# Patient Record
Sex: Female | Born: 1980 | Race: Black or African American | Hispanic: No | Marital: Married | Smoking: Current every day smoker
Health system: Southern US, Community
[De-identification: ages and names within clinical notes are randomized; demographics above are authoritative.]

## PROBLEM LIST (undated history)

## (undated) ENCOUNTER — Ambulatory Visit: Payer: Self-pay | Source: Home / Self Care

## (undated) DIAGNOSIS — B20 Human immunodeficiency virus [HIV] disease: Secondary | ICD-10-CM

## (undated) DIAGNOSIS — R87619 Unspecified abnormal cytological findings in specimens from cervix uteri: Secondary | ICD-10-CM

## (undated) DIAGNOSIS — F32A Depression, unspecified: Secondary | ICD-10-CM

## (undated) DIAGNOSIS — G629 Polyneuropathy, unspecified: Secondary | ICD-10-CM

## (undated) DIAGNOSIS — G43909 Migraine, unspecified, not intractable, without status migrainosus: Secondary | ICD-10-CM

## (undated) DIAGNOSIS — D219 Benign neoplasm of connective and other soft tissue, unspecified: Secondary | ICD-10-CM

## (undated) DIAGNOSIS — B1910 Unspecified viral hepatitis B without hepatic coma: Secondary | ICD-10-CM

## (undated) DIAGNOSIS — J45909 Unspecified asthma, uncomplicated: Secondary | ICD-10-CM

## (undated) DIAGNOSIS — F410 Panic disorder [episodic paroxysmal anxiety] without agoraphobia: Secondary | ICD-10-CM

## (undated) DIAGNOSIS — E559 Vitamin D deficiency, unspecified: Secondary | ICD-10-CM

## (undated) DIAGNOSIS — Z973 Presence of spectacles and contact lenses: Secondary | ICD-10-CM

## (undated) DIAGNOSIS — F329 Major depressive disorder, single episode, unspecified: Secondary | ICD-10-CM

## (undated) HISTORY — DX: Vitamin D deficiency, unspecified: E55.9

## (undated) HISTORY — DX: Unspecified viral hepatitis B without hepatic coma: B19.10

## (undated) HISTORY — DX: Migraine, unspecified, not intractable, without status migrainosus: G43.909

## (undated) HISTORY — PX: BREAST EXCISIONAL BIOPSY: SUR124

## (undated) HISTORY — DX: Major depressive disorder, single episode, unspecified: F32.9

## (undated) HISTORY — DX: Unspecified abnormal cytological findings in specimens from cervix uteri: R87.619

## (undated) HISTORY — DX: Panic disorder (episodic paroxysmal anxiety): F41.0

## (undated) HISTORY — DX: Human immunodeficiency virus (HIV) disease: B20

## (undated) HISTORY — PX: APPENDECTOMY: SHX54

## (undated) HISTORY — DX: Depression, unspecified: F32.A

## (undated) HISTORY — DX: Polyneuropathy, unspecified: G62.9

---

## 2003-07-30 DIAGNOSIS — B20 Human immunodeficiency virus [HIV] disease: Secondary | ICD-10-CM

## 2003-07-30 DIAGNOSIS — Z21 Asymptomatic human immunodeficiency virus [HIV] infection status: Secondary | ICD-10-CM

## 2003-07-30 HISTORY — DX: Asymptomatic human immunodeficiency virus (hiv) infection status: Z21

## 2003-07-30 HISTORY — DX: Human immunodeficiency virus (HIV) disease: B20

## 2008-11-11 DIAGNOSIS — B20 Human immunodeficiency virus [HIV] disease: Secondary | ICD-10-CM | POA: Insufficient documentation

## 2009-10-27 DIAGNOSIS — N920 Excessive and frequent menstruation with regular cycle: Secondary | ICD-10-CM | POA: Insufficient documentation

## 2010-01-25 DIAGNOSIS — F419 Anxiety disorder, unspecified: Secondary | ICD-10-CM | POA: Insufficient documentation

## 2010-01-25 DIAGNOSIS — F32A Depression, unspecified: Secondary | ICD-10-CM | POA: Insufficient documentation

## 2010-01-25 DIAGNOSIS — F329 Major depressive disorder, single episode, unspecified: Secondary | ICD-10-CM | POA: Insufficient documentation

## 2010-06-15 DIAGNOSIS — E559 Vitamin D deficiency, unspecified: Secondary | ICD-10-CM | POA: Insufficient documentation

## 2010-07-29 DIAGNOSIS — G629 Polyneuropathy, unspecified: Secondary | ICD-10-CM

## 2010-07-29 HISTORY — DX: Polyneuropathy, unspecified: G62.9

## 2010-08-15 DIAGNOSIS — R102 Pelvic and perineal pain unspecified side: Secondary | ICD-10-CM | POA: Insufficient documentation

## 2011-05-28 DIAGNOSIS — G629 Polyneuropathy, unspecified: Secondary | ICD-10-CM | POA: Insufficient documentation

## 2014-09-14 DIAGNOSIS — Z113 Encounter for screening for infections with a predominantly sexual mode of transmission: Secondary | ICD-10-CM | POA: Insufficient documentation

## 2014-12-14 DIAGNOSIS — F418 Other specified anxiety disorders: Secondary | ICD-10-CM | POA: Insufficient documentation

## 2015-01-11 DIAGNOSIS — F1210 Cannabis abuse, uncomplicated: Secondary | ICD-10-CM | POA: Insufficient documentation

## 2015-11-15 DIAGNOSIS — B1910 Unspecified viral hepatitis B without hepatic coma: Secondary | ICD-10-CM | POA: Insufficient documentation

## 2017-05-12 ENCOUNTER — Ambulatory Visit (INDEPENDENT_AMBULATORY_CARE_PROVIDER_SITE_OTHER): Payer: Self-pay | Admitting: Infectious Diseases

## 2017-05-12 ENCOUNTER — Encounter: Payer: Self-pay | Admitting: Infectious Diseases

## 2017-05-12 VITALS — BP 119/80 | HR 80 | Temp 97.7°F | Wt 129.0 lb

## 2017-05-12 DIAGNOSIS — E559 Vitamin D deficiency, unspecified: Secondary | ICD-10-CM

## 2017-05-12 DIAGNOSIS — F331 Major depressive disorder, recurrent, moderate: Secondary | ICD-10-CM

## 2017-05-12 DIAGNOSIS — B20 Human immunodeficiency virus [HIV] disease: Secondary | ICD-10-CM

## 2017-05-12 DIAGNOSIS — R768 Other specified abnormal immunological findings in serum: Secondary | ICD-10-CM

## 2017-05-12 DIAGNOSIS — Z113 Encounter for screening for infections with a predominantly sexual mode of transmission: Secondary | ICD-10-CM

## 2017-05-12 MED ORDER — AMITRIPTYLINE HCL 25 MG PO TABS: 25.0000 mg | ORAL_TABLET | Freq: Every day | ORAL | 5 refills | Status: DC

## 2017-05-12 MED ORDER — ELVITEG-COBIC-EMTRICIT-TENOFAF 150-150-200-10 MG PO TABS: 1.0000 | ORAL_TABLET | Freq: Every day | ORAL | 5 refills | Status: DC

## 2017-05-12 NOTE — Assessment & Plan Note (Addendum)
Will check resistance today including integrase as she has had 2 periods of time off medications. Will restart her Genvoya for now as she liked this medication regimen and seemed to be adherent when she was taking it.

## 2017-05-12 NOTE — Progress Notes (Signed)
Brief Narrative: Angelica Hopkins is a HIV + female that is here to transfer care for her infection. Previously she was treated at [facility] under the care of Angelica Hopkins, Angelica Hopkins. Originally diagnosed with HIV in 2005. Deferred treatment with ART x 5 years (denial). Re-engaged in care 09/2008; History of OIs: none. She is a 2nd grade teacher currently. Separated from her husband.   PCP - Patient, No Pcp Per    Patient Active Problem List   Diagnosis Date Noted  . Hepatitis B core antibody positive 11/15/2015  . Major depressive disorder, recurrent, moderate ([place]) 12/14/2014  . Neuropathy 05/28/2011  . Vitamin D deficiency 06/15/2010  . Excessive and frequent menstruation 10/27/2009  . HIV (human immunodeficiency virus infection) ([place]) 11/11/2008    Patient's Medications  New Prescriptions   ELVITEGRAVIR-COBICISTAT-EMTRICITABINE-TENOFOVIR (GENVOYA) 150-150-200-10 MG TABS TABLET    Take 1 tablet by mouth daily with breakfast. Try to take at the same time each day with small amount of food  Previous Medications   No medications on file  Modified Medications   Modified Medication Previous Medication   AMITRIPTYLINE (ELAVIL) 25 MG TABLET amitriptyline (ELAVIL) 25 MG tablet      Take 1 tablet (25 mg total) by mouth at bedtime.    Take 25 mg by mouth at bedtime.  Discontinued Medications   No medications on file    Subjective: Angelica Hopkins presents to clinic today to transfer care for her HIV infection.  HPI:  HIV =  Stopped taking Genvoya about June 2018 when she switched her job and insurance ran out. Previous regimen included Complera; also reports being on 3 pill regimen at some point. With original diagnosis she admits she was in a denial and waited until 2010 to get back on medications. History of female partners only, no IVDU. Was previously undetectable with labs from 05/2016 and 10/2016.   Chronic Hepatitis B Infection = + surface Ag and Core Ab 2009. Hep B DNA  undetectable in 2018. Denies fevers, abdominal pain, bloating, changes to skin/eyes, urine or stool. No tattoos. Reports her brother may have Hep B but does not think her mother has it.   Migraines = takes amitriptyline every 2-3 days as she is concerned she will lose insurance and have to stop taking this all together, which has happened in the past and sent her into a bad depression. Goody's BC powder are the only OTC aid that helps to abort episodes. Frequency is about 1x/week.   Depression/Anxiety = reports that this is relatively uncontrolled and could benefit from some 'talk therapy.' Previous provider prescribed her Buspar to which she did not want to take with amitriptyline for fear of side effects and never picked up. Smokes marijuana daily to deal with her stress.   Health Maintenance = She smokes cigarettes daily (1/3 ppd), smokes marijuana daily, does not drink excessive alcohol. No formal exercise. History of LSIL s/p colposcopy 07/2010 - negative paps since. In need of pap smear now.   Review of Systems: Review of Systems  Constitutional: Negative for chills, fever, malaise/fatigue and weight loss.  HENT: Negative for sore throat.        No dental problems  Respiratory: Negative for cough and sputum production.   Cardiovascular: Negative for chest pain and leg swelling.  Gastrointestinal: Negative for abdominal pain, diarrhea and vomiting.  Genitourinary: Negative for dysuria and flank pain.  Musculoskeletal: Negative for joint pain, myalgias and neck pain.  Skin: Negative for rash.  Neurological: Positive for headaches. Negative for dizziness and tingling.  Psychiatric/Behavioral: Positive for depression and substance abuse. The patient has insomnia. The patient is not nervous/anxious.     Past Medical History:  Diagnosis Date  . Abnormal Pap smear of cervix    LSISL, colposcopy 2012  . Depression   . Hepatitis B infection   . HIV (human immunodeficiency virus infection)  ([place])   . 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 Panic disorder   . Vitamin D deficiency     Social History  Substance Use Topics  . Smoking status: Current Every Day Smoker    Packs/day: 0.50  . Smokeless tobacco: Never Used  . Alcohol use No    Family History  Problem Relation Age of Onset  . Uterine cancer Maternal Aunt   . Lung cancer Maternal Grandmother     No Known Allergies  Objective:  Vitals:   05/12/17 1414  BP: 119/80  Pulse: 80  Temp: 97.7 F (36.5 C)  TempSrc: Oral  Weight: 129 lb (58.5 kg)   There is no height or weight on file to calculate BMI.  Physical Exam  Constitutional: She is oriented to person, place, and time and well-developed, well-nourished, and in no distress.  HENT:  Mouth/Throat: No oral lesions. No dental abscesses.  Cardiovascular: Normal rate, regular rhythm and normal heart sounds.   Pulmonary/Chest: Effort normal and breath sounds normal.  Abdominal: Soft. She exhibits no distension. There is no tenderness.  Musculoskeletal: Normal range of motion. She exhibits no tenderness.  Lymphadenopathy:    She has no cervical adenopathy.  Neurological: She is alert and oriented to person, place, and time.  Skin: Skin is warm and dry. No rash noted.  Psychiatric: Mood, affect and judgment normal.      Problem List Items Addressed This Visit      Other   Hepatitis B core antibody positive    No signs of active infection or flare since withdrawing HIV medications a few months ago. Check labs today including HepB eAg/Ab, LFTs and DNA viral load. Precautions given to prevent transmission if active infection.       HIV (human immunodeficiency virus infection) ([place]) - Primary    Will check resistance today including integrase as she has had 2 periods of time off medications. Will restart her Genvoya for now as she liked this medication regimen and seemed to be adherent when she was taking it.       Relevant Medications    elvitegravir-cobicistat-emtricitabine-tenofovir (GENVOYA) 150-150-200-10 MG TABS tablet   Other Relevant Orders   HIV RNA, RTPCR W/R GT (RTI, PI,INT)   T-helper cell (CD4)- ([facility] clinic only) (Completed)   RPR (Completed)   COMPLETE METABOLIC PANEL WITH GFR (Completed)   CBC with Differential/Platelet (Completed)   Lipid panel (Completed)   HLA B*5701   Quantiferon tb gold assay (blood)   hCG, quantitative, pregnancy (Completed)   Hepatitis B surface antigen (Completed)   Hepatitis B surface antibody (Completed)   Hepatitis A antibody, total (Completed)   Hepatitis C antibody (Completed)   Hepatitis B e antigen   Hepatitis B DNA, Ultraquantitative, PCR   Hepatitis B e antigen   Urinalysis, Routine w reflex microscopic (Completed)   Major depressive disorder, recurrent, moderate (HCC)    She seems to have had a significant treatment history and is self medicating significantly with many marijuana daily. Will have her meet with Judeen Hammans for talk therapy and recommendations. I feel she would be best  served at a psychiatrist after looking through her records. She is not interested in stopping or decreasing her marijuana use .      Relevant Medications   amitriptyline (ELAVIL) 25 MG tablet   Vitamin D deficiency    Check vitamin d level today. Not currently on therapy.       Relevant Orders   Vitamin D 1,25 dihydroxy    Other Visit Diagnoses    Screen for STD (sexually transmitted disease)       Relevant Orders   RPR (Completed)   Urine cytology ancillary only      Janene Madeira, MSN, NP-C [facility] for Infectious [place] Pager: [phone]  05/13/17 5:19 PM

## 2017-05-12 NOTE — Assessment & Plan Note (Signed)
Check vitamin d level today. Not currently on therapy.

## 2017-05-12 NOTE — Patient Instructions (Addendum)
Can take allegra, claritin or zyrtec for allergies. Use the "D" version if you are having congestion.   Getting you started back on Genvoya today - take this every day with full meal.   Labs today.   Please sign up with MyChart to access your labs and set up email communication with our clinic for non-urgent medical concerns.   Please set up appointment for pap smear - you can wait until your next appointment if you like.

## 2017-05-13 ENCOUNTER — Encounter: Payer: Self-pay | Admitting: Infectious Diseases

## 2017-05-13 LAB — URINALYSIS, ROUTINE W REFLEX MICROSCOPIC
Bilirubin Urine: NEGATIVE
Glucose, UA: NEGATIVE
Hgb urine dipstick: NEGATIVE
Ketones, ur: NEGATIVE
Leukocytes, UA: NEGATIVE
Nitrite: NEGATIVE
Protein, ur: NEGATIVE
Specific Gravity, Urine: 1.016 (ref 1.001–1.03)
pH: 6.5 (ref 5.0–8.0)

## 2017-05-13 LAB — T-HELPER CELL (CD4) - (RCID CLINIC ONLY)
CD4 % Helper T Cell: 26 % — ABNORMAL LOW (ref 33–55)
CD4 T Cell Abs: 520 /uL (ref 400–2700)

## 2017-05-13 NOTE — Assessment & Plan Note (Addendum)
She seems to have had a significant treatment history and is self medicating significantly with many marijuana daily. Will have her meet with Judeen Hammans for talk therapy and recommendations. I feel she would be best served at a psychiatrist after looking through her records. She is not interested in stopping or decreasing her marijuana use .

## 2017-05-13 NOTE — Assessment & Plan Note (Addendum)
No signs of active infection or flare since withdrawing HIV medications a few months ago. Check labs today including HepB eAg/Ab, LFTs and DNA viral load. Precautions given to prevent transmission if active infection.

## 2017-05-14 LAB — URINE CYTOLOGY ANCILLARY ONLY
Chlamydia: NEGATIVE
Neisseria Gonorrhea: NEGATIVE

## 2017-05-15 ENCOUNTER — Telehealth: Payer: Self-pay | Admitting: *Deleted

## 2017-05-15 NOTE — Telephone Encounter (Signed)
Per Colletta Maryland called the patient and scheduled her for Monday 05/19/17 at 4 pm. She was a little concerned but I assured her Monday was fine and Colletta Maryland just wants to make sure all her answers are answered and to give her the time she needs instead of over the phone interactions.

## 2017-05-15 NOTE — Telephone Encounter (Signed)
-----   Message from Garland Callas, NP sent at 05/15/2017  8:22 AM EDT ----- Can you please call her to schedule another appointment next week with me? Nothing urgent but it is too much to go over on the phone -  need to change her HIV medication, get further labs and discuss her hepatitis B infection as it has flared up. If you think she needs a call from me I will be happy to give her a call tomorrow afternoon. Thank you.

## 2017-05-15 NOTE — Telephone Encounter (Signed)
Excellent thank you for your kind and reassuring explanation.

## 2017-05-17 LAB — CBC WITH DIFFERENTIAL/PLATELET
Basophils Absolute: 9 cells/uL (ref 0–200)
Basophils Relative: 0.3 %
Eosinophils Absolute: 19 cells/uL (ref 15–500)
Eosinophils Relative: 0.6 %
HCT: 36 % (ref 35.0–45.0)
Hemoglobin: 11.8 g/dL (ref 11.7–15.5)
Lymphs Abs: 1832 cells/uL (ref 850–3900)
MCH: 27.3 pg (ref 27.0–33.0)
MCHC: 32.8 g/dL (ref 32.0–36.0)
MCV: 83.1 fL (ref 80.0–100.0)
MPV: 10.7 fL (ref 7.5–12.5)
Monocytes Relative: 7 %
Neutro Abs: 1023 cells/uL — ABNORMAL LOW (ref 1500–7800)
Neutrophils Relative %: 33 %
Platelets: 177 10*3/uL (ref 140–400)
RBC: 4.33 10*6/uL (ref 3.80–5.10)
RDW: 12.8 % (ref 11.0–15.0)
Total Lymphocyte: 59.1 %
WBC mixed population: 217 cells/uL (ref 200–950)
WBC: 3.1 10*3/uL — ABNORMAL LOW (ref 3.8–10.8)

## 2017-05-17 LAB — COMPLETE METABOLIC PANEL WITH GFR
AG Ratio: 1 (calc) (ref 1.0–2.5)
ALT: 52 U/L — ABNORMAL HIGH (ref 6–29)
AST: 70 U/L — ABNORMAL HIGH (ref 10–30)
Albumin: 4 g/dL (ref 3.6–5.1)
Alkaline phosphatase (APISO): 29 U/L — ABNORMAL LOW (ref 33–115)
BUN: 8 mg/dL (ref 7–25)
CO2: 28 mmol/L (ref 20–32)
Calcium: 9.1 mg/dL (ref 8.6–10.2)
Chloride: 103 mmol/L (ref 98–110)
Creat: 0.83 mg/dL (ref 0.50–1.10)
GFR, Est African American: 107 mL/min/{1.73_m2} (ref >=60)
GFR, Est Non African American: 93 mL/min/{1.73_m2} (ref >=60)
Globulin: 4.2 g/dL (calc) — ABNORMAL HIGH (ref 1.9–3.7)
Glucose, Bld: 81 mg/dL (ref 65–99)
Potassium: 4.1 mmol/L (ref 3.5–5.3)
Sodium: 137 mmol/L (ref 135–146)
Total Bilirubin: 0.4 mg/dL (ref 0.2–1.2)
Total Protein: 8.2 g/dL — ABNORMAL HIGH (ref 6.1–8.1)

## 2017-05-17 LAB — HEPATITIS B SURFACE ANTIBODY,QUALITATIVE: Hep B S Ab: NONREACTIVE

## 2017-05-17 LAB — LIPID PANEL
Cholesterol: 119 mg/dL (ref <200)
HDL: 45 mg/dL — ABNORMAL LOW (ref >50)
LDL Cholesterol (Calc): 58 mg/dL (calc)
Non-HDL Cholesterol (Calc): 74 mg/dL (calc) (ref <130)
Total CHOL/HDL Ratio: 2.6 (calc) (ref <5.0)
Triglycerides: 82 mg/dL (ref <150)

## 2017-05-17 LAB — HCG, QUANTITATIVE, PREGNANCY: HCG, Total, QN: <2 m[IU]/mL

## 2017-05-17 LAB — QUANTIFERON TB GOLD ASSAY (BLOOD)
Mitogen-Nil: >10 IU/mL
QUANTIFERON(R)-TB GOLD: NEGATIVE
Quantiferon Nil Value: 0.04 IU/mL
Quantiferon Tb Ag Minus Nil Value: 0 IU/mL

## 2017-05-17 LAB — HEPATITIS B SURFACE ANTIGEN: Hepatitis B Surface Ag: REACTIVE — AB

## 2017-05-17 LAB — VITAMIN D 1,25 DIHYDROXY
Vitamin D 1, 25 (OH)2 Total: 50 pg/mL (ref 18–72)
Vitamin D2 1, 25 (OH)2: <8 pg/mL
Vitamin D3 1, 25 (OH)2: 50 pg/mL

## 2017-05-17 LAB — HEPATITIS C ANTIBODY
Hepatitis C Ab: NONREACTIVE
SIGNAL TO CUT-OFF: 0.26 (ref <1.00)

## 2017-05-17 LAB — HEPATITIS B E ANTIGEN: Hep B E Ag: REACTIVE — AB

## 2017-05-17 LAB — HEPATITIS A ANTIBODY, TOTAL: Hepatitis A AB,Total: NONREACTIVE

## 2017-05-17 LAB — HLA B*5701: HLA-B*5701 w/rflx HLA-B High: NEGATIVE

## 2017-05-17 LAB — RPR: RPR Ser Ql: NONREACTIVE

## 2017-05-19 ENCOUNTER — Ambulatory Visit (INDEPENDENT_AMBULATORY_CARE_PROVIDER_SITE_OTHER): Payer: Self-pay | Admitting: Licensed Clinical Social Worker

## 2017-05-19 ENCOUNTER — Ambulatory Visit (INDEPENDENT_AMBULATORY_CARE_PROVIDER_SITE_OTHER): Payer: Self-pay | Admitting: Infectious Diseases

## 2017-05-19 ENCOUNTER — Encounter: Payer: Self-pay | Admitting: Infectious Diseases

## 2017-05-19 VITALS — BP 116/79 | HR 83 | Temp 97.6°F | Wt 128.0 lb

## 2017-05-19 DIAGNOSIS — B169 Acute hepatitis B without delta-agent and without hepatic coma: Secondary | ICD-10-CM

## 2017-05-19 DIAGNOSIS — F4321 Adjustment disorder with depressed mood: Secondary | ICD-10-CM

## 2017-05-19 DIAGNOSIS — IMO0002 Reserved for concepts with insufficient information to code with codable children: Secondary | ICD-10-CM

## 2017-05-19 DIAGNOSIS — B20 Human immunodeficiency virus [HIV] disease: Secondary | ICD-10-CM

## 2017-05-19 DIAGNOSIS — B180 Chronic viral hepatitis B with delta-agent: Secondary | ICD-10-CM

## 2017-05-19 DIAGNOSIS — F331 Major depressive disorder, recurrent, moderate: Secondary | ICD-10-CM

## 2017-05-19 DIAGNOSIS — Z915 Personal history of self-harm: Secondary | ICD-10-CM

## 2017-05-19 MED ORDER — BICTEGRAVIR-EMTRICITAB-TENOFOV 50-200-25 MG PO TABS: 1.0000 | ORAL_TABLET | Freq: Every day | ORAL | 5 refills | Status: DC

## 2017-05-19 NOTE — Assessment & Plan Note (Signed)
Counseled on lab results. VL >90k and CD4 520. No OI proph needed. Educated on interpretation of her lab results. With her side effects to Genvoya I will change her to [facility] and request Advancing Access to supply her with 19month of medications until ADAP approved. Tenofovir component will cover HBV co-infection. She has been counseled on this medication including s/e and daily use/adherence. She will come back in 6 weeks for blood work to assess VL/CD4.
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
## 2017-05-19 NOTE — Assessment & Plan Note (Signed)
Introduced her to [place] today. She will likely need alternative anti-anxiety medication as I have asked her to decrease marijuana use for her overall liver health. She is very concerned about this. Will continue to work with her and help with treatment. Advised PCP/MH provider for assistance - Given information re: [place] today as she does not have coverage to seek care elsewhere.

## 2017-05-19 NOTE — Assessment & Plan Note (Signed)
HepB DNA 188 million copies. LFT's elevated which has not been the case for her in the past. Likely she has had a flare in setting of medication lapse. TAF component in [place] will cover hep b. Counseled today in clinic and answered all questions regarding follow up plan.   With new elevated LFTs and knowledge her mother suffered from possible liver issues leading to her death will obtain complete abdominal US with elastography. Have asked her to be careful with blood (ie: menstrual pads/tampons), not share razors/toothbrushes, and condoms with sexual encounters as she is infectious currently. I have asked her to decrease marijuana use to help with liver health.

## 2017-05-19 NOTE — Patient Instructions (Signed)
Do not take the Genvoya. Will start you on Biktarvy once a day instead.   Common side effects include headaches and diarrhea - OK to take ibuprofen for headaches sparingly and imodium for diarrhea. Call/send me a MyChart message if this does not go away after a few weeks or it is too much to tolerate at school.   Will have you back as originally scheduled with labs 2 weeks before.   Will arrange for an ultrasound of your liver - we will call you with this information.

## 2017-05-21 ENCOUNTER — Encounter: Payer: Self-pay | Admitting: Infectious Diseases

## 2017-05-22 ENCOUNTER — Encounter: Payer: Self-pay | Admitting: Infectious Diseases

## 2017-05-22 ENCOUNTER — Other Ambulatory Visit: Payer: Self-pay | Admitting: Infectious Diseases

## 2017-05-22 DIAGNOSIS — G43819 Other migraine, intractable, without status migrainosus: Secondary | ICD-10-CM

## 2017-05-22 LAB — HIV-1 RNA ULTRAQUANT REFLEX TO GENTYP+
HIV 1 RNA Quant: 90800 Copies/mL — ABNORMAL HIGH
HIV-1 RNA Quant, Log: 4.96 Log cps/mL — ABNORMAL HIGH

## 2017-05-22 LAB — HEPATITIS B DNA, ULTRAQUANTITATIVE, PCR
Hepatitis B DNA (Calc): 8.27 Log IU/mL — ABNORMAL HIGH
Hepatitis B DNA: 188000000 IU/mL — ABNORMAL HIGH

## 2017-05-22 LAB — RFLX HIV-1 INTEGRASE GENOTYPE: HIV-1 Genotype: DETECTED — AB

## 2017-05-22 MED ORDER — METOCLOPRAMIDE HCL 10 MG PO TABS: 10.0000 mg | ORAL_TABLET | Freq: Three times a day (TID) | ORAL | 0 refills | Status: DC | PRN

## 2017-05-23 NOTE — BH Specialist Note (Signed)
Integrated Behavioral Health Initial Visit  MRN: [id number] Name: Angelica Hopkins  Number of [place] Clinician visits:: 1/6 Session Start time: 4:25 pm  Session End time: 4:53 pm Total time: 28 mins  Type of Service: [place] Interpretor:No. Interpretor Name and Language: N/A   Warm Hand Off Completed.       SUBJECTIVE: Angelica Hopkins is a 36 y.o. female accompanied by self Patient was referred by Angelica Hopkins for being a new referral.  Patient reports the following symptoms/concerns: Patient reported that she was diagnosed with HIV in 2005 and has been compliant with medications until her insurance stopped when she became unemployed.  Patient reported that she is depressed about dating and sharing her status.  Patient has history of self-harm and past suicide attempt.  Patient reported currently having urges to cut but uses her brother to "talk it out".  Additionally patient is using coping strategies such as squeezing her hand together to manage the intensity of the urges to cut.  Reported last cutting on arms three years ago.  Patient attempted suicide by overdose after she was diagnosed in 2005.  She drove herself to the ED, was admitted and released after three days.  Patient denied that there has been other suicide attempts since then.  Patient denied current specific suicidal ideations, plan or intent but reported general thoughts of death.  Patient has history of mental health treatment three years ago in [place] but stopped attending due to transportation issues.  Duration of problem: past year; Severity of problem: mild  OBJECTIVE: Mood: Depressed and Affect: Appropriate Risk of harm to self or others: No plan to harm self or others  LIFE CONTEXT: Family and Social: Patient is from [place], [place] and has traveled around.  Patient has family in [place] and has friends and co-workers here.  Patient was married for  10 years and has been separated for a year and a half.  Patient is working on becoming divorced.  Patient has a 33 year old daughter. School/Work: Patient is working Biochemist, clinical. Self-Care: Patient is able to tend to her ADL's. Life Changes: Patient moved to [facility] in August 2018  ASSESSMENT: Patient is currently experiencing difficulty adjusting to dating while separated and has thoughts of self-harm and may benefit from behavioral health services, medication management, and a support group.  GOALS ADDRESSED: Patient will: 1. Reduce symptoms of: depression and thoughts of self-mutilation 2. Increase knowledge and/or ability of: coping skills, healthy habits and self-management skills  3. Demonstrate ability to: Increase healthy adjustment to current life circumstances, Increase adequate support systems for patient/family and Manage painful emotions  INTERVENTIONS: Interventions utilized: Supportive Counseling   PLAN: 1. Follow up with behavioral health clinician on : Monday Oct 29th at 3:30 pm  Sande Rives, [facility]

## 2017-05-26 ENCOUNTER — Encounter: Payer: Self-pay | Admitting: Infectious Diseases

## 2017-05-26 ENCOUNTER — Emergency Department (HOSPITAL_COMMUNITY)
Admission: EM | Admit: 2017-05-26 | Discharge: 2017-05-26 | Disposition: A | Discharge status: Home / Self Care | Payer: Self-pay | Attending: Emergency Medicine | Admitting: Emergency Medicine

## 2017-05-26 ENCOUNTER — Encounter (HOSPITAL_COMMUNITY): Payer: Self-pay | Admitting: Emergency Medicine

## 2017-05-26 ENCOUNTER — Ambulatory Visit: Payer: Self-pay | Admitting: Licensed Clinical Social Worker

## 2017-05-26 DIAGNOSIS — Z79899 Other long term (current) drug therapy: Secondary | ICD-10-CM | POA: Insufficient documentation

## 2017-05-26 DIAGNOSIS — B20 Human immunodeficiency virus [HIV] disease: Secondary | ICD-10-CM | POA: Insufficient documentation

## 2017-05-26 DIAGNOSIS — M791 Myalgia, unspecified site: Secondary | ICD-10-CM | POA: Insufficient documentation

## 2017-05-26 DIAGNOSIS — R69 Illness, unspecified: Secondary | ICD-10-CM

## 2017-05-26 DIAGNOSIS — R110 Nausea: Secondary | ICD-10-CM | POA: Insufficient documentation

## 2017-05-26 DIAGNOSIS — R070 Pain in throat: Secondary | ICD-10-CM | POA: Insufficient documentation

## 2017-05-26 DIAGNOSIS — J111 Influenza due to unidentified influenza virus with other respiratory manifestations: Secondary | ICD-10-CM

## 2017-05-26 DIAGNOSIS — F17210 Nicotine dependence, cigarettes, uncomplicated: Secondary | ICD-10-CM | POA: Insufficient documentation

## 2017-05-26 DIAGNOSIS — R509 Fever, unspecified: Secondary | ICD-10-CM | POA: Insufficient documentation

## 2017-05-26 LAB — RAPID STREP SCREEN (MED CTR MEBANE ONLY): Streptococcus, Group A Screen (Direct): NEGATIVE

## 2017-05-26 LAB — INFLUENZA PANEL BY PCR (TYPE A & B)
Influenza A By PCR: NEGATIVE
Influenza B By PCR: NEGATIVE

## 2017-05-26 MED ORDER — ACETAMINOPHEN 325 MG PO TABS
650.0000 mg | ORAL_TABLET | Freq: Once | ORAL | Status: AC | PRN
  Administered 2017-05-26: 650 mg via ORAL

## 2017-05-26 MED ORDER — ACETAMINOPHEN 325 MG PO TABS
ORAL_TABLET | ORAL | Status: AC
  Filled 2017-05-26: qty 2

## 2017-05-26 MED ORDER — ONDANSETRON 4 MG PO TBDP: 4.0000 mg | ORAL_TABLET | Freq: Three times a day (TID) | ORAL | 0 refills | Status: DC | PRN

## 2017-05-26 MED ORDER — OSELTAMIVIR PHOSPHATE 75 MG PO CAPS: 75.0000 mg | ORAL_CAPSULE | Freq: Two times a day (BID) | ORAL | 0 refills | Status: DC

## 2017-05-26 NOTE — ED Notes (Signed)
Patient states that pain in her throat has not changed, but overall pain has decreased since arrival.  Says throat is still sore.

## 2017-05-26 NOTE — Discharge Instructions (Signed)
The rapid strep test was negative.  The flu test was negative, but there is a level of known error with this test.  Your symptoms are consistent with a viral illness. Viruses do not require antibiotics. Treatment is symptomatic care and it is important to note that these symptoms may last for 7-14 days.   Hand washing: Wash your hands throughout the day, but especially before and after touching the face, using the restroom, sneezing, coughing, or touching surfaces that have been coughed or sneezed upon. Hydration: Symptoms will be intensified and complicated by dehydration. Dehydration can also extend the duration of symptoms. Drink plenty of fluids and get plenty of rest. You should be drinking at least half a liter of water an hour to stay hydrated. Electrolyte drinks are also encouraged. You should be drinking enough fluids to make your urine light yellow, almost clear. If this is not the case, you are not drinking enough water. Please note that some of the treatments indicated below will not be effective if you are not adequately hydrated. Pain or fever: Ibuprofen, Naproxen, or Tylenol for pain or fever.  Nausea/vomiting: Use the Zofran for nausea or vomiting.  Congestion: Plain Mucinex may help relieve congestion. Saline sinus rinses and saline nasal sprays may also help relieve congestion. If you do not have heart problems or an allergy to such medications, you may also try phenylephrine or Sudafed. Sore throat: Warm liquids or Chloraseptic spray may help soothe a sore throat. Gargle twice a day with a salt water solution made from a half teaspoon of salt in a cup of warm water.  Tamiflu: Tamiflu is being prescribed as a precaution against influenza.  Take this medication until finished. Follow up: Follow up with a primary care provider, as needed, for any future management of this issue. Return: Return to the ED for worsening symptoms.

## 2017-05-26 NOTE — ED Provider Notes (Signed)
[facility] EMERGENCY DEPARTMENT Provider Note   CSN: [id number] Arrival date & time: 05/26/17  1047     History   Chief Complaint Chief Complaint  Patient presents with  . Generalized Body Aches  . Sore Throat    HPI Angelica Hopkins is a 36 y.o. female.  HPI   Angelica Hopkins is a 36 y.o. female, with a history of HIV, hepatitis B, and depression, presenting to the ED with sore throat, body aches, fever, chills, and nausea beginning yesterday.  Sore throat is sharp, bilateral, 7/10, nonradiating.  Took 1 dose of over-the-counter "cold medication" without complete relief.  Denies cough, shortness of breath, chest pain, difficulty swallowing, rash, neck pain or stiffness, vomiting, diarrhea, abdominal pain, or any other complaints.  States she has been compliant with her antiretrovirals.  Last CD4 count was 520 and HIV quant was 90,800, both on May 12, 2017. Under care of Janene Madeira, NP with last visit 10/15 with follow up on lab results 10/22.    Past Medical History:  Diagnosis Date  . Abnormal Pap smear of cervix    LSISL, colposcopy 2012  . Depression   . Hepatitis B infection   . HIV (human immunodeficiency virus infection) ([place])   . 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 Panic disorder   . Vitamin D deficiency     Patient Active Problem List   Diagnosis Date Noted  . Hepatitis B infection 11/15/2015  . Major depressive disorder, recurrent, moderate ([place]) 12/14/2014  . Neuropathy 05/28/2011  . Vitamin D deficiency 06/15/2010  . Excessive and frequent menstruation 10/27/2009  . HIV (human immunodeficiency virus infection) ([place]) 11/11/2008    Past Surgical History:  Procedure Laterality Date  . APPENDECTOMY      OB History    No data available       Home Medications    Prior to Admission medications   Medication Sig Start Date End Date Taking? Authorizing Provider  albuterol (PROVENTIL) (5 MG/ML) 0.5% nebulizer solution Inhale  2.5 mg into the lungs every 6 (six) hours as needed for shortness of breath or wheezing.   Yes [provider]  amitriptyline (ELAVIL) 25 MG tablet Take 1 tablet (25 mg total) by mouth at bedtime. 05/12/17  Yes [place] Callas, NP  metoCLOPramide (REGLAN) 10 MG tablet Take 1 tablet (10 mg total) by mouth every 8 (eight) hours as needed for nausea. 05/22/17  Yes [place] Callas, NP  Nutritional Supplements (COLD AND FLU) THERAPY PACK MISC Take 1 each by mouth every 6 (six) hours as needed (cold symptoms).   Yes [provider]  bictegravir-emtricitabine-tenofovir AF (BIKTARVY) 50-200-25 MG TABS tablet Take 1 tablet by mouth daily. Try to take at the same time each day with or without food. 05/19/17    Callas, NP  ondansetron (ZOFRAN ODT) 4 MG disintegrating tablet Take 1 tablet (4 mg total) by mouth every 8 (eight) hours as needed for nausea or vomiting. 05/26/17   Taitum Menton C, PA-C  oseltamivir (TAMIFLU) 75 MG capsule Take 1 capsule (75 mg total) by mouth every 12 (twelve) hours. 05/26/17   Lorayne Bender, PA-C    Family History Family History  Problem Relation Age of Onset  . Uterine cancer Maternal Aunt   . Lung cancer Maternal Grandmother   . Liver disease Mother   . HIV Mother   . Drug abuse Mother   . HIV Father   . Diabetes Father   .  Drug abuse Father   . Hepatitis B Brother     Social History Social History  Substance Use Topics  . Smoking status: Current Every Day Smoker    Packs/day: 0.30    Types: Cigarettes  . Smokeless tobacco: Never Used  . Alcohol use No     Allergies   Hydrocodone-acetaminophen   Review of Systems Review of Systems  Constitutional: Positive for chills and fever.  HENT: Positive for congestion and sore throat. Negative for trouble swallowing and voice change.   Respiratory: Negative for cough and shortness of breath.   Cardiovascular: Negative for chest pain.  Gastrointestinal: Positive for nausea. Negative  for abdominal pain, diarrhea and vomiting.  Musculoskeletal: Negative for neck pain and neck stiffness.  Skin: Negative for rash.  Neurological: Negative for dizziness, weakness and numbness.  All other systems reviewed and are negative.    Physical Exam Updated Vital Signs BP 113/89 (BP Location: Right Arm)   Pulse 98   Temp (!) 100.5 F (38.1 C) (Oral)   Resp 18   LMP 05/01/2017 (Approximate)   SpO2 100%   Physical Exam  Constitutional: She appears well-developed and well-nourished. No distress.  HENT:  Head: Normocephalic and atraumatic.  Mouth/Throat: Uvula is midline. Posterior oropharyngeal erythema present.  Eyes: Conjunctivae are normal.  Neck: Normal range of motion. Neck supple. No Brudzinski's sign and no Kernig's sign noted.  Cardiovascular: Normal rate, regular rhythm, normal heart sounds and intact distal pulses.   Pulmonary/Chest: Effort normal and breath sounds normal. No respiratory distress.  Abdominal: Soft. There is no tenderness. There is no guarding.  Musculoskeletal: She exhibits no edema.  Lymphadenopathy:    She has no cervical adenopathy.  Neurological: She is alert.  Skin: Skin is warm and dry. Capillary refill takes less than 2 seconds. She is not diaphoretic.  Psychiatric: She has a normal mood and affect. Her behavior is normal.  Nursing note and vitals reviewed.    ED Treatments / Results  Labs (all labs ordered are listed, but only abnormal results are displayed) Labs Reviewed  RAPID STREP SCREEN (NOT AT [facility])  CULTURE, GROUP A STREP [facility])  INFLUENZA PANEL BY PCR (TYPE A & B)    EKG  EKG Interpretation None       Radiology No results found.  Procedures Procedures (including critical care time)  Medications Ordered in ED Medications  acetaminophen (TYLENOL) tablet 650 mg (650 mg Oral Given 05/26/17 1134)     Initial Impression / Assessment and Plan / ED Course  I have reviewed the triage vital signs and the nursing  notes.  Pertinent labs & imaging results that were available during my care of the patient were reviewed by me and considered in my medical decision making (see chart for details).      Patient presents with sore throat, body aches, and fever.  Rapid strep negative.  Suspicion for possible influenza, despite negative influenza testing.  No meningismus.  Doubt sepsis.  Patient is nontoxic appearing, not tachycardic, not tachypneic, not hypotensive, maintains SPO2 of 100% on room air, and is in no apparent distress. The patient was given instructions for home care as well as return precautions. Patient voices understanding of these instructions, accepts the plan, and is comfortable with discharge.   Findings and plan of care discussed with Sherwood Gambler, MD.   Vitals:   05/26/17 1132 05/26/17 1534  BP: 113/89 (!) 105/58  Pulse: 98 88  Resp: 18 19  Temp: (!) 100.5 F (38.1 C) (  S) 98.6 F (37 C)  TempSrc: Oral Oral  SpO2: 100% 100%     Final Clinical Impressions(s) / ED Diagnoses   Final diagnoses:  Influenza-like illness    New Prescriptions New Prescriptions   ONDANSETRON (ZOFRAN ODT) 4 MG DISINTEGRATING TABLET    Take 1 tablet (4 mg total) by mouth every 8 (eight) hours as needed for nausea or vomiting.   OSELTAMIVIR (TAMIFLU) 75 MG CAPSULE    Take 1 capsule (75 mg total) by mouth every 12 (twelve) hours.     Lorayne Bender, PA-C 05/26/17 1538    Sherwood Gambler, MD 05/26/17 [phone]

## 2017-05-26 NOTE — ED Notes (Signed)
Patient Alert and oriented X4. Stable and ambulatory. Patient verbalized understanding of the discharge instructions.  Patient belongings were taken by the patient.  

## 2017-05-26 NOTE — ED Triage Notes (Signed)
Pt to ER for evaluation of generalized body aches, chills, and sore throat onset yesterday. Temp 100.5 at triage, denies taking any medication today for fever, states pain with swallowing, tonsils swollen and red.

## 2017-05-27 ENCOUNTER — Ambulatory Visit (HOSPITAL_COMMUNITY)
Admission: RE | Admit: 2017-05-27 | Discharge: 2017-05-27 | Disposition: A | Discharge status: Home / Self Care | Payer: Self-pay | Source: Ambulatory Visit | Attending: Infectious Diseases | Admitting: Infectious Diseases

## 2017-05-27 ENCOUNTER — Telehealth: Payer: Self-pay | Admitting: Licensed Clinical Social Worker

## 2017-05-27 DIAGNOSIS — B180 Chronic viral hepatitis B with delta-agent: Secondary | ICD-10-CM | POA: Insufficient documentation

## 2017-05-27 NOTE — Telephone Encounter (Signed)
[facility] called patient about missed appointment yesterday and was informed that she was in the hospital and sick with the flu.  Patient was barely able to talk and stated that she wanted to call back to schedule an appointment.  Sande Rives, [facility]

## 2017-05-28 LAB — CULTURE, GROUP A STREP (THRC)

## 2017-05-30 ENCOUNTER — Encounter: Payer: Self-pay | Admitting: Licensed Clinical Social Worker

## 2017-06-05 ENCOUNTER — Encounter: Payer: Self-pay | Admitting: Infectious Diseases

## 2017-06-05 ENCOUNTER — Other Ambulatory Visit: Payer: Self-pay | Admitting: *Deleted

## 2017-06-05 DIAGNOSIS — B20 Human immunodeficiency virus [HIV] disease: Secondary | ICD-10-CM

## 2017-06-05 DIAGNOSIS — B180 Chronic viral hepatitis B with delta-agent: Secondary | ICD-10-CM

## 2017-06-05 MED ORDER — BICTEGRAVIR-EMTRICITAB-TENOFOV 50-200-25 MG PO TABS: 1.0000 | ORAL_TABLET | Freq: Every day | ORAL | 5 refills | Status: DC

## 2017-06-23 ENCOUNTER — Other Ambulatory Visit: Payer: Self-pay

## 2017-06-23 DIAGNOSIS — B20 Human immunodeficiency virus [HIV] disease: Secondary | ICD-10-CM

## 2017-06-23 DIAGNOSIS — B169 Acute hepatitis B without delta-agent and without hepatic coma: Secondary | ICD-10-CM

## 2017-06-23 DIAGNOSIS — B180 Chronic viral hepatitis B with delta-agent: Secondary | ICD-10-CM

## 2017-06-24 ENCOUNTER — Telehealth: Payer: Self-pay | Admitting: *Deleted

## 2017-06-24 ENCOUNTER — Other Ambulatory Visit: Payer: Self-pay

## 2017-06-24 LAB — COMPREHENSIVE METABOLIC PANEL
AG Ratio: 0.8 (calc) — ABNORMAL LOW (ref 1.0–2.5)
ALT: 26 U/L (ref 6–29)
AST: 34 U/L — ABNORMAL HIGH (ref 10–30)
Albumin: 3.8 g/dL (ref 3.6–5.1)
Alkaline phosphatase (APISO): 33 U/L (ref 33–115)
BUN: 14 mg/dL (ref 7–25)
CO2: 28 mmol/L (ref 20–32)
Calcium: 9.4 mg/dL (ref 8.6–10.2)
Chloride: 104 mmol/L (ref 98–110)
Creat: 0.96 mg/dL (ref 0.50–1.10)
Globulin: 4.9 g/dL (calc) — ABNORMAL HIGH (ref 1.9–3.7)
Glucose, Bld: 93 mg/dL (ref 65–99)
Potassium: 4.6 mmol/L (ref 3.5–5.3)
Sodium: 137 mmol/L (ref 135–146)
Total Bilirubin: 0.3 mg/dL (ref 0.2–1.2)
Total Protein: 8.7 g/dL — ABNORMAL HIGH (ref 6.1–8.1)

## 2017-06-24 LAB — HEPATITIS B E ANTIGEN: Hep B E Ag: REACTIVE — AB

## 2017-06-24 LAB — EXTRA LAV TOP TUBE

## 2017-06-24 NOTE — Telephone Encounter (Signed)
I checked a serum pregnancy test when I first met her in clinic which was negative 1 month ago. I have not heard otherwise from the patient but will follow up with her when I see her back.   She does have a history of Hep B and it flared recently after being off ART for a year - I have her on treatment for both currently which should be in my clinic notes/lab result notes if they need documentation.   Thank you Kennyth Lose

## 2017-06-24 NOTE — Telephone Encounter (Signed)
Call from [facility] asking if this office had any notes pertaining to active Hep B or pregnancy on patient. Advised that patient is a new transfer and she just had labs yesterday and they have not resulted. Nothing in provider notes mentioning pregnancy. Gave the date of her next appt which is 07/11/17 Myrtis Hopping

## 2017-06-25 ENCOUNTER — Telehealth: Payer: Self-pay | Admitting: Licensed Clinical Social Worker

## 2017-06-25 LAB — HIV-1 RNA QUANT-NO REFLEX-BLD
HIV 1 RNA Quant: 51 copies/mL — ABNORMAL HIGH
HIV-1 RNA Quant, Log: 1.71 Log copies/mL — ABNORMAL HIGH

## 2017-06-25 LAB — HEPATITIS B DNA, ULTRAQUANTITATIVE, PCR
Hepatitis B DNA (Calc): 6.96 Log IU/mL — ABNORMAL HIGH
Hepatitis B DNA: 9060000 IU/mL — ABNORMAL HIGH

## 2017-06-25 LAB — T-HELPER CELL (CD4) - (RCID CLINIC ONLY)
CD4 % Helper T Cell: 27 % — ABNORMAL LOW (ref 33–55)
CD4 T Cell Abs: 700 /uL (ref 400–2700)

## 2017-06-25 NOTE — Telephone Encounter (Signed)
[facility] called patient and left message regarding scheduling an appointment.  Sande Rives, [facility]

## 2017-06-26 LAB — LIVER FIBROSIS, FIBROTEST-ACTITEST
ALT: 28 U/L (ref 6–29)
Alpha-2-Macroglobulin: 257 mg/dL (ref 106–279)
Apolipoprotein A1: 140 mg/dL (ref 101–198)
Bilirubin: 0.2 mg/dL (ref 0.2–1.2)
Fibrosis Score: 0.08
GGT: 16 U/L (ref 3–50)
Haptoglobin: 133 mg/dL (ref 43–212)
Necroinflammat ACT Score: 0.1
Reference ID: 2224567

## 2017-07-07 ENCOUNTER — Ambulatory Visit: Payer: Self-pay | Admitting: Infectious Diseases

## 2017-07-10 ENCOUNTER — Ambulatory Visit: Payer: Self-pay | Admitting: Infectious Diseases

## 2017-07-14 ENCOUNTER — Encounter: Payer: Self-pay | Admitting: Infectious Diseases

## 2017-07-15 ENCOUNTER — Encounter: Payer: Self-pay | Admitting: Infectious Diseases

## 2017-07-15 ENCOUNTER — Ambulatory Visit (INDEPENDENT_AMBULATORY_CARE_PROVIDER_SITE_OTHER): Payer: Self-pay | Admitting: Infectious Diseases

## 2017-07-15 VITALS — Temp 98.3°F | Ht 63.0 in | Wt 135.5 lb

## 2017-07-15 DIAGNOSIS — B181 Chronic viral hepatitis B without delta-agent: Secondary | ICD-10-CM

## 2017-07-15 DIAGNOSIS — Z8669 Personal history of other diseases of the nervous system and sense organs: Secondary | ICD-10-CM

## 2017-07-15 DIAGNOSIS — R42 Dizziness and giddiness: Secondary | ICD-10-CM

## 2017-07-15 DIAGNOSIS — B20 Human immunodeficiency virus [HIV] disease: Secondary | ICD-10-CM

## 2017-07-15 DIAGNOSIS — F331 Major depressive disorder, recurrent, moderate: Secondary | ICD-10-CM

## 2017-07-15 MED ORDER — MECLIZINE HCL 32 MG PO TABS: 32.0000 mg | ORAL_TABLET | Freq: Two times a day (BID) | ORAL | 1 refills | Status: DC
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
## 2017-07-15 NOTE — Patient Instructions (Signed)
Work on Academic librarian intake first to help with the dizziness. This will help give you adequate blood volume to hopefully improve your symptoms.   I do worry this is partially due to vertigo - will give you prescription for meclizine to see if this taken 1-2 times a day will help.   Continue your Biktarvy.   Will have you back in 3 months with labs 2 weeks before.   Have a good holiday

## 2017-07-18 ENCOUNTER — Encounter: Payer: Self-pay | Admitting: Infectious Diseases

## 2017-07-18 DIAGNOSIS — R42 Dizziness and giddiness: Secondary | ICD-10-CM | POA: Insufficient documentation

## 2017-07-18 DIAGNOSIS — G43909 Migraine, unspecified, not intractable, without status migrainosus: Secondary | ICD-10-CM | POA: Insufficient documentation

## 2017-07-18 NOTE — Assessment & Plan Note (Signed)
?  Vertigo component vs under-hydrating. Slight nystagmus noted during side to side movement during [facility] maneuvers however not reproducible with quick lowering of head.   Will give a trial BID meclizine to see if this helps but first would like her to increase water intake as well until urine is pale yellow to clear to ensure adequately hydrated.

## 2017-07-18 NOTE — Assessment & Plan Note (Addendum)
F2 some F3 on elastography. HBeAg (+).   HBV DNA and ALT improved since resuming Biktarvy. Will continue to monitor q3-57m. Counseled on risk for reactivation of HBV if she stops her HIV medications again.   Will need annual [place] screening with likely perinatal infection and potential for liver cancer/cirrhosis that contributed to her mother's death.

## 2017-07-18 NOTE — Assessment & Plan Note (Signed)
Continue daily amitriptyline.

## 2017-07-18 NOTE — Assessment & Plan Note (Signed)
Excellent suppression of VL with Biktarvy. Counseled regarding medication adherence today. Safe sex counseling provided. Condoms offered/declined. Will have her return in 3 months to reassess HIV VL and CMET.

## 2017-07-18 NOTE — Assessment & Plan Note (Signed)
Stable currently. Still with excessive marijuana use. Will continue to discuss readiness to quit and need for SA counseling.

## 2017-08-06 ENCOUNTER — Telehealth: Payer: Self-pay | Admitting: Licensed Clinical Social Worker

## 2017-08-06 NOTE — Telephone Encounter (Signed)
[facility] received phone call from patient asking to schedule an appointment to manage anxiety.  Patient explained that she works until 3:30 pm but assured the [facility] that she would be present for the appointment before 3:45 pm.  [facility] scheduled the appointment with the agreement from the patient that she will be present before 3:45 pm.  Sande Rives, [facility]

## 2017-08-07 ENCOUNTER — Ambulatory Visit (INDEPENDENT_AMBULATORY_CARE_PROVIDER_SITE_OTHER): Payer: Self-pay | Admitting: Licensed Clinical Social Worker

## 2017-08-07 DIAGNOSIS — F411 Generalized anxiety disorder: Secondary | ICD-10-CM

## 2017-08-07 DIAGNOSIS — IMO0002 Reserved for concepts with insufficient information to code with codable children: Secondary | ICD-10-CM

## 2017-08-07 DIAGNOSIS — Z915 Personal history of self-harm: Secondary | ICD-10-CM

## 2017-08-07 DIAGNOSIS — F4321 Adjustment disorder with depressed mood: Secondary | ICD-10-CM

## 2017-08-12 NOTE — BH Specialist Note (Signed)
Integrated Behavioral Health Follow Up Visit  MRN: [id number] Name: Angelica Hopkins  Number of [place] Clinician visits: 2/6 Session Start time: 3:38 pm  Session End time: 4:38 pm Total time: 1 hour  Type of Service: [place] Interpretor:No. Interpretor Name and Language: N/A  SUBJECTIVE: Angelica Hopkins is a 37 y.o. female accompanied by self Patient was referred by Angelica Hopkins for being a new referral.  Patient completed the PHQ-9 assessment and scored a 7 (mild symptoms) and reported last experiencing issues with depression in 2016.  Patient also completed the GAD-7 assessment and scored a 16, evidencing severe symptoms that are "very difficult to manage".  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   Patient reported having low frustration tolerance but is not asking for additional help when needed.  Patient reported that she may have unmanaged ADHD symptoms with racing thoughts and smokes Marijuana to manage.  Patient reported that she smokes Marijuana daily to manage and has a history of smoking up to 4 blunts a day while in school.  Patient reported that she took the following medications in 2016, Prozac, Zoloft, Adderall, and Cymbalta.  Patient reported that the antidepressants made her feel that she is "under water" and she discontinued.  Patient is currently taking Amitriptyline for pain.  Patient has history of attending behavioral health services and was receiving counseling on campus of school from 2015-summer 2016.  She also reported that she attended [facility] counseling but did not like receiving services there.  In reviewing her symptoms, patient reported having a family history of mental illness and stated that she has two sisters with Bipolar D/O.  Patient stated that she did not think that she met criteria, however was  willing to review criteria.  With the exception of racing thoughts, patient was able to deny the experience of Bipolar criteria.  However she reported extensive worry and met all 6 criteria for GAD, which were experienced since the summer of 2017.  Patient reported that she had to step down from a work position due to anxiety.  Additionally patient reported that she smokes Marijuana 2-3 times a day, started smoking at age 87, with no current desire to stop.  Patient denied being high the next day; denied lack of fulfilling obligations, giving up activities, or having recurrent problems; denied driving under the influence; and denied continued use despite having a problem.  Patient is unwilling to enter drug treatment at this time.  [facility] and patient discussed her current coping strategies and the role of untreated mental illness on functioning.  [facility] educated patient about the role of journaling in managing excessive worry and distressing emotions, and educated on some helpful ways to journal.  Patient was receptive to the use of journaling and reported that she is going to begin.  Duration of problem: past year; Severity of problem: moderate  OBJECTIVE: Mood: elevated and Affect: Appropriate Risk of harm to self or others: No plan to harm self or others  Thought Process: Tangential Thought Content: Logical  ASSESSMENT: Patient is currently experiencing episodes of frustration, with a low frustration tolerance, and excessive worry and anxiety.  Patient is also using Marijuana as a coping mechanism to manage untreated anxiety.  Patient may benefit from behavioral health services, medication management, and a support group.   GOALS ADDRESSED: Patient will: 1.  Reduce symptoms of: anxiety, depression and Marijuana use  2.  Increase knowledge and/or ability of: coping skills, healthy habits and self-management skills  3.  Demonstrate ability to: Increase healthy adjustment to current life  circumstances, Increase adequate support systems for patient/family and Manage painful and distressing emotions  INTERVENTIONS: Interventions utilized:  Supportive Counseling and Medication Monitoring Standardized Assessments completed: GAD-7 and PHQ 9   GAD 7 : Generalized Anxiety Score 08/07/2017  Nervous, Anxious, on Edge 3  Control/stop worrying 2  Worry too much - different things 2  Trouble relaxing 3  Restless 3  Easily annoyed or irritable 3  Afraid - awful might happen 0  Total GAD 7 Score 16  Anxiety Difficulty Very difficult   PLAN: 1. Follow up with behavioral health clinician on : Thurs Jan 17th at 3:30 pm 2. Behavioral recommendations: Begin journaling throughout the day, but at very least at the end of the day before sleep. 3. During next session will educate on additional anxiety coping strategies for irritability and low frustration tolerance.   Sande Rives, [facility]

## 2017-08-14 ENCOUNTER — Ambulatory Visit (INDEPENDENT_AMBULATORY_CARE_PROVIDER_SITE_OTHER): Payer: Self-pay | Admitting: Licensed Clinical Social Worker

## 2017-08-14 DIAGNOSIS — F411 Generalized anxiety disorder: Secondary | ICD-10-CM

## 2017-08-14 DIAGNOSIS — IMO0002 Reserved for concepts with insufficient information to code with codable children: Secondary | ICD-10-CM

## 2017-08-14 DIAGNOSIS — Z915 Personal history of self-harm: Secondary | ICD-10-CM

## 2017-08-14 DIAGNOSIS — F4321 Adjustment disorder with depressed mood: Secondary | ICD-10-CM

## 2017-08-18 NOTE — BH Specialist Note (Signed)
Integrated Behavioral Health Follow Up Visit  MRN: [id number] Name: Angelica Hopkins  Number of [place] Clinician visits: 3/6 Session Start time: 3:49 pm  Session End time: 4:37 pm Total time: 48 mins  Type of Service: [place] Interpretor:No. Interpretor Name and Language: N/A  SUBJECTIVE: Angelica Hopkins is a 37 y.o. female accompanied by self Patient was referred by Janene Madeira for being a new referral.  Patient provided an update on her progress with journaling and reported that she is doing when she can remember to perform, and it helps her feel better.  [facility] provided patient with an Anxiety worksheet and discussed things that trigger anxiety and differentiated from feelings of frustration or anger.  With assistance from the [facility], patient made a list of the physical symptoms experienced when she is anxious and of possible self-soothing actions.  Patient also discussed the thoughts experienced when anxious (all destructive) and again differentiated from frustration.  [facility] helped patient develop a list of three things to cope with anxiety while working.  Duration of problem: past year; Severity of problem: moderate  OBJECTIVE: Mood: Anxious and Affect: Appropriate Risk of harm to self or others: No plan to harm self or others  Thought Process: Circumstantial Thought Content: Logical  ASSESSMENT: Patient is currently experiencing episodes of frustration, with a low frustration tolerance, and excessive worry and anxiety.  Patient is experiencing some symptom relief and reduction in anxiety due to using learned coping strategies.  Patient has also been receptive to learning and trying new behaviors and has been receptive to change.  Patient may benefit from continued behavioral health services, medication management, drug treatment, and a support group.  GOALS ADDRESSED: Patient will: 1.  Reduce symptoms of: anxiety and  Marijuana use  2.  Increase knowledge and/or ability of: coping skills, healthy habits and self-management skills  3.  Demonstrate ability to: Increase healthy adjustment to current life circumstances, Increase adequate support systems for patient/family and Manage painful and distressing emotions  INTERVENTIONS: Interventions utilized:  Brief CBT  PLAN: 1. Follow up with behavioral health clinician on : Thurs Jan 31 at 3:30 pm in two weeks 2. Behavioral recommendations: Patient will begin to use learned coping strategies of ripping paper to release/transfer strong emotion and use visualization of a favorite concert to create happy emotions.  Patient will continue and increase use of journaling throughout the day and at the end of the day. 3. At next session will review Challenging Anxious Thoughts and Countering Anxiety worksheets.   Sande Rives, [facility]

## 2017-08-28 ENCOUNTER — Ambulatory Visit: Payer: Self-pay | Admitting: Licensed Clinical Social Worker

## 2017-09-16 ENCOUNTER — Other Ambulatory Visit: Payer: Self-pay

## 2017-09-16 DIAGNOSIS — B181 Chronic viral hepatitis B without delta-agent: Secondary | ICD-10-CM

## 2017-09-16 DIAGNOSIS — B20 Human immunodeficiency virus [HIV] disease: Secondary | ICD-10-CM

## 2017-09-16 LAB — COMPREHENSIVE METABOLIC PANEL
AG Ratio: 1.2 (calc) (ref 1.0–2.5)
ALT: 16 U/L (ref 6–29)
AST: 23 U/L (ref 10–30)
Albumin: 4.2 g/dL (ref 3.6–5.1)
Alkaline phosphatase (APISO): 30 U/L — ABNORMAL LOW (ref 33–115)
BUN: 14 mg/dL (ref 7–25)
CO2: 28 mmol/L (ref 20–32)
Calcium: 9.6 mg/dL (ref 8.6–10.2)
Chloride: 105 mmol/L (ref 98–110)
Creat: 1.06 mg/dL (ref 0.50–1.10)
Globulin: 3.6 g/dL (calc) (ref 1.9–3.7)
Glucose, Bld: 88 mg/dL (ref 65–99)
Potassium: 4.6 mmol/L (ref 3.5–5.3)
Sodium: 138 mmol/L (ref 135–146)
Total Bilirubin: 0.2 mg/dL (ref 0.2–1.2)
Total Protein: 7.8 g/dL (ref 6.1–8.1)

## 2017-09-18 LAB — HIV-1 RNA QUANT-NO REFLEX-BLD
HIV 1 RNA Quant: <20 copies/mL
HIV-1 RNA Quant, Log: <1.3 Log copies/mL

## 2017-09-19 LAB — HEPATITIS B DNA, ULTRAQUANTITATIVE, PCR
Hepatitis B DNA (Calc): 4.8 Log IU/mL — ABNORMAL HIGH
Hepatitis B DNA: 62500 IU/mL — ABNORMAL HIGH

## 2017-09-30 ENCOUNTER — Ambulatory Visit (INDEPENDENT_AMBULATORY_CARE_PROVIDER_SITE_OTHER): Payer: Self-pay | Admitting: Infectious Diseases

## 2017-09-30 ENCOUNTER — Encounter: Payer: Self-pay | Admitting: Infectious Diseases

## 2017-09-30 VITALS — BP 132/80 | HR 91 | Temp 98.1°F | Wt 134.0 lb

## 2017-09-30 DIAGNOSIS — B20 Human immunodeficiency virus [HIV] disease: Secondary | ICD-10-CM

## 2017-09-30 DIAGNOSIS — F3342 Major depressive disorder, recurrent, in full remission: Secondary | ICD-10-CM

## 2017-09-30 DIAGNOSIS — E559 Vitamin D deficiency, unspecified: Secondary | ICD-10-CM

## 2017-09-30 DIAGNOSIS — B169 Acute hepatitis B without delta-agent and without hepatic coma: Secondary | ICD-10-CM

## 2017-09-30 DIAGNOSIS — R42 Dizziness and giddiness: Secondary | ICD-10-CM

## 2017-09-30 DIAGNOSIS — Z8669 Personal history of other diseases of the nervous system and sense organs: Secondary | ICD-10-CM

## 2017-09-30 NOTE — Assessment & Plan Note (Signed)
Under good control. Continue amitriptyline daily.

## 2017-09-30 NOTE — Assessment & Plan Note (Signed)
Excellent control on Biktarvy. Will have her return in 6 months with labs prior to visit. Will have her set up with a pap visit with me at her convenience to screen for cervical cancer.

## 2017-09-30 NOTE — Assessment & Plan Note (Signed)
LFTs normalized. HB DNA reduced with resuming tenofovir AF
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
## 2017-09-30 NOTE — Patient Instructions (Addendum)
Great job on you medications   Please make an appointment for your pap smear with me at your convenience.   Please reschedule an appointment with me in 6 months with labs 2 weeks before.   You look great today!   We have a new counselor for you if you need to re-establish for some talk sessions.

## 2017-09-30 NOTE — Assessment & Plan Note (Signed)
Will assess level at next lab draw
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
## 2017-09-30 NOTE — Assessment & Plan Note (Signed)
Will set her up with our new counselor should she reach out. She is very good about acknowledging her mental health needs and will not hesitate to reach out.

## 2017-10-03 ENCOUNTER — Ambulatory Visit (INDEPENDENT_AMBULATORY_CARE_PROVIDER_SITE_OTHER): Payer: Self-pay | Admitting: Infectious Diseases

## 2017-10-03 DIAGNOSIS — Z124 Encounter for screening for malignant neoplasm of cervix: Secondary | ICD-10-CM

## 2017-10-03 NOTE — Progress Notes (Signed)
     Subjective:    Angelica Hopkins is a 37 y.o. female here for an annual pelvic exam and pap smear.   Review of Systems: Current GYN complaints or concerns: none.  Patient denies any abdominal/pelvic pain, problems with bowel movements, urination, vaginal discharge or intercourse.   Past Medical History:  Diagnosis Date  . Abnormal Pap smear of cervix    LSISL, colposcopy 2012  . Depression   . Hepatitis B infection   . HIV (human immunodeficiency virus infection) ([place])   . 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 Panic disorder   . Vitamin D deficiency     Gynecologic History: No obstetric history on file.  Patient's last menstrual period was 09/13/2017 (approximate). Contraception: abstinence Last Pap: uncertain. Results were: history of colposcopy with negative biopsy Anal Intercourse: no Last Mammogram: not applicable. Negative family history   Objective:  Physical Exam  Constitutional: Well developed, well nourished, no acute distress. She is alert and oriented x3.  Pelvic: External genitalia is normal in appearance. The vagina is normal in appearance. The cervix is bulbous and easily visualized. No CMT, normal expected cervical mucus present. Deferred bimanual exam per patient request.   Psych: She has a normal mood and affect.    Assessment:  Normal pelvic and bimanual exam. Thin prep pap was obtained and sent for cytology with reflex HPV and GC/C today. Normal clinical breast exam.   Plan:  Health Maintenance =   Return in 1 year for annual pap screening unless indicated sooner - will send MyChart with results.   She has been counseled and instructed how to perform monthly self breast exams.  Discussed timing of screening mammograms.   Contraception / Family Planning =   abstinence   If she desires pregnancy recommended to notify me to switch ART during 1st trimester and begin prenatal vitamin with 836 mcg folic acid - she does not want any further children.     HIV =   She will continue her Biktarvy and F/U as scheduled with me for ongoing HIV care.   Janene Madeira, MSN, NP-C [place] for Infectious [place] [facility] 10/03/2017 9:15 AM

## 2017-10-06 ENCOUNTER — Encounter: Payer: Self-pay | Admitting: Infectious Diseases

## 2017-10-06 ENCOUNTER — Other Ambulatory Visit: Payer: Self-pay | Admitting: Infectious Diseases

## 2017-10-06 DIAGNOSIS — A5901 Trichomonal vulvovaginitis: Secondary | ICD-10-CM

## 2017-10-06 LAB — CYTOLOGY - PAP
Adequacy: ABSENT
Chlamydia: NEGATIVE
Diagnosis: NEGATIVE
Neisseria Gonorrhea: NEGATIVE

## 2017-10-06 MED ORDER — METRONIDAZOLE 500 MG PO TABS: 500.0000 mg | ORAL_TABLET | Freq: Two times a day (BID) | ORAL | 0 refills | Status: AC

## 2017-10-06 NOTE — Progress Notes (Signed)
me

## 2017-10-15 ENCOUNTER — Encounter: Payer: Self-pay | Admitting: Infectious Diseases

## 2017-10-16 ENCOUNTER — Encounter: Payer: Self-pay | Admitting: Infectious Diseases

## 2017-10-16 ENCOUNTER — Other Ambulatory Visit: Payer: Self-pay | Admitting: Infectious Diseases

## 2017-10-16 DIAGNOSIS — H9319 Tinnitus, unspecified ear: Secondary | ICD-10-CM

## 2017-10-16 DIAGNOSIS — R42 Dizziness and giddiness: Secondary | ICD-10-CM

## 2017-10-22 ENCOUNTER — Encounter: Payer: Self-pay | Admitting: Infectious Diseases

## 2017-11-04 ENCOUNTER — Encounter: Payer: Self-pay | Admitting: Infectious Diseases

## 2017-11-21 ENCOUNTER — Other Ambulatory Visit: Payer: Self-pay | Admitting: Internal Medicine

## 2017-11-21 DIAGNOSIS — B180 Chronic viral hepatitis B with delta-agent: Secondary | ICD-10-CM

## 2017-11-21 DIAGNOSIS — B20 Human immunodeficiency virus [HIV] disease: Secondary | ICD-10-CM

## 2018-04-02 ENCOUNTER — Other Ambulatory Visit: Payer: BLUE CROSS/BLUE SHIELD

## 2018-04-02 DIAGNOSIS — B169 Acute hepatitis B without delta-agent and without hepatic coma: Secondary | ICD-10-CM

## 2018-04-02 DIAGNOSIS — Z21 Asymptomatic human immunodeficiency virus [HIV] infection status: Secondary | ICD-10-CM

## 2018-04-02 DIAGNOSIS — E559 Vitamin D deficiency, unspecified: Secondary | ICD-10-CM

## 2018-04-02 DIAGNOSIS — B20 Human immunodeficiency virus [HIV] disease: Secondary | ICD-10-CM

## 2018-04-03 LAB — T-HELPER CELL (CD4) - (RCID CLINIC ONLY)
CD4 % Helper T Cell: 37 % (ref 33–55)
CD4 T Cell Abs: 730 /uL (ref 400–2700)

## 2018-04-06 LAB — COMPREHENSIVE METABOLIC PANEL
AG Ratio: 1.2 (calc) (ref 1.0–2.5)
ALT: 69 U/L — ABNORMAL HIGH (ref 6–29)
AST: 74 U/L — ABNORMAL HIGH (ref 10–30)
Albumin: 4.4 g/dL (ref 3.6–5.1)
Alkaline phosphatase (APISO): 28 U/L — ABNORMAL LOW (ref 33–115)
BUN: 14 mg/dL (ref 7–25)
CO2: 25 mmol/L (ref 20–32)
Calcium: 9.4 mg/dL (ref 8.6–10.2)
Chloride: 101 mmol/L (ref 98–110)
Creat: 1.07 mg/dL (ref 0.50–1.10)
Globulin: 3.8 g/dL (calc) — ABNORMAL HIGH (ref 1.9–3.7)
Glucose, Bld: 96 mg/dL (ref 65–99)
Potassium: 4.1 mmol/L (ref 3.5–5.3)
Sodium: 134 mmol/L — ABNORMAL LOW (ref 135–146)
Total Bilirubin: 0.4 mg/dL (ref 0.2–1.2)
Total Protein: 8.2 g/dL — ABNORMAL HIGH (ref 6.1–8.1)

## 2018-04-06 LAB — CBC
HCT: 35.1 % (ref 35.0–45.0)
Hemoglobin: 11.7 g/dL (ref 11.7–15.5)
MCH: 28.2 pg (ref 27.0–33.0)
MCHC: 33.3 g/dL (ref 32.0–36.0)
MCV: 84.6 fL (ref 80.0–100.0)
MPV: 11.1 fL (ref 7.5–12.5)
Platelets: 205 10*3/uL (ref 140–400)
RBC: 4.15 10*6/uL (ref 3.80–5.10)
RDW: 12.6 % (ref 11.0–15.0)
WBC: 4.1 10*3/uL (ref 3.8–10.8)

## 2018-04-06 LAB — LIPID PANEL
Cholesterol: 123 mg/dL (ref <200)
HDL: 49 mg/dL — ABNORMAL LOW (ref >50)
LDL Cholesterol (Calc): 61 mg/dL (calc)
Non-HDL Cholesterol (Calc): 74 mg/dL (calc) (ref <130)
Total CHOL/HDL Ratio: 2.5 (calc) (ref <5.0)
Triglycerides: 53 mg/dL (ref <150)

## 2018-04-06 LAB — HEPATITIS B DNA, ULTRAQUANTITATIVE, PCR
Hepatitis B DNA (Calc): 3.31 Log IU/mL — ABNORMAL HIGH
Hepatitis B DNA: 2020 IU/mL — ABNORMAL HIGH

## 2018-04-06 LAB — VITAMIN D 1,25 DIHYDROXY
Vitamin D 1, 25 (OH)2 Total: 72 pg/mL (ref 18–72)
Vitamin D2 1, 25 (OH)2: <8 pg/mL
Vitamin D3 1, 25 (OH)2: 72 pg/mL

## 2018-04-06 LAB — HIV-1 RNA QUANT-NO REFLEX-BLD
HIV 1 RNA Quant: <20 copies/mL — AB
HIV-1 RNA Quant, Log: <1.3 Log copies/mL — AB

## 2018-04-06 LAB — RPR: RPR Ser Ql: NONREACTIVE

## 2018-04-13 ENCOUNTER — Telehealth: Payer: Self-pay | Admitting: Behavioral Health

## 2018-04-14 NOTE — Telephone Encounter (Signed)
She must have went to an urgent care outside the system. I am presuming she had a strep throat swab come back positive; if she is only on day 2 I would think she will have a noticeable improvement by tomorrow. As long as she is not having fevers or bad abdominal pain or very watery diarrhea I am OK with her taking Imodium over the counter per box directions to see if this improves for her.   If she continues with this I can switch her antibiotic to something more tolerable so she can work.

## 2018-04-14 NOTE — Telephone Encounter (Signed)
Called patient to get an update.  She states she has had 3 watery stools today and she is not able to tolerate solids.  She denies fever and abdominal pain.  Patient states she does not feel good.  She did not but the Imodium.  She states she does not have enough money to go back to Urgent care but I advised her to call the Urgent care to see if they can see her since this is an issue that started after an medication they prescribed.  [name] states she would call back to let us know the outcome.  She has an upcoming appointment with [facility] Thursday  04/16/2018. Pricilla Riffle RN

## 2018-04-14 NOTE — Telephone Encounter (Signed)
Angelica Hopkins called in stating she was prescribed Augmentin by an Urgent care for Tonsillitis.  She states Monday morning she developed diarrhea and had a total of 8 loose stools Monday and so far today Tuesday 2 loose stools.  Patient states she called the urgent care but they did would not change the medication.  Advised patient she could try otc imodium and she states she would try to buy some when she got a break today at work.  She states her throat remains sore but wanted to inform Angelica Hopkins to see if there is another alternative. Pricilla Riffle RN

## 2018-04-16 ENCOUNTER — Ambulatory Visit (INDEPENDENT_AMBULATORY_CARE_PROVIDER_SITE_OTHER): Payer: BLUE CROSS/BLUE SHIELD | Admitting: Infectious Diseases

## 2018-04-16 ENCOUNTER — Encounter: Payer: Self-pay | Admitting: Infectious Diseases

## 2018-04-16 VITALS — BP 109/74 | HR 80 | Temp 99.0°F | Wt 129.0 lb

## 2018-04-16 DIAGNOSIS — Z8669 Personal history of other diseases of the nervous system and sense organs: Secondary | ICD-10-CM

## 2018-04-16 DIAGNOSIS — J029 Acute pharyngitis, unspecified: Secondary | ICD-10-CM

## 2018-04-16 DIAGNOSIS — B181 Chronic viral hepatitis B without delta-agent: Secondary | ICD-10-CM | POA: Diagnosis not present

## 2018-04-16 DIAGNOSIS — Z21 Asymptomatic human immunodeficiency virus [HIV] infection status: Secondary | ICD-10-CM

## 2018-04-16 MED ORDER — AMITRIPTYLINE HCL 25 MG PO TABS
25.0000 mg | ORAL_TABLET | Freq: Every day | ORAL | 5 refills | Status: DC
Start: 2018-04-16 — End: 2018-08-13

## 2018-04-16 NOTE — Patient Instructions (Addendum)
Continue your Biktarvy once a day - your labs look great.   Will see you back in 6 months with labs prior to. We can do your pap smear at this visit too since it is due (30 min appointment).

## 2018-04-16 NOTE — Assessment & Plan Note (Signed)
Viral load undetectable and CD4 healthy. She is doing very well on therapy. Discussed U=U concept in addition to safe sex counseling and prevention of other STIs. Condoms offered/declined. Will continue Biktarvy for her and she can return in 6 months with labs prior to visit.
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
## 2018-04-16 NOTE — Assessment & Plan Note (Signed)
LFTs slightly elevated but DNA down to 2k copies form 188 million. Reinforced importance to never miss her Biktarvy as it is also treating her Hep B. Will continue to monitor this for her with repeat DNA, LFTs and eAg status.

## 2018-04-17 ENCOUNTER — Encounter: Payer: Self-pay | Admitting: Infectious Diseases

## 2018-04-17 DIAGNOSIS — J029 Acute pharyngitis, unspecified: Secondary | ICD-10-CM | POA: Insufficient documentation

## 2018-04-17 NOTE — Assessment & Plan Note (Signed)
She tells me she was negative with rapid strep and strep culture at [facility] visit (out of network). I asked her to please stop her antibiotic as she is taking it incorrectly, terrible associated diarrhea and likely viral etiology.

## 2018-04-17 NOTE — Assessment & Plan Note (Signed)
Very stable on current regimen. Refill her amitriptyline.

## 2018-08-04 ENCOUNTER — Emergency Department (HOSPITAL_COMMUNITY): Payer: BLUE CROSS/BLUE SHIELD

## 2018-08-04 ENCOUNTER — Emergency Department (HOSPITAL_COMMUNITY)
Admission: EM | Admit: 2018-08-04 | Discharge: 2018-08-04 | Disposition: A | Discharge status: Home / Self Care | Payer: BLUE CROSS/BLUE SHIELD | Attending: Emergency Medicine | Admitting: Emergency Medicine

## 2018-08-04 ENCOUNTER — Encounter (HOSPITAL_COMMUNITY): Payer: Self-pay | Admitting: *Deleted

## 2018-08-04 DIAGNOSIS — Z79899 Other long term (current) drug therapy: Secondary | ICD-10-CM | POA: Diagnosis not present

## 2018-08-04 DIAGNOSIS — J40 Bronchitis, not specified as acute or chronic: Secondary | ICD-10-CM

## 2018-08-04 DIAGNOSIS — Z21 Asymptomatic human immunodeficiency virus [HIV] infection status: Secondary | ICD-10-CM | POA: Diagnosis not present

## 2018-08-04 DIAGNOSIS — Z87891 Personal history of nicotine dependence: Secondary | ICD-10-CM | POA: Insufficient documentation

## 2018-08-04 DIAGNOSIS — R079 Chest pain, unspecified: Secondary | ICD-10-CM | POA: Diagnosis present

## 2018-08-04 LAB — POCT I-STAT TROPONIN I
Troponin i, poc: 0 ng/mL (ref 0.00–0.08)
Troponin i, poc: 0.01 ng/mL (ref 0.00–0.08)

## 2018-08-04 LAB — BASIC METABOLIC PANEL
Anion gap: 10 (ref 5–15)
BUN: 11 mg/dL (ref 6–20)
CO2: 22 mmol/L (ref 22–32)
Calcium: 9.5 mg/dL (ref 8.9–10.3)
Chloride: 103 mmol/L (ref 98–111)
Creatinine, Ser: 1.06 mg/dL — ABNORMAL HIGH (ref 0.44–1.00)
GFR calc Af Amer: >60 mL/min (ref >60)
GFR calc non Af Amer: >60 mL/min (ref >60)
Glucose, Bld: 95 mg/dL (ref 70–99)
Potassium: 3.8 mmol/L (ref 3.5–5.1)
Sodium: 135 mmol/L (ref 135–145)

## 2018-08-04 LAB — I-STAT BETA HCG BLOOD, ED (NOT ORDERABLE): I-stat hCG, quantitative: <5 m[IU]/mL (ref <5)

## 2018-08-04 LAB — CBC
HCT: 40.4 % (ref 36.0–46.0)
Hemoglobin: 13.1 g/dL (ref 12.0–15.0)
MCH: 28.7 pg (ref 26.0–34.0)
MCHC: 32.4 g/dL (ref 30.0–36.0)
MCV: 88.4 fL (ref 80.0–100.0)
Platelets: 234 10*3/uL (ref 150–400)
RBC: 4.57 MIL/uL (ref 3.87–5.11)
RDW: 13 % (ref 11.5–15.5)
WBC: 4.6 10*3/uL (ref 4.0–10.5)
nRBC: 0 % (ref 0.0–0.2)

## 2018-08-04 LAB — D-DIMER, QUANTITATIVE: D-Dimer, Quant: 0.39 ug/mL-FEU (ref 0.00–0.50)

## 2018-08-04 MED ORDER — SODIUM CHLORIDE 0.9 % IV BOLUS
1000.0000 mL | Freq: Once | INTRAVENOUS | Status: AC
  Administered 2018-08-04: 1000 mL via INTRAVENOUS

## 2018-08-04 MED ORDER — ALBUTEROL SULFATE HFA 108 (90 BASE) MCG/ACT IN AERS
1.0000 | INHALATION_SPRAY | Freq: Once | RESPIRATORY_TRACT | Status: AC
  Administered 2018-08-04: 1 via RESPIRATORY_TRACT
  Filled 2018-08-04: qty 6.7

## 2018-08-04 MED ORDER — AEROCHAMBER PLUS FLO-VU MEDIUM MISC
1.0000 | Freq: Once | Status: AC
  Administered 2018-08-04: 1
  Filled 2018-08-04: qty 1

## 2018-08-04 NOTE — Discharge Instructions (Addendum)
Return to ED for worsening symptoms, increased chest pain, shortness of breath, leg swelling, vomiting or coughing up blood.

## 2018-08-04 NOTE — ED Triage Notes (Signed)
Pt complains of chest pain and shortness of breath. Pt was diagnosed with hypokalemia 2 weeks ago and has taken potassium every day. Pain started at work, is worse when taking a deep breath.

## 2018-08-04 NOTE — ED Provider Notes (Signed)
[facility] DEPT Provider Note   CSN: [id number] Arrival date & time: 08/04/18  1310     History   Chief Complaint Chief Complaint  Patient presents with  . Chest Pain  . Shortness of Breath    HPI Angelica Hopkins is a 38 y.o. female with a past medical history of HIV, who presents to ED for evaluation of chest pain for the past 6 hours that began when she was sitting at work.  She is noted some increased shortness of breath, dyspnea on exertion since 07/28/2018.  She states that the chest pain was constant, pleuritic in nature.  It has gradually improved to a 5/10 since beginning at 7/10.  She was hospitalized in [place] 2 weeks ago for what appears to be a psych hold. States that "I was trippin because I didn't take my amitriptyline."  She was found to be hypokalemic, given IV potassium but not given any p.o.  She did take over-the-counter potassium pills instead.  She denies history of similar chest pain in the past.  States that she will be short of breath even with walking short distances such as to her car from the grocery store.  She denies any leg swelling, hemoptysis, fever, abdominal pain, vomiting, history of DVT, PE, MI.  She reports daily tobacco use but denies any alcohol or other drug use.  HPI  Past Medical History:  Diagnosis Date  . Abnormal Pap smear of cervix    LSISL, colposcopy 2012  . Depression   . Hepatitis B infection   . HIV (human immunodeficiency virus infection) ([place])   . 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 Panic disorder   . Vitamin D deficiency     Patient Active Problem List   Diagnosis Date Noted  . Pharyngitis 04/17/2018  . Postural dizziness 07/18/2017  . History of migraine headaches 07/18/2017  . Hepatitis B infection 11/15/2015  . Major depressive disorder, recurrent, in remission ([place]) 12/14/2014  . Neuropathy 05/28/2011  . Excessive and frequent menstruation 10/27/2009  . HIV (human immunodeficiency  virus infection) ([place]) 11/11/2008    Past Surgical History:  Procedure Laterality Date  . APPENDECTOMY       OB History   No obstetric history on file.      Home Medications    Prior to Admission medications   Medication Sig Start Date End Date Taking? Authorizing Provider  amitriptyline (ELAVIL) 25 MG tablet Take 1 tablet (25 mg total) by mouth at bedtime. 04/16/18  Yes Dixon, Melton Krebs, NP  BIKTARVY 50-200-25 MG TABS tablet TAKE 1 TABLET BY MOUTH DAILY WITH OR WITHOUT FOOD. TRY TO TAKE AT THE SAME TIME [facility] DAY Patient taking differently: Take 1 tablet by mouth daily.  11/21/17  Yes Carlyle Basques, MD  Multiple Vitamins-Minerals (MULTIVITAMIN ADULT PO) Take 1 tablet by mouth daily.   Yes [provider]  POTASSIUM PO Take 1 tablet by mouth daily.   Yes [provider]    Family History Family History  Problem Relation Age of Onset  . Uterine cancer Maternal Aunt   . Lung cancer Maternal Grandmother   . Liver disease Mother   . HIV Mother   . Drug abuse Mother   . HIV Father   . Diabetes Father   . Drug abuse Father   . Hepatitis B Brother     Social History Social History   Tobacco Use  . Smoking status: Former Smoker    Packs/day: 0.30  Types: Cigarettes    Last attempt to quit: 10/14/2017    Years since quitting: 0.8  . Smokeless tobacco: Never Used  . Tobacco comment: CONGRATULATED!!!  Substance Use Topics  . Alcohol use: No  . Drug use: Yes    Types: Marijuana     Allergies   Hydrocodone-acetaminophen   Review of Systems Review of Systems  Constitutional: Negative for appetite change, chills and fever.  HENT: Negative for ear pain, rhinorrhea, sneezing and sore throat.   Eyes: Negative for photophobia and visual disturbance.  Respiratory: Positive for shortness of breath. Negative for cough, chest tightness and wheezing.   Cardiovascular: Positive for chest pain. Negative for palpitations.  Gastrointestinal: Negative for  abdominal pain, blood in stool, constipation, diarrhea, nausea and vomiting.  Genitourinary: Negative for dysuria, hematuria and urgency.  Musculoskeletal: Negative for myalgias.  Skin: Negative for rash.  Neurological: Negative for dizziness, weakness and light-headedness.     Physical Exam Updated Vital Signs BP (!) 119/92 (BP Location: Right Arm)   Pulse 95   Temp 98.2 F (36.8 C) (Oral)   Resp (!) 22   Ht 5\' 3"  (1.6 m)   Wt 57.6 kg   LMP 07/25/2018   SpO2 100%   BMI 22.50 kg/m   Physical Exam Vitals signs and nursing note reviewed.  Constitutional:      General: She is not in acute distress.    Appearance: She is well-developed.  HENT:     Head: Normocephalic and atraumatic.     Nose: Nose normal.  Eyes:     General: No scleral icterus.       Left eye: No discharge.     Conjunctiva/sclera: Conjunctivae normal.  Neck:     Musculoskeletal: Normal range of motion and neck supple.  Cardiovascular:     Rate and Rhythm: Regular rhythm. Tachycardia present.     Heart sounds: Normal heart sounds. No murmur. No friction rub. No gallop.   Pulmonary:     Effort: Pulmonary effort is normal. No respiratory distress.     Breath sounds: Normal breath sounds.  Abdominal:     General: Bowel sounds are normal. There is no distension.     Palpations: Abdomen is soft.     Tenderness: There is no abdominal tenderness. There is no guarding.  Musculoskeletal: Normal range of motion.     Comments: No lower extremity edema, erythema or calf tenderness bilaterally.  Skin:    General: Skin is warm and dry.     Findings: No rash.  Neurological:     Mental Status: She is alert.     Motor: No abnormal muscle tone.     Coordination: Coordination normal.      ED Treatments / Results  Labs (all labs ordered are listed, but only abnormal results are displayed) Labs Reviewed  BASIC METABOLIC PANEL - Abnormal; Notable for the following components:      Result Value   Creatinine, Ser  1.06 (*)    All other components within normal limits  CBC  D-DIMER, QUANTITATIVE (NOT AT [facility])  I-STAT TROPONIN, ED  I-STAT BETA HCG BLOOD, ED ([facility], [facility], [facility] ONLY)  POCT I-STAT TROPONIN I  I-STAT BETA HCG BLOOD, ED (NOT ORDERABLE)  I-STAT TROPONIN, ED  POCT I-STAT TROPONIN I    EKG EKG Interpretation  Date/Time:  Tuesday August 04 2018 13:22:07 EST Ventricular Rate:  102 PR Interval:    QRS Duration: 90 QT Interval:  337 QTC Calculation: 444 R Axis:   70  Text Interpretation:  Sinus tachycardia with irregular rate RSR' in V1 or V2, right VCD or RVH No prior ECG for comparison.  No STEMI Confirmed by Antony Blackbird [phone]) on 08/04/2018 6:31:58 PM   Radiology Dg Chest 2 View  Result Date: 08/04/2018 CLINICAL DATA:  Chest pain.  Shortness of breath. EXAM: CHEST - 2 VIEW COMPARISON:  No prior. FINDINGS: Mediastinum and hilar structures are normal. Heart size normal. Mild peribronchial cuffing noted consistent bronchitis. No focal alveolar infiltrate. No pleural effusion or pneumothorax. IMPRESSION: Mild peribronchial cuffing suggesting mild bronchitis. No focal alveolar infiltrate. Electronically Signed   By: Marcello Moores  Register   On: 08/04/2018 13:50    Procedures Procedures (including critical care time)  Medications Ordered in ED Medications  sodium chloride 0.9 % bolus 1,000 mL (1,000 mLs Intravenous New Bag/Given 08/04/18 1915)  albuterol (PROVENTIL HFA;VENTOLIN HFA) 108 (90 Base) MCG/ACT inhaler 1 puff (has no administration in time range)  AEROCHAMBER PLUS FLO-VU MEDIUM MISC 1 each (has no administration in time range)     Initial Impression / Assessment and Plan / ED Course  I have reviewed the triage vital signs and the nursing notes.  Pertinent labs & imaging results that were available during my care of the patient were reviewed by me and considered in my medical decision making (see chart for details).     38 year old female with a past medical history of HIV currently  on Biktarvy who presents to ED for evaluation of pleuritic chest pain for the past 6 hours and increased shortness of breath, dyspnea on exertion for the past week.  She was psych hospitalized several weeks ago in [place] and was found to be hypokalemic.  She states that the chest pain began when she was sitting at her desk.  Denies any leg swelling, history of DVT, PE or MI.  She does note that she is restarted smoking cigarettes after the death of her father.  On exam, patient is tachycardic.  No lower extremity edema, erythema or calf tenderness that would concern me for DVT.  She is afebrile.  Abdomen is soft, nontender nondistended.  EKG shows sinus tachycardia.  CBC, BMP, hCG, troponin unremarkable.  Chest x-ray shows findings consistent with bronchitis.  Repeat troponin is negative.  Patient was given fluids with significant improvement in her symptoms.  Suspect that the symptoms could be due to bronchitis.  She has low risk by heart score, ruled out for PE with a negative d-dimer.  Patient does note she had a history of asthma in the past which was seasonal.  She does not have an inhaler at home.  We will provide her with albuterol as needed and follow-up with her PCP.  Advised to return to ED for any severe worsening symptoms.  Patient is hemodynamically stable, in NAD, and able to ambulate in the ED. Evaluation does not show pathology that would require ongoing emergent intervention or inpatient treatment. I explained the diagnosis to the patient. Pain has been managed and has no complaints prior to discharge. Patient is comfortable with above plan and is stable for discharge at this time. All questions were answered prior to disposition. Strict return precautions for returning to the ED were discussed. Encouraged follow up with PCP.    Portions of this note were generated with Lobbyist. Dictation errors may occur despite best attempts at proofreading.   Final Clinical  Impressions(s) / ED Diagnoses   Final diagnoses:  Bronchitis    ED Discharge Orders  None       Delia Heady, PA-C 08/04/18 1937    Tegeler, Gwenyth Allegra, MD 08/04/18 [phone]

## 2018-08-05 ENCOUNTER — Other Ambulatory Visit: Payer: Self-pay | Admitting: *Deleted

## 2018-08-05 DIAGNOSIS — B20 Human immunodeficiency virus [HIV] disease: Secondary | ICD-10-CM

## 2018-08-06 ENCOUNTER — Other Ambulatory Visit: Payer: BLUE CROSS/BLUE SHIELD

## 2018-08-13 ENCOUNTER — Other Ambulatory Visit: Payer: Self-pay | Admitting: *Deleted

## 2018-08-13 ENCOUNTER — Telehealth: Payer: Self-pay | Admitting: Infectious Diseases

## 2018-08-13 ENCOUNTER — Other Ambulatory Visit: Payer: BLUE CROSS/BLUE SHIELD

## 2018-08-13 ENCOUNTER — Ambulatory Visit (INDEPENDENT_AMBULATORY_CARE_PROVIDER_SITE_OTHER): Payer: BLUE CROSS/BLUE SHIELD | Admitting: Licensed Clinical Social Worker

## 2018-08-13 ENCOUNTER — Ambulatory Visit: Payer: BLUE CROSS/BLUE SHIELD | Admitting: Infectious Diseases

## 2018-08-13 DIAGNOSIS — N921 Excessive and frequent menstruation with irregular cycle: Secondary | ICD-10-CM

## 2018-08-13 DIAGNOSIS — B20 Human immunodeficiency virus [HIV] disease: Secondary | ICD-10-CM

## 2018-08-13 DIAGNOSIS — Z21 Asymptomatic human immunodeficiency virus [HIV] infection status: Secondary | ICD-10-CM

## 2018-08-13 DIAGNOSIS — Z8669 Personal history of other diseases of the nervous system and sense organs: Secondary | ICD-10-CM

## 2018-08-13 DIAGNOSIS — F4312 Post-traumatic stress disorder, chronic: Secondary | ICD-10-CM

## 2018-08-13 DIAGNOSIS — B181 Chronic viral hepatitis B without delta-agent: Secondary | ICD-10-CM

## 2018-08-13 MED ORDER — AMITRIPTYLINE HCL 10 MG PO TABS: 25.0000 mg | ORAL_TABLET | Freq: Every day | ORAL | 5 refills | Status: DC

## 2018-08-13 NOTE — Telephone Encounter (Signed)
I have decreased her amitriptyline to 10 mg QHS for her, new Rx has been sent.   For her labs, please draw (all orders reviewed and updated)  Hep B DNA Hep B e-antigen HIV RNA CD4  CMET CBC TSH panel  Beta-hCG  Iron panel   Thank you,  Colletta [place]

## 2018-08-13 NOTE — Telephone Encounter (Signed)
-----   Message from Reggy Eye, [place] sent at 08/13/2018  9:18 AM EST ----- Regarding: RE: Sick kid. Reschedule?  Hey, Spoke with her and she was not even on her way. She wanted you to know she is having periods every 2 weeks and bleeding heavy. She is tired and can not focus and she would like for you to decrease her dose of Amitriptyline. I asked her to contact her GYN in the mean time but that I would let you know what is going on with her. She will come today for labs and Rollene Fare and see you 09/03/18. Hope "kid" is ok!  Darnelle Maffucci ----- Message ----- From: Spofford Callas, NP Sent: 08/13/2018   8:08 AM EST To: Reggy Eye, CMA Subject: Sick kid. Reschedule?                          Would you mind trying to give Angelica Hopkins a call before her appt to let her know I have a sick kid and out today? She is on for an appt this morning to talk about stuff going on and not sure she wants to do that with a new provider. Would she has an appt with Rollene Fare to resume counseling I believe. Please extend my apologies to her. I know this spot was important to her.

## 2018-08-13 NOTE — Addendum Note (Signed)
Addended by: [place] Callas on: 08/13/2018 10:10 AM   Modules accepted: Orders

## 2018-08-14 LAB — HCG, QUANTITATIVE, PREGNANCY: HCG, Total, QN: <2 m[IU]/mL

## 2018-08-14 LAB — URINALYSIS
Bilirubin Urine: NEGATIVE
Glucose, UA: NEGATIVE
Ketones, ur: NEGATIVE
Nitrite: NEGATIVE
Specific Gravity, Urine: 1.03 (ref 1.001–1.03)
pH: <=5 (ref 5.0–8.0)

## 2018-08-14 LAB — T-HELPER CELL (CD4) - (RCID CLINIC ONLY)
CD4 % Helper T Cell: 41 % (ref 33–55)
CD4 T Cell Abs: 830 /uL (ref 400–2700)
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
## 2018-08-15 LAB — COMPLETE METABOLIC PANEL WITH GFR
AG Ratio: 1.2 (calc) (ref 1.0–2.5)
ALT: 67 U/L — ABNORMAL HIGH (ref 6–29)
AST: 56 U/L — ABNORMAL HIGH (ref 10–30)
Albumin: 4.2 g/dL (ref 3.6–5.1)
Alkaline phosphatase (APISO): 29 U/L — ABNORMAL LOW (ref 33–115)
BUN: 13 mg/dL (ref 7–25)
CO2: 28 mmol/L (ref 20–32)
Calcium: 9.3 mg/dL (ref 8.6–10.2)
Chloride: 104 mmol/L (ref 98–110)
Creat: 1.02 mg/dL (ref 0.50–1.10)
GFR, Est African American: 83 mL/min/{1.73_m2} (ref >=60)
GFR, Est Non African American: 71 mL/min/{1.73_m2} (ref >=60)
Globulin: 3.5 g/dL (calc) (ref 1.9–3.7)
Glucose, Bld: 83 mg/dL (ref 65–99)
Potassium: 3.7 mmol/L (ref 3.5–5.3)
Sodium: 138 mmol/L (ref 135–146)
Total Bilirubin: 0.4 mg/dL (ref 0.2–1.2)
Total Protein: 7.7 g/dL (ref 6.1–8.1)

## 2018-08-15 LAB — IRON,TIBC AND FERRITIN PANEL
%SAT: 26 % (calc) (ref 16–45)
Ferritin: 44 ng/mL (ref 16–154)
Iron: 70 ug/dL (ref 40–190)
TIBC: 271 mcg/dL (calc) (ref 250–450)

## 2018-08-15 LAB — CBC
HCT: 32 % — ABNORMAL LOW (ref 35.0–45.0)
Hemoglobin: 10.7 g/dL — ABNORMAL LOW (ref 11.7–15.5)
MCH: 29.1 pg (ref 27.0–33.0)
MCHC: 33.4 g/dL (ref 32.0–36.0)
MCV: 87 fL (ref 80.0–100.0)
MPV: 10.2 fL (ref 7.5–12.5)
Platelets: 228 10*3/uL (ref 140–400)
RBC: 3.68 10*6/uL — ABNORMAL LOW (ref 3.80–5.10)
RDW: 12.3 % (ref 11.0–15.0)
WBC: 4.5 10*3/uL (ref 3.8–10.8)

## 2018-08-15 LAB — HIV-1 RNA QUANT-NO REFLEX-BLD
HIV 1 RNA Quant: <20 copies/mL
HIV-1 RNA Quant, Log: <1.3 Log copies/mL

## 2018-08-15 LAB — HEPATITIS B E ANTIGEN: Hep B E Ag: REACTIVE — AB

## 2018-08-15 LAB — HEPATITIS B DNA, ULTRAQUANTITATIVE, PCR
Hepatitis B DNA (Calc): 2.81 Log IU/mL — ABNORMAL HIGH
Hepatitis B DNA: 652 IU/mL — ABNORMAL HIGH

## 2018-08-15 LAB — TSH: TSH: 3.7 mIU/L

## 2018-08-20 ENCOUNTER — Ambulatory Visit: Payer: BLUE CROSS/BLUE SHIELD | Admitting: Licensed Clinical Social Worker

## 2018-08-27 ENCOUNTER — Ambulatory Visit (INDEPENDENT_AMBULATORY_CARE_PROVIDER_SITE_OTHER): Payer: BLUE CROSS/BLUE SHIELD | Admitting: Licensed Clinical Social Worker

## 2018-08-27 DIAGNOSIS — F4312 Post-traumatic stress disorder, chronic: Secondary | ICD-10-CM

## 2018-08-28 NOTE — BH Specialist Note (Signed)
Integrated Behavioral Health Follow Up Visit  MRN: [id number] Name: Angelica Hopkins  Number of [place] Clinician visits: 2/6 Session Start time: 4:00pm  Session End time: 4:40pm Total time: 40 minutes  Type of Service: [place] Interpretor:No. Interpretor Name and Language: n/a  SUBJECTIVE: Angelica Hopkins is a 38 y.o. female accompanied by self Patient reports the following symptoms/concerns: anxiety about various topics, inability to sleep, grief/crying spells, nightmares, fatigue, racing thoughts  OBJECTIVE: Mood: Anxious and Affect: Appropriate Risk of harm to self or others: No plan to harm self or others  LIFE CONTEXT: Patient reports that all areas of her life have become more stressful. She is not sleeping more than an hour or two per night, she is too tired to do much in the evenings and has been getting sick easily. Because of the time she took off for her father's passing, hospitalizations, and recent illnesses she has no more time off for the remainder of the year. Although her principal is understanding, the district has told patient that she will lose her job if she misses more work. Currently, she has a headache and sore throat and is worried about becoming sick.  GOALS ADDRESSED: Patient will: 1.  Reduce symptoms of: anxiety  INTERVENTIONS: Interventions utilized:  Solution-Focused Strategies and Supportive Counseling  ASSESSMENT: Patient currently experiencing insomnia, anxiety (about health, job, relationships), nightmares/fitful sleep, grief and crying spells when thinking of her father, intrusive thoughts she cannot slow or distract herself from. Counselor and patient processed the various stressors she is experiencing, and decided to focus on sleep as the priority to build a foundation for dealing with stress. Patient states that she has Trazadone for sleep, but does not take it during the week for fear  that she will oversleep for school and has forgotten to take it when her when she was away for the weekend a few times. She indicates that she sometimes uses marijuana to get to sleep, but her desire is to stop smoking and see if there is any effect on her mood. Counselor and patient explored these and other options, and brainstormed alternatives. Counselor guided patient to discuss how grief for her father is impacting her. Patient stated that she has not really had time to grieve, and that sometimes it just comes out when she remembers that her father, also her best friend, is not there anymore. Counselor educated patient on scheduled grieving time, and encouraged her to find a way to [employer] and honor father in these moments. Patient agreed to try new sleep routine and scheduled grief before next session.  Patient may benefit from ongoing weekly CBT sessions.  PLAN: 1. Follow up with behavioral health clinician on :  09/03/18   Lillie Fragmin, LCSW

## 2018-09-03 ENCOUNTER — Ambulatory Visit (INDEPENDENT_AMBULATORY_CARE_PROVIDER_SITE_OTHER): Payer: BLUE CROSS/BLUE SHIELD | Admitting: Infectious Diseases

## 2018-09-03 ENCOUNTER — Ambulatory Visit (INDEPENDENT_AMBULATORY_CARE_PROVIDER_SITE_OTHER): Payer: BLUE CROSS/BLUE SHIELD | Admitting: Licensed Clinical Social Worker

## 2018-09-03 ENCOUNTER — Encounter: Payer: Self-pay | Admitting: Infectious Diseases

## 2018-09-03 ENCOUNTER — Encounter: Payer: BLUE CROSS/BLUE SHIELD | Admitting: Infectious Diseases

## 2018-09-03 VITALS — BP 117/83 | HR 109 | Temp 99.1°F | Wt 123.0 lb

## 2018-09-03 DIAGNOSIS — B181 Chronic viral hepatitis B without delta-agent: Secondary | ICD-10-CM | POA: Diagnosis not present

## 2018-09-03 DIAGNOSIS — Z21 Asymptomatic human immunodeficiency virus [HIV] infection status: Secondary | ICD-10-CM | POA: Diagnosis not present

## 2018-09-03 DIAGNOSIS — F4312 Post-traumatic stress disorder, chronic: Secondary | ICD-10-CM

## 2018-09-03 DIAGNOSIS — N920 Excessive and frequent menstruation with regular cycle: Secondary | ICD-10-CM

## 2018-09-03 DIAGNOSIS — B20 Human immunodeficiency virus [HIV] disease: Secondary | ICD-10-CM

## 2018-09-03 DIAGNOSIS — F418 Other specified anxiety disorders: Secondary | ICD-10-CM

## 2018-09-03 DIAGNOSIS — G43909 Migraine, unspecified, not intractable, without status migrainosus: Secondary | ICD-10-CM

## 2018-09-03 MED ORDER — AMITRIPTYLINE HCL 10 MG PO TABS: 10.0000 mg | ORAL_TABLET | Freq: Every day | ORAL | 3 refills | Status: DC

## 2018-09-03 MED ORDER — HYDROXYZINE HCL 25 MG PO TABS: 25.0000 mg | ORAL_TABLET | Freq: Three times a day (TID) | ORAL | 0 refills | Status: DC | PRN
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
## 2018-09-03 NOTE — Patient Instructions (Signed)
Will decrease your amytryptiline to the lowest dose 10 mg daily.   Will send in hydroxyzine for nerves - this can be taken 3 times a day if needed but can cause some sleepiness.  Please continue to work regularly with Rollene Fare.   Can come back to see me again in 3 months if you are comfortable with that. Happy to see you again sooner if you need me.

## 2018-09-05 MED ORDER — BICTEGRAVIR-EMTRICITAB-TENOFOV 50-200-25 MG PO TABS: 1.0000 | ORAL_TABLET | Freq: Every day | ORAL | 11 refills | Status: DC

## 2018-09-05 NOTE — Assessment & Plan Note (Signed)
Pregnancy tests negative x 2. She was mildly anemic with hgb 10.7 (baseline ~11.5). ?if stress was precipitating factor for prolonged bleed. I asked her to notify me again if this happens - may need to consider OCPs vs depo injection and referral to GYN team.  Pap due in March 2020.

## 2018-09-05 NOTE — Assessment & Plan Note (Addendum)
Lab Results  Component Value Date   HEPBSAG REACTIVE (A) 05/12/2017   Lab Results  Component Value Date   ALT 67 (H) 08/13/2018   AST 56 (H) 08/13/2018   BILITOT 0.4 08/13/2018   Labs in January with ongoing positive e-antigen; fortunately viral load continues to come down and now 600 on TAF therapy. Will continue to assess q42m LFTs and for seroconversion.  Needs HCC ultrasound screens > 36 yo

## 2018-09-05 NOTE — Assessment & Plan Note (Addendum)
She will continue to work with Rollene Fare as often as she can. Will decrease her Elavil as she requests and see how she feels with adjustment.  Can continue Trazodone PRN for sleep - she will see what dose works for her.  Will trial hydroxyzine 25mg  TID PRN to help with anxiety. May need to switch to SSRI for maintenance.

## 2018-09-05 NOTE — Assessment & Plan Note (Signed)
Reinforced to her today that CD4 counts fluctuate and are a snapshot for all that is contributing to immune function (other illnesses, surgery, stress). I brought her attention to the fact that for proper interpretation we need the context of both the VL and CD4 - she continues to be well suppressed on her Biktarvy regimen with undetectable levels. She was extremely relieved in hearing this.  Will continue this for her.

## 2018-09-05 NOTE — Assessment & Plan Note (Signed)
Decrease amitryptiline to 10 mg QD. If we need to start treating her depression with alternative will offer daily topamax in substitution.

## 2018-09-07 NOTE — BH Specialist Note (Signed)
Integrated Behavioral Health Follow Up Visit  MRN: [id number] Name: Angelica Hopkins  Number of [place] Clinician visits: 3/6 Session Start time: 4:30pm  Session End time: 5:10pm Total time: 40 minutes  Type of Service: [place] Interpretor:No. Interpretor Name and Language: n/a  SUBJECTIVE: Angelica Hopkins is a 38 y.o. female accompanied by self Patient reports the following symptoms/concerns: anxiety, sleeplessness, worry thoughts, depressed mood, flat affect, crying spells  OBJECTIVE: Mood: Anxious and Affect: Depressed Risk of harm to self or others: No plan to harm self or others  LIFE CONTEXT: Patient reports that she talked with principal and has taken a couple of days off work. She indicates that she has not been sleeping, despite taking her Trazadone, and that she is having trouble remembering and functioning. Patient states that boyfriend is very supportive, but has been away on work and she is not sure how much longer before he will get back.   GOALS ADDRESSED: Patient will: 1.  Reduce symptoms of: anxiety and depression    INTERVENTIONS: Interventions utilized:  Brief CBT and Supportive Counseling  ASSESSMENT: Patient currently experiencing insomnia, high levels of anxiety, depressed mood, flat affect, intrusive thoughts, difficulty remembering, crying spells, fatigue and low motivation. Counselor explored with patient her journal, which she left for counselor to read after last session. Patient processed her experiences reflected in the journal. She was able to identify that she was disoriented and unsure what was happening while hospitalized, as reflected in her writing, and that although she is still struggling now she is not in that place any longer. Counselor commended patient for recognizing this progress, and emphasized that she is not in that place any longer because she has been working hard to make  changes and feel better. Counselor guided patient to discuss her thoughts and feelings now. Patient identified that anxiety is her main emotion, and it involves her job, her relationships with boyfriend and family members, and history of trauma. She was able to identify ways that she has countered her intrusive thoughts with positive self-talk. Counselor and patient brainstormed ways that patient's recent actions are a result of these thoughts and feelings. Patient and counselor explored ways to address her intrusive thoughts. Patient acknowledged that lots of her memories are incomplete and she sometimes doubts that they are real. Counselor emphasized the importance of experiencing rather than judging memories and emotions.  Patient may benefit from continued weekly sessions of CBT and Reality Therapy.  PLAN: 1. Follow up with behavioral health clinician on : 09/10/18 @ [place], LCSW

## 2018-09-08 ENCOUNTER — Ambulatory Visit: Payer: BLUE CROSS/BLUE SHIELD | Admitting: Licensed Clinical Social Worker

## 2018-09-10 ENCOUNTER — Ambulatory Visit: Payer: BLUE CROSS/BLUE SHIELD | Admitting: Licensed Clinical Social Worker

## 2018-09-15 ENCOUNTER — Other Ambulatory Visit: Payer: Self-pay | Admitting: Internal Medicine

## 2018-09-15 DIAGNOSIS — B20 Human immunodeficiency virus [HIV] disease: Secondary | ICD-10-CM

## 2018-10-09 DIAGNOSIS — J4521 Mild intermittent asthma with (acute) exacerbation: Secondary | ICD-10-CM

## 2018-10-09 DIAGNOSIS — F418 Other specified anxiety disorders: Secondary | ICD-10-CM

## 2018-10-11 ENCOUNTER — Other Ambulatory Visit: Payer: Self-pay

## 2018-10-11 DIAGNOSIS — F418 Other specified anxiety disorders: Secondary | ICD-10-CM

## 2018-10-12 MED ORDER — ALBUTEROL SULFATE HFA 108 (90 BASE) MCG/ACT IN AERS: 1.0000 | INHALATION_SPRAY | Freq: Four times a day (QID) | RESPIRATORY_TRACT | 3 refills | Status: DC | PRN

## 2018-10-12 MED ORDER — HYDROXYZINE HCL 25 MG PO TABS: 25.0000 mg | ORAL_TABLET | Freq: Three times a day (TID) | ORAL | 3 refills | Status: DC | PRN

## 2018-10-12 NOTE — Telephone Encounter (Signed)
Sent. Please cosign. Thank you! Landis Gandy, RN   OK to send in fill on her albuterol with 3 refills.     OK to send in refill on her hydroxyzine with 3 refills - I do want to have her continue seeing Rollene Fare; I know how helpful their talk sessions are for [place] and see she does not have any sessions scheduled for this month.     Thank you!

## 2018-10-27 ENCOUNTER — Encounter: Payer: Self-pay | Admitting: Infectious Diseases

## 2018-10-27 ENCOUNTER — Other Ambulatory Visit (HOSPITAL_COMMUNITY)
Admission: RE | Admit: 2018-10-27 | Discharge: 2018-10-27 | Disposition: A | Discharge status: Home / Self Care | Payer: BLUE CROSS/BLUE SHIELD | Source: Ambulatory Visit | Attending: Infectious Diseases | Admitting: Infectious Diseases

## 2018-10-27 ENCOUNTER — Other Ambulatory Visit: Payer: Self-pay

## 2018-10-27 ENCOUNTER — Ambulatory Visit (INDEPENDENT_AMBULATORY_CARE_PROVIDER_SITE_OTHER): Payer: BLUE CROSS/BLUE SHIELD | Admitting: Infectious Diseases

## 2018-10-27 VITALS — BP 126/82 | HR 82 | Temp 98.6°F | Wt 129.0 lb

## 2018-10-27 DIAGNOSIS — G43909 Migraine, unspecified, not intractable, without status migrainosus: Secondary | ICD-10-CM

## 2018-10-27 DIAGNOSIS — B181 Chronic viral hepatitis B without delta-agent: Secondary | ICD-10-CM

## 2018-10-27 DIAGNOSIS — Z124 Encounter for screening for malignant neoplasm of cervix: Secondary | ICD-10-CM | POA: Insufficient documentation

## 2018-10-27 DIAGNOSIS — N939 Abnormal uterine and vaginal bleeding, unspecified: Secondary | ICD-10-CM

## 2018-10-27 DIAGNOSIS — B20 Human immunodeficiency virus [HIV] disease: Secondary | ICD-10-CM | POA: Diagnosis not present

## 2018-10-27 DIAGNOSIS — N920 Excessive and frequent menstruation with regular cycle: Secondary | ICD-10-CM

## 2018-10-27 LAB — POCT URINE PREGNANCY: Preg Test, Ur: NEGATIVE

## 2018-10-27 MED ORDER — MEGESTROL ACETATE 20 MG PO TABS: 20.0000 mg | ORAL_TABLET | Freq: Three times a day (TID) | ORAL | 1 refills | Status: DC | PRN

## 2018-10-27 NOTE — Progress Notes (Signed)
Subjective:    Royale Lennartz is a 38 y.o. female here for an annual pelvic exam and pap smear.  She would like to discuss frequent episodes of uterine bleeding, getting off her amitriptyline, preconception counseling.  HPI: Current GYN complaints or concerns: Since January of this year Melondy has experienced 2 episodes of prolonged heavy menstrual bleeding that it occurs at short intervals.  She estimates that time between cycles has been 19 days and reports having to use 2 pads at a time to collect vaginal discharge.  She denies extensive clot formation.  She does report having postural dizziness.  She tells me that in the past she was diagnosed with uterine fibroids during pregnancy.  Up until January of this year her cycles were normal.  She and her husband are back together and trying to have a second child and not actively using any contraception method.   ROS: 12 point review of systems otherwise negative.   Past Medical History:  Diagnosis Date  . Abnormal Pap smear of cervix    LSISL, colposcopy 2012  . Depression   . Hepatitis B infection   . HIV (human immunodeficiency virus infection) ([place])   . 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 Panic disorder   . Vitamin D deficiency     Gynecologic History: No obstetric history on file.  Patient's last menstrual period was 10/20/2018. Contraception: none Last Pap: 09/2017. Results were: normal Anal Intercourse: No Last Mammogram: not indicated.  Objective:  Physical Exam  Constitutional: Well developed, well nourished, no acute distress. She is alert and oriented x3.  Pelvic: External genitalia is normal in appearance. The vagina is normal in appearance. The cervix is bulbous and easily visualized. No CMT, normal expected cervical mucus present. Bimanual exam reveals uterus that is felt to be large for size, spherical in shape, and contour. No adnexal masses or tenderness noted. Breasts: symmetrical in contour, shape and  texture. No palpable masses/nodules. No nipple discharge.  Psych: She has a normal mood and affect.     Assessment:  Enlarged uterus with abnormal uterine bleeding Migraines  Pre-conception counseling HIV, well controlled Chronic Hep B infection   Plan:   Problem List Items Addressed This Visit      Unprioritized   Human immunodeficiency virus (HIV) disease ([place])    With Linetta's desire to achieve pregnancy we discussed alternative options for HIV medications that are safe during early pregnancy.  I gave her the option of starting Odefsey once daily with meals or Isentress plus Descovy once daily.  She will send me a message after reading about the 2 and let me know which 1 she would like to try.   Return in about 3 months (around 01/26/2019) for follow up.       Hepatitis B infection    Currently on therapy with tenofovir. She will be due for repeat hep B viral load and surface antigen in July of this year.      Excessive and frequent menstruation    Exam is consistent for uterine fibroid.  Will check urine pregnancy test to ensure that she has no intrauterine pregnancy today.  History of fibroids in the past, likely needs vaginal ultrasound.  We will place GYN referral today.  Considering she is symptomatic for her bleeding episodes we discussed initiation of Megace therapy 20 mg 3 times daily for 7 days at onset of heavy menstruation.  She is hesitant on any contraceptive management for this  due to desire for pregnancy.      Migraines    Marlowe would like to wean off of her amitriptyline.  We discussed an approach that will allow gentle withdrawal of this.  We can consider alternative preventative medications if she needs versus neurology referral.  She has done well so far with dose reduction to 12-1/2 mg daily.      Screening for cervical cancer    Discussed recommended screening interval for women living with HIV disease. Will continue annual screenings for now with  consideration to increase interval to q35yr pending further discussions. Will call with results or send MyChart.       Relevant Orders   Cytology - PAP( [place])   POCT urine pregnancy (Completed)    Other Visit Diagnoses    Abnormal uterine bleeding    -  Primary   Relevant Orders   Ambulatory referral to Obstetrics / Gynecology   POCT urine pregnancy (Completed)     Janene Madeira, MSN, NP-C [facility] for [place] Office: [phone] Pager: [phone]  10/27/18 5:19 PM

## 2018-10-27 NOTE — Assessment & Plan Note (Addendum)
Currently on therapy with tenofovir. She will be due for repeat hep B viral load and surface antigen in July of this year.

## 2018-10-27 NOTE — Patient Instructions (Addendum)
We will try to get you enrolled with gynecology team here locally to help with your bleeding.  I suspect that you have a uterine fibroid that is causing her symptoms.  I will send in a medication called Megace for you this is to be taken 1 pill 3 times a day at the onset of the heavy menstrual cycle.  You should continue this 3 times a day for 7 days to stop and reduce the bleeding.  It would be best for you not to smoke cigarettes while you take this medication as it can increase your risk for blood clots.  Start taking folic acid 782 micrograms once daily.  For your HIV medication I would recommend we switch to [facility] (1 pill once a day with food) or start you on 2 separate pills once a day (Isentress an Descovy).  The Isentress and Descovy would be most similar to the [place] and do not have a food requirement.  For your amitriptyline I would take 1 pill every other day for 1 week prior to stopping.  If your migraines return we can consider additional medication.   Iron supplements may be needed to help correct your levels but see below for iron rich diet.  Cooking in a cast iron skillet is helpful.    Iron-Rich Diet  Iron is a mineral that helps your body to produce hemoglobin. Hemoglobin is a protein in red blood cells that carries oxygen to your body's tissues. Eating too little iron may cause you to feel weak and tired, and it can increase your risk of infection. Iron is naturally found in many foods, and many foods have iron added to them (iron-fortified foods). You may need to follow an iron-rich diet if you do not have enough iron in your body due to certain medical conditions. The amount of iron that you need each day depends on your age, your sex, and any medical conditions you have. Follow instructions from your health care provider or a diet and nutrition specialist (dietitian) about how much iron you should eat each day. What are tips for following this plan? Reading food  labels  Check food labels to see how many milligrams (mg) of iron are in each serving. Cooking  Cook foods in pots and pans that are made from iron.  Take these steps to make it easier for your body to absorb iron from certain foods: ? Soak beans overnight before cooking. ? Soak whole grains overnight and drain them before using. ? Ferment flours before baking, such as by using yeast in bread dough. Meal planning  When you eat foods that contain iron, you should eat them with foods that are high in vitamin C. These include oranges, peppers, tomatoes, potatoes, and mango. Vitamin C helps your body to absorb iron. General information  Take iron supplements only as told by your health care provider. An overdose of iron can be life-threatening. If you were prescribed iron supplements, take them with orange juice or a vitamin C supplement.  When you eat iron-fortified foods or take an iron supplement, you should also eat foods that naturally contain iron, such as meat, poultry, and fish. Eating naturally iron-rich foods helps your body to absorb the iron that is added to other foods or contained in a supplement.  Certain foods and drinks prevent your body from absorbing iron properly. Avoid eating these foods in the same meal as iron-rich foods or with iron supplements. These foods include: ? Coffee, black tea, and  red wine. ? Milk, dairy products, and foods that are high in calcium. ? Beans and soybeans. ? Whole grains. What foods should I eat? Fruits Prunes. Raisins. Eat fruits high in vitamin C, such as oranges, grapefruits, and strawberries, alongside iron-rich foods. Vegetables Spinach (cooked). Green peas. Broccoli. Fermented vegetables. Eat vegetables high in vitamin C, such as leafy greens, potatoes, bell peppers, and tomatoes, alongside iron-rich foods. Grains Iron-fortified breakfast cereal. Iron-fortified whole-wheat bread. Enriched rice. Sprouted grains. Meats and other  proteins Beef liver. Oysters. Beef. Shrimp. [place]. Chicken. [place]. Sardines. Chickpeas. Nuts. Tofu. Pumpkin seeds. Beverages Tomato juice. Fresh orange juice. Prune juice. Hibiscus tea. Fortified instant breakfast shakes. Sweets and desserts Blackstrap molasses. Seasonings and condiments Tahini. Fermented soy sauce. Other foods Wheat germ. The items listed above may not be a complete list of recommended foods and beverages. Contact a dietitian for more information. What foods should I avoid? Grains Whole grains. Bran cereal. Bran flour. Oats. Meats and other proteins Soybeans. Products made from soy protein. Black beans. Lentils. Mung beans. Split peas. Dairy Milk. Cream. Cheese. Yogurt. Cottage cheese. Beverages Coffee. Black tea. Red wine. Sweets and desserts Cocoa. Chocolate. Ice cream. Other foods Basil. Oregano. Large amounts of parsley. The items listed above may not be a complete list of foods and beverages to avoid. Contact a dietitian for more information. Summary  Iron is a mineral that helps your body to produce hemoglobin. Hemoglobin is a protein in red blood cells that carries oxygen to your body's tissues.  Iron is naturally found in many foods, and many foods have iron added to them (iron-fortified foods).  When you eat foods that contain iron, you should eat them with foods that are high in vitamin C. Vitamin C helps your body to absorb iron.  Certain foods and drinks prevent your body from absorbing iron properly, such as whole grains and dairy products. You should avoid eating these foods in the same meal as iron-rich foods or with iron supplements. This information is not intended to replace advice given to you by your health care provider. Make sure you discuss any questions you have with your health care provider. Document Released: 02/26/2005 Document Revised: 06/10/2017 Document Reviewed: 06/10/2017 Elsevier Interactive Patient Education  2019 [employer].

## 2018-10-27 NOTE — Assessment & Plan Note (Signed)
Exam is consistent for uterine fibroid.  Will check urine pregnancy test to ensure that she has no intrauterine pregnancy today.  History of fibroids in the past, likely needs vaginal ultrasound.  We will place GYN referral today.  Considering she is symptomatic for her bleeding episodes we discussed initiation of Megace therapy 20 mg 3 times daily for 7 days at onset of heavy menstruation.  She is hesitant on any contraceptive management for this due to desire for pregnancy.

## 2018-10-27 NOTE — Assessment & Plan Note (Addendum)
Angelica Hopkins would like to wean off of her amitriptyline.  We discussed an approach that will allow gentle withdrawal of this.  We can consider alternative preventative medications if she needs versus neurology referral.  She has done well so far with dose reduction to 12-1/2 mg daily.

## 2018-10-27 NOTE — Assessment & Plan Note (Signed)
With [name]'s desire to achieve pregnancy we discussed alternative options for HIV medications that are safe during early pregnancy.  I gave her the option of starting Odefsey once daily with meals or Isentress plus Descovy once daily.  She will send me a message after reading about the 2 and let me know which 1 she would like to try.   Return in about 3 months (around 01/26/2019) for follow up.

## 2018-10-27 NOTE — Assessment & Plan Note (Signed)
Discussed recommended screening interval for women living with HIV disease. Will continue annual screenings for now with consideration to increase interval to q39yr pending further discussions. Will call with results or send MyChart.

## 2018-11-02 ENCOUNTER — Telehealth: Payer: Self-pay | Admitting: Behavioral Health

## 2018-11-02 LAB — CYTOLOGY - PAP
Chlamydia: NEGATIVE
HPV 16/18/45 genotyping: POSITIVE — AB
HPV: DETECTED — AB
Neisseria Gonorrhea: NEGATIVE
Trichomonas: NEGATIVE

## 2018-11-02 NOTE — Telephone Encounter (Signed)
-----   Message from Lahaina Callas, NP sent at 11/02/2018  1:39 PM EDT ----- Please call Angelica Hopkins to inform her that her pap smear results returned abnormal. I want to refer her to gynecology for a colposcopy to better understand these changes. If she has further questions please encourage her to send me a MyChart so I can discuss with her personally.  Thank you team.

## 2018-11-02 NOTE — Telephone Encounter (Signed)
Patient returned RN's call. Was able to confirm patient before disclosing lab results. Patient states she will send Janene Madeira, NP questions regarding pap results through my chart. [place]

## 2018-11-02 NOTE — Telephone Encounter (Signed)
Called patient, left voicemail to call the office back.  Left the call back number. Pricilla Riffle RN

## 2018-11-02 NOTE — Progress Notes (Signed)
Please call Angelica Hopkins to inform her that her pap smear results returned abnormal. I want to refer her to gynecology for a colposcopy to better understand these changes. If she has further questions please encourage her to send me a MyChart so I can discuss with her personally.  Thank you team.

## 2018-11-27 ENCOUNTER — Telehealth: Payer: Self-pay | Admitting: Pharmacy Technician

## 2018-11-27 NOTE — Telephone Encounter (Signed)
Ms. [name] left a message at the clinic stating she lost her insurance and is not working due to [employer].  She asked how she can get her Biktarvy medication.  I called her back and got a 30 day voucher through [place] which she will use to fill her medication today.  She will call Beryle Lathe Monday to ask for help in applying for HMAP.  She has Kathy's phone number.  BIN#: [id number] PCN: [id number] GROUP: [id number] MEMBER#: Advancing Access [id number]  Angelica Hopkins. [name] Patient [facility] Phone: [phone] Fax:  [phone]

## 2018-11-30 ENCOUNTER — Encounter: Payer: Self-pay | Admitting: Infectious Diseases

## 2018-12-01 ENCOUNTER — Telehealth: Payer: Self-pay | Admitting: Pharmacy Technician

## 2018-12-01 NOTE — Telephone Encounter (Signed)
[facility] Patient Advocate Encounter  Completed and sent [employer] Advancing Access application for [place] for this patient who is uninsured.    Patient is approved 11/27/2018 through 11/27/2019.  BIN      [id number] PCN    [id number] GRP    [id number] ID        [id number]   Inez Catalina E. Nadara Mustard Fowler Patient [facility] for [facility] Phone: [phone] Fax:  [phone]

## 2018-12-03 ENCOUNTER — Ambulatory Visit (INDEPENDENT_AMBULATORY_CARE_PROVIDER_SITE_OTHER): Payer: Self-pay | Admitting: Obstetrics and Gynecology

## 2018-12-03 ENCOUNTER — Other Ambulatory Visit: Payer: Self-pay

## 2018-12-03 ENCOUNTER — Encounter: Payer: Self-pay | Admitting: Obstetrics and Gynecology

## 2018-12-03 DIAGNOSIS — R87611 Atypical squamous cells cannot exclude high grade squamous intraepithelial lesion on cytologic smear of cervix (ASC-H): Secondary | ICD-10-CM

## 2018-12-03 DIAGNOSIS — N939 Abnormal uterine and vaginal bleeding, unspecified: Secondary | ICD-10-CM

## 2018-12-03 MED ORDER — MEDROXYPROGESTERONE ACETATE 5 MG PO TABS: 10.0000 mg | ORAL_TABLET | Freq: Every day | ORAL | 3 refills | Status: DC

## 2018-12-03 NOTE — Progress Notes (Signed)
   [facility]LE[facility]ALTH [facility] GYNECOLOGY VISIT ENCOUNTER NOTE  I connected with Angelica Hopkins on 12/03/18 at 11:00 AM EDT by WebEx at home and verified that I am speaking with the correct person using two identifiers.   I discussed the limitations, risks, security and privacy concerns of performing an evaluation and management service by telephone and the availability of in person appointments. I also discussed with the patient that there may be a patient responsible charge related to this service. The patient expressed understanding and agreed to proceed.   History:  Angelica Hopkins is a 38 y.o. [id number] female being evaluated today for AUB. Reports she has had regular cycle since she started, then started having more frequent bleeding in January, was having some intermenstrual bleeding. Also had some heavier bleeding during her March period, usually is heavy for 3 days and then lighter, this was 7 days of heavy bleeding. She was on OCPs for heavy bleeding several years ago after a miscarriage but stopped taking them because she didn't like the way it made her feel.   Now, she is actively attempting to achieve pregnancy.  She denies any abnormal vaginal discharge, pelvic pain or other concerns.   Past Medical History:  Diagnosis Date  . Abnormal Pap smear of cervix    LSISL, colposcopy 2012  . Depression   . Hepatitis B infection   . HIV (human immunodeficiency virus infection) ([place])   . 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 Panic disorder   . Vitamin D deficiency    Past Surgical History:  Procedure Laterality Date  . APPENDECTOMY     The following portions of the patient's history were reviewed and updated as appropriate: allergies, current medications, past family history, past medical history, past social history, past surgical history and problem list.   Health Maintenance:  Pap with ASC-H on 10/27/18, needs colpo.   Review of Systems:  Pertinent items noted in HPI and remainder of  comprehensive ROS otherwise negative.  Physical Exam:   General:  Alert, oriented and cooperative. Patient appears to be in no acute distress.  Mental Status: Normal mood and affect. Normal behavior. Normal judgment and thought content.   Respiratory: Normal respiratory effort, no problems with respiration noted  Rest of physical exam deferred due to type of encounter  Labs and Imaging No results found for this or any previous visit (from the past 336 hour(s)). No results found.     Assessment and Plan:   1. Atypical squamous cells cannot exclude high grade squamous intraepithelial lesion on cytologic smear of cervix (ASC-H) Reviewed results, need for colpo She is agreeable  2. Abnormal uterine bleeding (AUB) Reviewed possible causes, recommend EMB, will start provera for heavy bleeding prn as she is actively trying to conceive    I discussed the assessment and treatment plan with the patient. The patient was provided an opportunity to ask questions and all were answered. The patient agreed with the plan and demonstrated an understanding of the instructions.   The patient was advised to call back or seek an in-person evaluation/go to the ED if the symptoms worsen or if the condition fails to improve as anticipated.  I provided 22 minutes of face-to-face via WebEx time during this encounter.   Sloan Leiter, MD Center for [place], [place]

## 2018-12-03 NOTE — Progress Notes (Signed)
NGYN  CC: Irregular periods/Heavy Periods  LMP: 11/21/18 lasted 5 days  March cycle came on twice on the 6th lasted 5 days  and 24th lasting 7 days  the 24th of March was very heavy pads  Pt noted clots that were penny/dime size   Pt had to wear 2 pads at a time.   Last pap: 10/27/18 ASCUS- +HPV 16 *Pt was made aware from PCP  Contraception: NONE

## 2019-01-05 ENCOUNTER — Encounter (HOSPITAL_COMMUNITY): Payer: Self-pay

## 2019-01-05 ENCOUNTER — Other Ambulatory Visit: Payer: Self-pay

## 2019-01-05 ENCOUNTER — Other Ambulatory Visit (HOSPITAL_COMMUNITY): Payer: Self-pay | Admitting: *Deleted

## 2019-01-05 ENCOUNTER — Ambulatory Visit (HOSPITAL_COMMUNITY)
Admission: RE | Admit: 2019-01-05 | Discharge: 2019-01-05 | Disposition: A | Discharge status: Home / Self Care | Payer: Self-pay | Source: Ambulatory Visit | Attending: Obstetrics and Gynecology | Admitting: Obstetrics and Gynecology

## 2019-01-05 VITALS — BP 102/64 | Temp 98.3°F | Wt 132.0 lb

## 2019-01-05 DIAGNOSIS — N6452 Nipple discharge: Secondary | ICD-10-CM

## 2019-01-05 DIAGNOSIS — Z1239 Encounter for other screening for malignant neoplasm of breast: Secondary | ICD-10-CM

## 2019-01-05 DIAGNOSIS — R87611 Atypical squamous cells cannot exclude high grade squamous intraepithelial lesion on cytologic smear of cervix (ASC-H): Secondary | ICD-10-CM

## 2019-01-05 NOTE — Progress Notes (Signed)
Patient referred to [facility] by [facility] due to having an abnormal Pap smear on 10/27/18 that a colposcopy is recommended for follow-up.  Pap Smear: Pap smear not completed today. Last Pap smear was 10/27/2018 at [facility] and [facility] with positive HPV. Referred patient to the Center for [place] for a colposcopy to follow-up for her abnormal Pap smear. Appointment scheduled for Wednesday, January 13, 2019 at 1355. Per patient has a history of one other abnormal Pap smear 5 years ago that a colposcopy was completed for follow-up. Last Pap smear result is in Epic.  Physical exam: Breasts Breasts symmetrical. No skin abnormalities bilateral breasts. No nipple retraction bilateral breasts. No nipple discharge left breast. Observed a small amount of white milky colored discharge right nipple. Unable to express enough discharge to send a sample to Cytology for evaluation. No lymphadenopathy. No lumps palpated bilateral breasts. No complaints of pain or tenderness on exam. Referred patient to the [place] for a diagnostic mammogram and possible right breast ultrasound. Appointment scheduled for Friday, January 15, 2019 at 1240.        Pelvic/Bimanual No Pap smear completed today since last Pap smear and HPV typing was 10/27/2018. Pap smear not indicated per BCCCP guidelines.   Smoking History: Patient is a former smoker that quit March 2019.  Patient Navigation: Patient education provided. Access to services provided for patient through BCCCP program.   Breast and Cervical Cancer Risk Assessment: Patient has a family history of a paternal aunt having breast cancer. Patient has no known genetic mutations or history of radiation treatment to the chest before age 50. Per patient has a history of cervical dysplasia. Patient has a history of HIV. Patient has no history of DES exposure in-utero.  Risk Assessment    Risk Scores      01/05/2019   Last edited by: Armond Hang, LPN   5-year risk:  0.3 %   Lifetime risk: 9.8 %

## 2019-01-05 NOTE — Patient Instructions (Signed)
Explained breast self awareness with Angelica Hopkins. Patient did not need a Pap smear today due to last Pap smear was 10/27/2018. Explained the colposcopy the recommended follow-up for her abnormal Pap smear. Referred patient to the [facility] for [place] for a colposcopy to follow-up for her abnormal Pap smear. Appointment scheduled for Wednesday, January 13, 2019 at 1355. Referred patient to the [place] for a diagnostic mammogram and possible right breast ultrasound. Appointment scheduled for Friday, January 15, 2019 at 1240. Patient aware of appointments and will be there. Angelica Hopkins verbalized understanding.  [place], Arvil Chaco, RN 4:00 PM

## 2019-01-06 ENCOUNTER — Encounter (HOSPITAL_COMMUNITY): Payer: Self-pay | Admitting: *Deleted

## 2019-01-12 ENCOUNTER — Telehealth: Payer: Self-pay | Admitting: Obstetrics & Gynecology

## 2019-01-12 NOTE — Telephone Encounter (Signed)
Attempted to call patient about her appointment. I was not able to leave a message due to her mailbox being full.

## 2019-01-13 ENCOUNTER — Ambulatory Visit (INDEPENDENT_AMBULATORY_CARE_PROVIDER_SITE_OTHER): Payer: Medicaid Other | Admitting: Obstetrics & Gynecology

## 2019-01-13 ENCOUNTER — Other Ambulatory Visit (HOSPITAL_COMMUNITY)
Admission: RE | Admit: 2019-01-13 | Discharge: 2019-01-13 | Disposition: A | Discharge status: Home / Self Care | Payer: Medicaid Other | Source: Ambulatory Visit | Attending: Obstetrics & Gynecology | Admitting: Obstetrics & Gynecology

## 2019-01-13 ENCOUNTER — Encounter: Payer: Self-pay | Admitting: Obstetrics & Gynecology

## 2019-01-13 ENCOUNTER — Other Ambulatory Visit: Payer: Self-pay

## 2019-01-13 VITALS — BP 124/75 | HR 60 | Temp 98.1°F | Wt 134.8 lb

## 2019-01-13 DIAGNOSIS — R87611 Atypical squamous cells cannot exclude high grade squamous intraepithelial lesion on cytologic smear of cervix (ASC-H): Secondary | ICD-10-CM | POA: Insufficient documentation

## 2019-01-13 LAB — POCT PREGNANCY, URINE: Preg Test, Ur: NEGATIVE

## 2019-01-13 NOTE — Patient Instructions (Signed)
COLPOSCOPY POST-PROCEDURE INSTRUCTIONS  1. You may take Ibuprofen, Aleve or Tylenol for cramping if needed.  2. If Monsel's solution was used, you will have a black discharge.  3. Light bleeding is normal.  If bleeding is heavier than your period, please call.  4. Put nothing in your vagina tonight  5. We will call you within one week with biopsy results or discuss the results at your follow-up appointment if needed.

## 2019-01-13 NOTE — Progress Notes (Signed)
    GYNECOLOGY CLINIC COLPOSCOPY PROCEDURE NOTE  38 y.o. [id number] here for colposcopy for ASC cannot exclude high grade lesion [facility]) pap smear on 10/27/2018. Discussed role for HPV in cervical dysplasia, need for surveillance.  Patient given informed consent, signed copy in the chart, time out was performed.  Placed in lithotomy position. Cervix viewed with speculum and colposcope after application of acetic acid.   Colposcopy adequate? Yes No visible lesions.  ECC specimen obtained, labeled and sent to pathology.  Patient was given post procedure instructions.  Will follow up pathology and manage accordingly; patient will be contacted with results and recommendations.  Routine preventative health maintenance measures emphasized.    Verita Schneiders, MD, [place] Attending [place], [facility] for [employer], [place]

## 2019-01-15 ENCOUNTER — Ambulatory Visit: Payer: Self-pay

## 2019-01-15 ENCOUNTER — Encounter: Payer: Self-pay | Admitting: Obstetrics & Gynecology

## 2019-01-15 ENCOUNTER — Other Ambulatory Visit: Payer: Self-pay

## 2019-01-15 ENCOUNTER — Ambulatory Visit
Admission: RE | Admit: 2019-01-15 | Discharge: 2019-01-15 | Disposition: A | Discharge status: Home / Self Care | Payer: No Typology Code available for payment source | Source: Ambulatory Visit | Attending: Obstetrics and Gynecology | Admitting: Obstetrics and Gynecology

## 2019-01-15 DIAGNOSIS — R87612 Low grade squamous intraepithelial lesion on cytologic smear of cervix (LGSIL): Secondary | ICD-10-CM | POA: Insufficient documentation

## 2019-01-15 DIAGNOSIS — N6452 Nipple discharge: Secondary | ICD-10-CM

## 2019-01-15 DIAGNOSIS — R87611 Atypical squamous cells cannot exclude high grade squamous intraepithelial lesion on cytologic smear of cervix (ASC-H): Secondary | ICD-10-CM | POA: Insufficient documentation

## 2019-01-20 ENCOUNTER — Telehealth: Payer: Self-pay | Admitting: Infectious Diseases

## 2019-01-20 NOTE — Telephone Encounter (Signed)
COVID-19 Pre-Screening Questions: ° °Do you currently have a fever (>100 °F), chills or unexplained body aches? No  ° °Are you currently experiencing new cough, shortness of breath, sore throat, runny nose? No  °•  °Have you recently travelled outside the state of [place] in the last 14 days? No  °•  °1. Have you been in contact with someone that is currently pending confirmation of Covid19 testing or has been confirmed to have the Covid19 virus?  No  ° °

## 2019-01-21 ENCOUNTER — Ambulatory Visit
Admission: RE | Admit: 2019-01-21 | Discharge: 2019-01-21 | Disposition: A | Discharge status: Home / Self Care | Payer: No Typology Code available for payment source | Source: Ambulatory Visit | Attending: Obstetrics and Gynecology | Admitting: Obstetrics and Gynecology

## 2019-01-21 ENCOUNTER — Other Ambulatory Visit (HOSPITAL_COMMUNITY): Payer: Self-pay | Admitting: Obstetrics and Gynecology

## 2019-01-21 ENCOUNTER — Encounter: Payer: Self-pay | Admitting: Infectious Diseases

## 2019-01-21 ENCOUNTER — Ambulatory Visit (INDEPENDENT_AMBULATORY_CARE_PROVIDER_SITE_OTHER): Payer: Self-pay | Admitting: Infectious Diseases

## 2019-01-21 ENCOUNTER — Other Ambulatory Visit: Payer: Self-pay

## 2019-01-21 VITALS — BP 104/71 | HR 62 | Temp 98.8°F | Wt 135.0 lb

## 2019-01-21 DIAGNOSIS — R921 Mammographic calcification found on diagnostic imaging of breast: Secondary | ICD-10-CM

## 2019-01-21 DIAGNOSIS — G43909 Migraine, unspecified, not intractable, without status migrainosus: Secondary | ICD-10-CM

## 2019-01-21 DIAGNOSIS — N6452 Nipple discharge: Secondary | ICD-10-CM

## 2019-01-21 DIAGNOSIS — B20 Human immunodeficiency virus [HIV] disease: Secondary | ICD-10-CM

## 2019-01-21 DIAGNOSIS — B181 Chronic viral hepatitis B without delta-agent: Secondary | ICD-10-CM

## 2019-01-21 DIAGNOSIS — Z113 Encounter for screening for infections with a predominantly sexual mode of transmission: Secondary | ICD-10-CM

## 2019-01-21 DIAGNOSIS — N6320 Unspecified lump in the left breast, unspecified quadrant: Secondary | ICD-10-CM

## 2019-01-21 DIAGNOSIS — N920 Excessive and frequent menstruation with regular cycle: Secondary | ICD-10-CM

## 2019-01-21 DIAGNOSIS — N939 Abnormal uterine and vaginal bleeding, unspecified: Secondary | ICD-10-CM

## 2019-01-21 NOTE — Assessment & Plan Note (Signed)
She has access to [facility] and can now have the vaginal ultrasound we discussed at her previous visit in February.  Will place orders and arrange schedule today.

## 2019-01-21 NOTE — Assessment & Plan Note (Signed)
Still ongoing since stopping amitriptyline.  She is very interested in nonmedicinal suggestions to help with migraines.  Provided her with information about lifestyle and dietary modifiers, identifying triggers.  Suggested to start coenzyme Q 10 2-3 times a day as well as magnesium 200 to 600 mg daily. She asked me about botulism injections to the scalp.  I believe there is a migraine clinic in the area and will look into this for her and place referral.  In the meantime if the above measures do not offer her enough relief we can start Topamax once nightly for suppression of migraines.

## 2019-01-21 NOTE — Assessment & Plan Note (Signed)
She is done very well on her Biktarvy with undetectable viral loads.  Due to her desire to start actively trying to get pregnant we will switch her regimen off of bictegravir.  We will start [place] once daily with a full meal as she is interested in a single tablet.  I explained to her that once she achieves pregnancy we will need to break up into a 2 tablet regimen that is the safest and most well studied.  She understands.  She can return for follow-up in 6 months or sooner if she achieves pregnancy. She is due for a Pap smear to repeat colposcopy results in September 2020.

## 2019-01-21 NOTE — Progress Notes (Signed)
Name: Angelica Hopkins  DOB: 12-16-1980  MRN: [id number] PCP: Patient, No Pcp Per    Patient Active Problem List   Diagnosis Date Noted  . Atypical squamous cells cannot exclude high grade squamous intraepithelial lesion on cytologic smear of cervix (ASC-H) 01/15/2019  . Migraines 07/18/2017  . Hepatitis B infection 11/15/2015  . Anxiety associated with depression 12/14/2014  . Neuropathy 05/28/2011  . Excessive and frequent menstruation 10/27/2009  . Human immunodeficiency virus (HIV) disease ([place]) 11/11/2008    SUBJECTIVE:  Brief Narrative:  Angelica Hopkins is a 38 y.o. AA female with HIV Dx 2005. Deferred treatment with ART x 5 years. Re-engaged in care 09/2008 and was previously on Genvoya. HIV Risk: heterosexual, parents HIV+ (was not perinatally infected) History of OIs: none. She is a [employer]. Separated from her husband and has a teenage daughter (HIV-).   Previous Regimens:   Genvoya 2010 >> suppressed, inconsistent use   Biktarvy 2018 >> suppressed   Odefsey >> switched in preparation for pregnancy  Genotype:   04/2017 - sensitive   CC:  HIV follow up care.  Migraines, persistent uterine bleeding, pregnancy counseling.   HPI:  She is doing well.  She is just started a new job at [employer] which she appreciates that she has become quite bored during quarantine.  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  Her anxiety is under better control after several sessions with Rollene Fare.  She knows she is out there again if she needs to discuss further with her.  She has not missed any doses of her Biktarvy and reports no concern with side effects or access to her medications.  She was just approved for Medicaid and would like to pursue the vaginal ultrasound that we were discussing at our prior visit as her menstrual cycles continue to be heavy and prolonged at times.  She is still actively trying to get pregnant.  She is very  interested in a nonmedication option to try and help her migraines.  She tells me they happen 2-3 times a week.  They start as a dull ache on the right side of the temple and span across her forehead.  Sometimes there are just a dull linger but they can escalate quickly.  She does not routinely use over-the-counter medications as they have made them worse for her in the past.  She usually tries to eat some food and then go to sleep to combat the pain and photosensitivity and nausea.  She was previously maintained on amitriptyline which worked well to control her headaches but affected her  Interval history noted for negative malignancy on colposcopy and cervical biopsy.  Plan to repeat Pap smear in 6 months with HPV reflex per GYN team.   Review of Systems  All other systems reviewed and are negative.  Past Medical History:  Diagnosis Date  . Abnormal Pap smear of cervix    LSISL, colposcopy 2012  . Depression   . Hepatitis B infection   . HIV (human immunodeficiency virus infection) ([place])   . 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 Panic disorder   . Vitamin D deficiency     Social History   Tobacco Use  . Smoking status: Former Smoker    Packs/day: 0.30    Types: Cigarettes    Quit date: 10/14/2017    Years since quitting: 1.2  . Smokeless tobacco: Never Used  Substance Use Topics  .  Alcohol use: No  . Drug use: Not Currently   Outpatient Medications Prior to Visit  Medication Sig Dispense Refill  . albuterol (PROVENTIL HFA;VENTOLIN HFA) 108 (90 Base) MCG/ACT inhaler Inhale 1-2 puffs into the lungs every 6 (six) hours as needed for wheezing or shortness of breath. 1 Inhaler 3  . BIKTARVY 50-200-25 MG TABS tablet TAKE 1 TABLET BY MOUTH DAILY WITH OR WITHOUT FOOD THE SAME TIME EACH DAY 30 tablet 3  . hydrOXYzine (ATARAX/VISTARIL) 25 MG tablet Take 1 tablet (25 mg total) by mouth 3 (three) times daily as needed. 30 tablet 3  . Multiple Vitamins-Minerals (MULTIVITAMIN ADULT PO) Take 1  tablet by mouth daily.    Marland Kitchen amitriptyline (ELAVIL) 10 MG tablet Take 1 tablet (10 mg total) by mouth at bedtime. 30 tablet 3  . benzonatate (TESSALON) 100 MG capsule     . Maca Root (MACA PO) Take by mouth.    . medroxyPROGESTERone (PROVERA) 5 MG tablet Take 2 tablets (10 mg total) by mouth daily. (Patient not taking: Reported on 01/05/2019) 30 tablet 3  . megestrol (MEGACE) 20 MG tablet Take 1 tablet (20 mg total) by mouth 3 (three) times daily as needed (Start at onset of heavy bleeding and continue for 7 days.). 30 tablet 1  . Multiple Vitamins-Minerals (ZINC PO) Take by mouth.    Marland Kitchen POTASSIUM PO Take 1 tablet by mouth daily.    . traZODone (DESYREL) 100 MG tablet Take 100 mg by mouth at bedtime as needed. for sleep     No facility-administered medications prior to visit.    Allergies  Allergen Reactions  . Hydrocodone-Acetaminophen Itching    Patient reported   Family History  Problem Relation Age of Onset  . Uterine cancer Maternal Aunt   . Lung cancer Maternal Grandmother   . Liver disease Mother   . HIV Mother   . Drug abuse Mother   . HIV Father   . Diabetes Father   . Drug abuse Father   . Hepatitis B Brother   . Breast cancer Paternal Aunt    Social History   Socioeconomic History  . Marital status: Legally Separated    Spouse name: Not on file  . Number of children: 1  . Years of education: Not on file  . Highest education level: Bachelor's degree (e.g., BA, AB, BS)  Occupational History  . Occupation: Pharmacist, hospital  Social Needs  . Financial resource strain: Not very hard  . Food insecurity    Worry: Never true    Inability: Never true  . Transportation needs    Medical: No    Non-medical: No  Tobacco Use  . Smoking status: Former Smoker    Packs/day: 0.30    Types: Cigarettes    Quit date: 10/14/2017    Years since quitting: 1.2  . Smokeless tobacco: Never Used  Substance and Sexual Activity  . Alcohol use: No  . Drug use: Not Currently  . Sexual activity:  Yes    Partners: Male    Birth control/protection: None  Lifestyle  . Physical activity    Days per week: Not on file    Minutes per session: Not on file  . Stress: Not on file  Relationships  . Social Herbalist on phone: Not on file    Gets together: Not on file    Attends religious service: Not on file    Active member of club or organization: Not on file    Attends  meetings of clubs or organizations: Not on file    Relationship status: Not on file  Other Topics Concern  . Not on file  Social History Narrative  . Not on file     OBJECTIVE: Vitals:   01/21/19 0845  BP: 104/71  Pulse: 62  Temp: 98.8 F (37.1 C)  TempSrc: Oral  Weight: 135 lb (61.2 kg)   Body mass index is 23.91 kg/m.  Physical Exam  Constitutional: She is oriented to person, place, and time and well-developed, well-nourished, and in no distress.  HENT:  Mouth/Throat: Oropharynx is clear and moist. No oral lesions. No dental abscesses.  Eyes: Pupils are equal, round, and reactive to light.  Cardiovascular: Normal rate, regular rhythm and normal heart sounds.  No murmur heard. Pulmonary/Chest: Effort normal and breath sounds normal. No respiratory distress.  Abdominal: Soft. She exhibits no distension. There is no abdominal tenderness.  Musculoskeletal: Normal range of motion.        General: No tenderness.  Lymphadenopathy:    She has no cervical adenopathy.  Neurological: She is alert and oriented to person, place, and time.  Skin: Skin is warm and dry. No rash noted.  Psychiatric: Mood, affect and judgment normal.  Seems more reserved/withdrawn than normal.   Vitals reviewed.  HIV 1 RNA Quant (copies/mL)  Date Value  08/13/2018 <20 NOT DETECTED  04/02/2018 <20 DETECTED (A)  09/16/2017 <20 NOT DETECTED   CD4 T Cell Abs (/uL)  Date Value  08/13/2018 830  04/02/2018 730  06/23/2017 700   Lab Results  Component Value Date   HAV NON-REACTIVE 05/12/2017   HBEAG REACTIVE (A)  08/13/2018   HEPCAB NON-REACTIVE 05/12/2017   Lab Results  Component Value Date   WBC 4.5 08/13/2018   HGB 10.7 (L) 08/13/2018   HCT 32.0 (L) 08/13/2018   MCV 87.0 08/13/2018   PLT 228 08/13/2018   Lab Results  Component Value Date   CREATININE 1.02 08/13/2018   CREATININE 1.06 (H) 08/04/2018   CREATININE 1.07 04/02/2018   Lab Results  Component Value Date   ALT 67 (H) 08/13/2018   AST 56 (H) 08/13/2018   BILITOT 0.4 08/13/2018    Problem List Items Addressed This Visit      Unprioritized   Excessive and frequent menstruation    She has access to Medicaid and can now have the vaginal ultrasound we discussed at her previous visit in February.  Will place orders and arrange schedule today.      Hepatitis B infection    She was previously undetectable with regards to hepatitis B DNA in 2018.  Given that she is continued on her [place] with excellent adherence this is likely still the case.  We will repeat hepatitis B DNA, surface antigen, E antigen and E antibody today along with liver function panel.  Discussed need for her boyfriend to have testing to ensure immunity to hepatitis B with vaccinations accordingly if indicated.      Relevant Orders   Hepatitis B surface antigen   Hepatitis B DNA, ultraquantitative, PCR   Hepatitis B e antigen   Hepatitis B e antibody   Hepatic function panel   Human immunodeficiency virus (HIV) disease ([place])    She is done very well on her Biktarvy with undetectable viral loads.  Due to her desire to start actively trying to get pregnant we will switch her regimen off of bictegravir.  We will start [place] once daily with a full meal as she is interested in  a single tablet.  I explained to her that once she achieves pregnancy we will need to break up into a 2 tablet regimen that is the safest and most well studied.  She understands.  She can return for follow-up in 6 months or sooner if she achieves pregnancy. She is due for a Pap smear to  repeat colposcopy results in September 2020.      Relevant Orders   HIV-1 RNA quant-no reflex-bld   T-helper cell (CD4)- ([facility] clinic only)   Migraines    Still ongoing since stopping amitriptyline.  She is very interested in nonmedicinal suggestions to help with migraines.  Provided her with information about lifestyle and dietary modifiers, identifying triggers.  Suggested to start coenzyme Q 10 2-3 times a day as well as magnesium 200 to 600 mg daily. She asked me about botulism injections to the scalp.  I believe there is a migraine clinic in the area and will look into this for her and place referral.  In the meantime if the above measures do not offer her enough relief we can start Topamax once nightly for suppression of migraines.       Other Visit Diagnoses    Abnormal uterine bleeding    -  Primary   Relevant Orders   US PELVIC COMPLETE WITH TRANSVAGINAL   Routine screening for STI (sexually transmitted infection)       Relevant Orders   RPR     Janene Madeira, MSN, NP-C [facility] [email or web address] Pager: [phone] Office: [phone] [place]: [phone]

## 2019-01-21 NOTE — Patient Instructions (Signed)
Always nice to see you.  Glad to see that you are doing well.  For your migraines you may want consider taking Magnesium citrate 400mg  to 600mg  daily, riboflavin 400mg , Coenzyme Q 10 100mg  three times daily. You can start with 200 to 400 mg once a day.  I take mine in the evening as it helps me sleep.  Low Carb Diet may be helpful for you as well to reduce migraines - DietDoctor.com is a great site that introduces some of the concepts and benefits.   Migraine Food Triggers: processed sweets, processed foods with nitrites (such as deli meat, hot dogs, sausages), foods with MSG, alcohol (such as wine), chocolate, certain cheeses, certain fruits (dried fruits, bananas, some citrus fruit), vinegar, diet soda.  Avoid caffeine, Routine exercise (yoga or stretching is great as well as walking), proper sleep hygiene, stay adequately hydrated with water, keep a headache diary, maintain proper stress management and consider adding meditation to help.    I will look into the migraine clinic to see how we can get she referred there.  I do however have a plan for a next step for a preventative medication that is not amitriptyline so if you need this I can send that in.  We will set up a vaginal ultrasound for you.  Would like for you to start taking if you are not already folic acid 287 mcg/day.  This is the dose that is in the prenatal vitamins.  This is a good thing to take prior to achieving pregnancy and helps with spinal cord formation of the baby.  I want you to stop your Biktarvy and switch to a pill called Odefsey.  This will be a great pill single tablet once a day.  The big thing with this 1 is to take it with a full chewable meal that consists of at least 400 cal.  This helps the medication absorb.  I think this will be a seamless transition for you and you should notice any side effects.  Also no acid reducing medications (Pepcid, Prilosec, Rolaids, Tums) as it affects the absorption.  Please  return in 6 months - here sooner if you need [place].

## 2019-01-21 NOTE — Assessment & Plan Note (Signed)
She was previously undetectable with regards to hepatitis B DNA in 2018.  Given that she is continued on her [place] with excellent adherence this is likely still the case.  We will repeat hepatitis B DNA, surface antigen, E antigen and E antibody today along with liver function panel.  Discussed need for her boyfriend to have testing to ensure immunity to hepatitis B with vaccinations accordingly if indicated.

## 2019-01-22 LAB — T-HELPER CELL (CD4) - (RCID CLINIC ONLY)
CD4 % Helper T Cell: 41 % (ref 33–65)
CD4 T Cell Abs: 701 /uL (ref 400–1790)

## 2019-01-25 ENCOUNTER — Other Ambulatory Visit: Payer: Self-pay | Admitting: Infectious Diseases

## 2019-01-25 DIAGNOSIS — B20 Human immunodeficiency virus [HIV] disease: Secondary | ICD-10-CM

## 2019-01-26 ENCOUNTER — Telehealth: Payer: Self-pay | Admitting: Advanced Practice Midwife

## 2019-01-26 ENCOUNTER — Ambulatory Visit: Payer: BLUE CROSS/BLUE SHIELD | Admitting: Infectious Diseases

## 2019-01-26 NOTE — Telephone Encounter (Signed)
Called the patient to pre-screen. Left a detailed voicemail informing if the patient is experiencing any flu-like symptoms such as fever, chills, cough, or shortness of breath and/or has been in contact with anyone who is suspected of/or confirmed to have COVID19 please call back to reschedule the appointment. Upon entering the office please wear a face mask, sanitize hands, and no visitors or children due to COVID19 restrictions. °

## 2019-01-27 ENCOUNTER — Ambulatory Visit (INDEPENDENT_AMBULATORY_CARE_PROVIDER_SITE_OTHER): Payer: Self-pay | Admitting: Obstetrics and Gynecology

## 2019-01-27 ENCOUNTER — Encounter: Payer: Self-pay | Admitting: Obstetrics and Gynecology

## 2019-01-27 ENCOUNTER — Other Ambulatory Visit: Payer: Self-pay

## 2019-01-27 ENCOUNTER — Other Ambulatory Visit (HOSPITAL_COMMUNITY)
Admission: RE | Admit: 2019-01-27 | Discharge: 2019-01-27 | Disposition: A | Discharge status: Home / Self Care | Payer: No Typology Code available for payment source | Source: Ambulatory Visit | Attending: Obstetrics and Gynecology | Admitting: Obstetrics and Gynecology

## 2019-01-27 ENCOUNTER — Ambulatory Visit (HOSPITAL_COMMUNITY): Payer: Self-pay

## 2019-01-27 ENCOUNTER — Other Ambulatory Visit (HOSPITAL_COMMUNITY)
Admission: RE | Admit: 2019-01-27 | Discharge: 2019-01-27 | Disposition: A | Discharge status: Home / Self Care | Payer: Medicaid Other | Source: Ambulatory Visit | Attending: Obstetrics and Gynecology | Admitting: Obstetrics and Gynecology

## 2019-01-27 VITALS — BP 109/73 | HR 90 | Temp 98.0°F | Wt 139.9 lb

## 2019-01-27 DIAGNOSIS — N939 Abnormal uterine and vaginal bleeding, unspecified: Secondary | ICD-10-CM | POA: Insufficient documentation

## 2019-01-27 DIAGNOSIS — R87611 Atypical squamous cells cannot exclude high grade squamous intraepithelial lesion on cytologic smear of cervix (ASC-H): Secondary | ICD-10-CM

## 2019-01-27 DIAGNOSIS — Z113 Encounter for screening for infections with a predominantly sexual mode of transmission: Secondary | ICD-10-CM

## 2019-01-27 DIAGNOSIS — N840 Polyp of corpus uteri: Secondary | ICD-10-CM

## 2019-01-27 DIAGNOSIS — N979 Female infertility, unspecified: Secondary | ICD-10-CM

## 2019-01-27 LAB — HEPATITIS B SURFACE ANTIGEN: Hepatitis B Surface Ag: REACTIVE — AB

## 2019-01-27 LAB — POCT PREGNANCY, URINE: Preg Test, Ur: NEGATIVE

## 2019-01-27 LAB — HEPATITIS B E ANTIBODY: Hep B E Ab: NONREACTIVE

## 2019-01-27 LAB — HEPATIC FUNCTION PANEL
AG Ratio: 1.2 (calc) (ref 1.0–2.5)
ALT: 43 U/L — ABNORMAL HIGH (ref 6–29)
AST: 40 U/L — ABNORMAL HIGH (ref 10–30)
Albumin: 4.1 g/dL (ref 3.6–5.1)
Alkaline phosphatase (APISO): 23 U/L — ABNORMAL LOW (ref 31–125)
Bilirubin, Direct: 0.1 mg/dL (ref 0.0–0.2)
Globulin: 3.3 g/dL (calc) (ref 1.9–3.7)
Indirect Bilirubin: 0.2 mg/dL (calc) (ref 0.2–1.2)
Total Bilirubin: 0.3 mg/dL (ref 0.2–1.2)
Total Protein: 7.4 g/dL (ref 6.1–8.1)

## 2019-01-27 LAB — HEPATITIS B DNA, ULTRAQUANTITATIVE, PCR
Hepatitis B DNA (Calc): 1.58 Log IU/mL — ABNORMAL HIGH
Hepatitis B DNA: 38 IU/mL — ABNORMAL HIGH

## 2019-01-27 LAB — HIV-1 RNA QUANT-NO REFLEX-BLD
HIV 1 RNA Quant: <20 copies/mL
HIV-1 RNA Quant, Log: <1.3 Log copies/mL

## 2019-01-27 LAB — RPR: RPR Ser Ql: NONREACTIVE

## 2019-01-27 LAB — HEPATITIS B E ANTIGEN: Hep B E Ag: REACTIVE — AB

## 2019-01-27 NOTE — Progress Notes (Signed)
GYNECOLOGY OFFICE FOLLOW UP NOTE  History:  38 y.o. G2P0011 here today for follow up for AUB. Reports she has not started provera since her last two cycles have been regular. She is still attempting to achieve pregnancy, had colpo last month.    Past Medical History:  Diagnosis Date  . Abnormal Pap smear of cervix    LSISL, colposcopy 2012  . Depression   . Hepatitis B infection   . HIV (human immunodeficiency virus infection) ([place])   . 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 Panic disorder   . Vitamin D deficiency     Past Surgical History:  Procedure Laterality Date  . APPENDECTOMY    . CESAREAN SECTION     one previous     Current Outpatient Medications:  .  albuterol (PROVENTIL HFA;VENTOLIN HFA) 108 (90 Base) MCG/ACT inhaler, Inhale 1-2 puffs into the lungs every 6 (six) hours as needed for wheezing or shortness of breath., Disp: 1 Inhaler, Rfl: 3 .  BIKTARVY 50-200-25 MG TABS tablet, TAKE 1 TABLET BY MOUTH DAILY WITH OR WITHOUT FOOD THE SAME TIME EVERY DAY, Disp: 30 tablet, Rfl: 5 .  hydrOXYzine (ATARAX/VISTARIL) 25 MG tablet, Take 1 tablet (25 mg total) by mouth 3 (three) times daily as needed., Disp: 30 tablet, Rfl: 3 .  medroxyPROGESTERone (PROVERA) 5 MG tablet, Take 2 tablets (10 mg total) by mouth daily. (Patient not taking: Reported on 01/05/2019), Disp: 30 tablet, Rfl: 3 .  Multiple Vitamins-Minerals (MULTIVITAMIN ADULT PO), Take 1 tablet by mouth daily., Disp: , Rfl:   The following portions of the patient's history were reviewed and updated as appropriate: allergies, current medications, past family history, past medical history, past social history, past surgical history and problem list.   Review of Systems:  Pertinent items noted in HPI and remainder of comprehensive ROS otherwise negative.   Objective:  Physical Exam BP 109/73   Pulse 90   Temp 98 F (36.7 C)   Wt 139 lb 14.4 oz (63.5 kg)   LMP 01/16/2019 (Approximate)   BMI 24.78 kg/m  CONSTITUTIONAL:  Well-developed, well-nourished female in no acute distress.  HENT:  Normocephalic, atraumatic. External right and left ear normal. Oropharynx is clear and moist EYES: Conjunctivae and EOM are normal. Pupils are equal, round, and reactive to light. No scleral icterus.  NECK: Normal range of motion, supple, no masses SKIN: Skin is warm and dry. No rash noted. Not diaphoretic. No erythema. No pallor. NEUROLOGIC: Alert and oriented to person, place, and time. Normal reflexes, muscle tone coordination. No cranial nerve deficit noted. PSYCHIATRIC: Normal mood and affect. Normal behavior. Normal judgment and thought content. CARDIOVASCULAR: Normal heart rate noted RESPIRATORY: Effort normal, no problems with respiration noted ABDOMEN: Soft, no distention noted.   PELVIC: Normal appearing external genitalia; normal appearing vaginal mucosa and with cleft in left anterior cervix.  No abnormal discharge noted. pelvic cultures obtained. Normal uterine size, no other palpable masses, no uterine or adnexal tenderness. MUSCULOSKELETAL: Normal range of motion. No edema noted.  Exam done with chaperone present.  Labs and Imaging   Assessment & Plan:   1. Abnormal uterine bleeding (AUB) EMB done today, please see separate procedure note - Pregnancy, urine POC - Ambulatory referral to Endocrinology - Cervicovaginal ancillary only( [place]) - Surgical pathology( [place])  2. Atypical squamous cells cannot exclude high grade squamous intraepithelial lesion on cytologic smear of cervix (ASC-H) Negative colpo, negative ECC - needs repeat pap 03/2019  3. Female  fertility problem Has been trying to achieve pregnancy on/off for 12 years - recommended she see REI, she is agreeable - Ambulatory referral to Endocrinology  Routine preventative health maintenance measures emphasized. Please refer to After Visit Summary for other counseling recommendations.   Return in about 4 weeks (around  02/24/2019) for virtual, Followup.  Total face-to-face time with patient: 20 minutes. Over 50% of encounter was spent on counseling and coordination of care.  Feliz Beam, M.D. Attending Center for [employer] Fish farm manager)

## 2019-01-27 NOTE — Progress Notes (Signed)
ENDOMETRIAL BIOPSY      Angelica Hopkins is a 38 y.o. G2P0011 here for endometrial biopsy.  The indications for endometrial biopsy were reviewed.  Risks of the biopsy including cramping, bleeding, infection, uterine perforation, inadequate specimen and need for additional procedures were discussed. Patient with LMP 01/16/19 and actively trying to achieve pregnancy, she is aware of the risk that if she has very early pregnancy, this may disrupt it and cause miscarriage. She is agreeable to continue. The patient states she understands and agrees to undergo procedure today. Consent was signed. Time out was performed.   Indications: AUB Urine HCG: negative  A bivalve speculum was placed into the vagina and the cervix was easily visualized and was prepped with Betadine x2. A single-toothed tenaculum was placed on the anterior lip of the cervix to stabilize it. The 3 mm pipelle was introduced into the endometrial cavity without difficulty to a depth of 9 cm, and a moderate amount of tissue was obtained and sent to pathology. This was repeated for a total of 3 passes. The instruments were removed from the patient's vagina. Minimal bleeding from the cervix at the tenaculum was noted.   The patient tolerated the procedure well. Routine post-procedure instructions were given to the patient.    Will base further management on results of biopsy.  Feliz Beam, M.D. Attending Center for [employer] Fish farm manager)

## 2019-01-28 ENCOUNTER — Other Ambulatory Visit: Payer: Self-pay

## 2019-01-28 ENCOUNTER — Ambulatory Visit
Admission: RE | Admit: 2019-01-28 | Discharge: 2019-01-28 | Disposition: A | Discharge status: Home / Self Care | Payer: No Typology Code available for payment source | Source: Ambulatory Visit | Attending: Obstetrics and Gynecology | Admitting: Obstetrics and Gynecology

## 2019-01-28 DIAGNOSIS — R921 Mammographic calcification found on diagnostic imaging of breast: Secondary | ICD-10-CM

## 2019-01-28 LAB — CERVICOVAGINAL ANCILLARY ONLY
Chlamydia: NEGATIVE
Neisseria Gonorrhea: NEGATIVE

## 2019-02-02 ENCOUNTER — Encounter: Payer: Self-pay | Admitting: *Deleted

## 2019-02-03 NOTE — Progress Notes (Signed)
Persistent hepatitis b surface antigen and hepatitis b e-antigen despite combination therapy with emtricitibine/tenofovir. Fortunately her Hep B DNA remains very low @ 54 copies. LFTs remain elevated <2x ULN.  Will arrange to repeat liver ultrasound prior to next appointment to follow her CHB infection.  Risk for transmission with low viral load in the setting of good adherence is unlikely.  She is trying to actively achieve pregnancy with partner and will need to remain on tenofovir regimen ([place]) with frequent monitoring for flares considering she is still actively replicating virus.  Results communicated to patient via mychart.

## 2019-02-08 ENCOUNTER — Other Ambulatory Visit: Payer: Self-pay | Admitting: General Surgery

## 2019-02-08 DIAGNOSIS — R928 Other abnormal and inconclusive findings on diagnostic imaging of breast: Secondary | ICD-10-CM

## 2019-02-09 ENCOUNTER — Other Ambulatory Visit: Payer: Self-pay | Admitting: General Surgery

## 2019-02-09 DIAGNOSIS — R928 Other abnormal and inconclusive findings on diagnostic imaging of breast: Secondary | ICD-10-CM

## 2019-02-24 ENCOUNTER — Other Ambulatory Visit: Payer: Self-pay

## 2019-02-24 ENCOUNTER — Encounter (HOSPITAL_BASED_OUTPATIENT_CLINIC_OR_DEPARTMENT_OTHER): Payer: Self-pay | Admitting: *Deleted

## 2019-03-01 ENCOUNTER — Encounter: Payer: Self-pay | Admitting: Obstetrics and Gynecology

## 2019-03-01 ENCOUNTER — Other Ambulatory Visit (HOSPITAL_COMMUNITY)
Admission: RE | Admit: 2019-03-01 | Discharge: 2019-03-01 | Disposition: A | Discharge status: Home / Self Care | Payer: HRSA Program | Source: Ambulatory Visit | Attending: General Surgery | Admitting: General Surgery

## 2019-03-01 ENCOUNTER — Other Ambulatory Visit: Payer: Self-pay

## 2019-03-01 ENCOUNTER — Telehealth (INDEPENDENT_AMBULATORY_CARE_PROVIDER_SITE_OTHER): Payer: Self-pay | Admitting: Obstetrics and Gynecology

## 2019-03-01 DIAGNOSIS — R87611 Atypical squamous cells cannot exclude high grade squamous intraepithelial lesion on cytologic smear of cervix (ASC-H): Secondary | ICD-10-CM

## 2019-03-01 DIAGNOSIS — Z20828 Contact with and (suspected) exposure to other viral communicable diseases: Secondary | ICD-10-CM | POA: Diagnosis not present

## 2019-03-01 DIAGNOSIS — N939 Abnormal uterine and vaginal bleeding, unspecified: Secondary | ICD-10-CM

## 2019-03-01 DIAGNOSIS — Z01812 Encounter for preprocedural laboratory examination: Secondary | ICD-10-CM | POA: Diagnosis present

## 2019-03-01 DIAGNOSIS — N840 Polyp of corpus uteri: Secondary | ICD-10-CM

## 2019-03-01 DIAGNOSIS — N979 Female infertility, unspecified: Secondary | ICD-10-CM

## 2019-03-01 LAB — SARS CORONAVIRUS 2 (TAT 6-24 HRS): SARS Coronavirus 2: NEGATIVE

## 2019-03-01 NOTE — Progress Notes (Signed)
TELEHEALTH GYNECOLOGY VIRTUAL VIDEO VISIT ENCOUNTER NOTE  Provider location: Center for [employer] at [facility]   I connected with Angelica Hopkins on 03/01/19 at  2:15 PM EDT by MyChart Video Encounter at home and verified that I am speaking with the correct person using two identifiers.   I discussed the limitations, risks, security and privacy concerns of performing an evaluation and management service virtually and the availability of in person appointments. I also discussed with the patient that there may be a patient responsible charge related to this service. The patient expressed understanding and agreed to proceed.   History:  Angelica Hopkins is a 38 y.o. [id number] female being evaluated today for AUB, infertility. Appt today for results and next steps. Had period 02/10/2019, lasted 5-6 days, states this was a normal period for her, went 3-4 pads/tampons. She has now had 3 normal periods in a row.   She denies any abnormal vaginal discharge, bleeding, pelvic pain or other concerns.       Past Medical History:  Diagnosis Date  . Abnormal Pap smear of cervix    LSISL, colposcopy 2012  . Asthma   . Depression   . Hepatitis B infection   . HIV (human immunodeficiency virus infection) ([place])   . 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 Panic disorder   . Vitamin D deficiency    Past Surgical History:  Procedure Laterality Date  . APPENDECTOMY    . CESAREAN SECTION     one previous   The following portions of the patient's history were reviewed and updated as appropriate: allergies, current medications, past family history, past medical history, past social history, past surgical history and problem list.     Review of Systems:  Pertinent items noted in HPI and remainder of comprehensive ROS otherwise negative.  Physical Exam:   General:  Alert, oriented and cooperative. Patient appears to be in no acute distress.  Mental Status: Normal mood and affect. Normal  behavior. Normal judgment and thought content.   Respiratory: Normal respiratory effort, no problems with respiration noted  Rest of physical exam deferred due to type of encounter  Labs and Imaging No results found for this or any previous visit (from the past 336 hour(s)). No results found.     Assessment and Plan:   1. Abnormal uterine bleeding (AUB) Has normalized last several periods - benign EMB path  2. Female fertility problem To make appt with REI  3. Endometrial polyp Reviewed path from EMB, possibly did not remove all of polyp, and would generally recommend C& to ensure complete removal, however as patient is actively attempting to achieve pregnancy and has upcoming appointment with REI, will defer to REI to avoid delaying potential fertility treatment - patient verbalizes understanding and is in agreement with plan  4. Atypical squamous cells cannot exclude high grade squamous intraepithelial lesion on cytologic smear of cervix (ASC-H) Return 9-04/2019 for repeat pap    I discussed the assessment and treatment plan with the patient. The patient was provided an opportunity to ask questions and all were answered. The patient agreed with the plan and demonstrated an understanding of the instructions.   The patient was advised to call back or seek an in-person evaluation/go to the ED if the symptoms worsen or if the condition fails to improve as anticipated.  I provided 15 minutes of face-to-face time during this encounter.   Sloan Leiter, MD Center for [place], [facility]

## 2019-03-03 ENCOUNTER — Encounter (HOSPITAL_BASED_OUTPATIENT_CLINIC_OR_DEPARTMENT_OTHER)
Admission: RE | Admit: 2019-03-03 | Discharge: 2019-03-03 | Disposition: A | Discharge status: Home / Self Care | Payer: No Typology Code available for payment source | Source: Ambulatory Visit | Attending: General Surgery | Admitting: General Surgery

## 2019-03-03 ENCOUNTER — Other Ambulatory Visit: Payer: Self-pay

## 2019-03-03 ENCOUNTER — Ambulatory Visit
Admission: RE | Admit: 2019-03-03 | Discharge: 2019-03-03 | Disposition: A | Discharge status: Home / Self Care | Payer: No Typology Code available for payment source | Source: Ambulatory Visit | Attending: General Surgery | Admitting: General Surgery

## 2019-03-03 DIAGNOSIS — R928 Other abnormal and inconclusive findings on diagnostic imaging of breast: Secondary | ICD-10-CM

## 2019-03-03 DIAGNOSIS — Z01812 Encounter for preprocedural laboratory examination: Secondary | ICD-10-CM | POA: Insufficient documentation

## 2019-03-03 LAB — POCT PREGNANCY, URINE: Preg Test, Ur: NEGATIVE

## 2019-03-03 NOTE — Progress Notes (Signed)
PT in for PAT appt. UPT negative.           Enhanced Recovery after Surgery for Orthopedics Enhanced Recovery after Surgery is a protocol used to improve the stress on your body and your recovery after surgery.  Patient Instructions  . The night before surgery:  o No food after midnight. ONLY clear liquids after midnight  . The day of surgery (if you do NOT have diabetes):  o Drink ONE (1) Pre-Surgery Clear Ensure as directed.   o This drink was given to you during your hospital  pre-op appointment visit. o The pre-op nurse will instruct you on the time to drink the  Pre-Surgery Ensure depending on your surgery time. o Finish the drink at the designated time by the pre-op nurse.  o Nothing else to drink after completing the  Pre-Surgery Clear Ensure.  . The day of surgery (if you have diabetes): o Drink ONE (1) Gatorade 2 (G2) as directed. o This drink was given to you during your hospital  pre-op appointment visit.  o The pre-op nurse will instruct you on the time to drink the   Gatorade 2 (G2) depending on your surgery time. o Color of the Gatorade may vary. Red is not allowed. o Nothing else to drink after completing the  Gatorade 2 (G2).         If you have questions, please contact your surgeon's office.

## 2019-03-04 ENCOUNTER — Ambulatory Visit (HOSPITAL_BASED_OUTPATIENT_CLINIC_OR_DEPARTMENT_OTHER): Payer: Self-pay | Admitting: Certified Registered"

## 2019-03-04 ENCOUNTER — Encounter (HOSPITAL_BASED_OUTPATIENT_CLINIC_OR_DEPARTMENT_OTHER): Payer: Self-pay | Admitting: Certified Registered"

## 2019-03-04 ENCOUNTER — Ambulatory Visit (HOSPITAL_BASED_OUTPATIENT_CLINIC_OR_DEPARTMENT_OTHER)
Admission: RE | Admit: 2019-03-04 | Discharge: 2019-03-04 | Disposition: A | Discharge status: Home / Self Care | Payer: Self-pay | Attending: General Surgery | Admitting: General Surgery

## 2019-03-04 ENCOUNTER — Other Ambulatory Visit: Payer: Self-pay

## 2019-03-04 ENCOUNTER — Ambulatory Visit
Admission: RE | Admit: 2019-03-04 | Discharge: 2019-03-04 | Disposition: A | Discharge status: Home / Self Care | Payer: No Typology Code available for payment source | Source: Ambulatory Visit | Attending: General Surgery | Admitting: General Surgery

## 2019-03-04 ENCOUNTER — Encounter (HOSPITAL_BASED_OUTPATIENT_CLINIC_OR_DEPARTMENT_OTHER)
Admission: RE | Disposition: A | Discharge status: Home / Self Care | Payer: Self-pay | Source: Home / Self Care | Attending: General Surgery

## 2019-03-04 DIAGNOSIS — J45909 Unspecified asthma, uncomplicated: Secondary | ICD-10-CM | POA: Insufficient documentation

## 2019-03-04 DIAGNOSIS — Z79899 Other long term (current) drug therapy: Secondary | ICD-10-CM | POA: Insufficient documentation

## 2019-03-04 DIAGNOSIS — Z8619 Personal history of other infectious and parasitic diseases: Secondary | ICD-10-CM | POA: Insufficient documentation

## 2019-03-04 DIAGNOSIS — Z885 Allergy status to narcotic agent status: Secondary | ICD-10-CM | POA: Insufficient documentation

## 2019-03-04 DIAGNOSIS — N6012 Diffuse cystic mastopathy of left breast: Secondary | ICD-10-CM | POA: Insufficient documentation

## 2019-03-04 DIAGNOSIS — D242 Benign neoplasm of left breast: Secondary | ICD-10-CM | POA: Insufficient documentation

## 2019-03-04 DIAGNOSIS — Z21 Asymptomatic human immunodeficiency virus [HIV] infection status: Secondary | ICD-10-CM | POA: Insufficient documentation

## 2019-03-04 DIAGNOSIS — N6452 Nipple discharge: Secondary | ICD-10-CM | POA: Insufficient documentation

## 2019-03-04 DIAGNOSIS — F17200 Nicotine dependence, unspecified, uncomplicated: Secondary | ICD-10-CM | POA: Insufficient documentation

## 2019-03-04 DIAGNOSIS — Z803 Family history of malignant neoplasm of breast: Secondary | ICD-10-CM | POA: Insufficient documentation

## 2019-03-04 DIAGNOSIS — R928 Other abnormal and inconclusive findings on diagnostic imaging of breast: Secondary | ICD-10-CM

## 2019-03-04 HISTORY — PX: RADIOACTIVE SEED GUIDED EXCISIONAL BREAST BIOPSY: SHX6490

## 2019-03-04 HISTORY — DX: Unspecified asthma, uncomplicated: J45.909

## 2019-03-04 SURGERY — RADIOACTIVE SEED GUIDED BREAST BIOPSY
Anesthesia: General | Site: Breast | Laterality: Left

## 2019-03-04 MED ORDER — CHLORHEXIDINE GLUCONATE CLOTH 2 % EX PADS: 6.0000 | MEDICATED_PAD | Freq: Once | CUTANEOUS | Status: DC

## 2019-03-04 MED ORDER — EPHEDRINE 5 MG/ML INJ
INTRAVENOUS | Status: AC
  Filled 2019-03-04: qty 10

## 2019-03-04 MED ORDER — ONDANSETRON HCL 4 MG/2ML IJ SOLN
INTRAMUSCULAR | Status: DC | PRN
  Administered 2019-03-04: 4 mg via INTRAVENOUS

## 2019-03-04 MED ORDER — MIDAZOLAM HCL 2 MG/2ML IJ SOLN: 1.0000 mg | INTRAMUSCULAR | Status: DC | PRN

## 2019-03-04 MED ORDER — ONDANSETRON HCL 4 MG/2ML IJ SOLN
INTRAMUSCULAR | Status: AC
  Filled 2019-03-04: qty 2

## 2019-03-04 MED ORDER — LIDOCAINE 2% (20 MG/ML) 5 ML SYRINGE: INTRAMUSCULAR | Status: DC | PRN

## 2019-03-04 MED ORDER — SCOPOLAMINE 1 MG/3DAYS TD PT72: 1.0000 | MEDICATED_PATCH | Freq: Once | TRANSDERMAL | Status: DC

## 2019-03-04 MED ORDER — LACTATED RINGERS IV SOLN
INTRAVENOUS | Status: DC
  Administered 2019-03-04: 08:00:00 via INTRAVENOUS

## 2019-03-04 MED ORDER — DEXAMETHASONE SODIUM PHOSPHATE 10 MG/ML IJ SOLN
INTRAMUSCULAR | Status: AC
  Filled 2019-03-04: qty 1

## 2019-03-04 MED ORDER — MIDAZOLAM HCL 5 MG/5ML IJ SOLN
INTRAMUSCULAR | Status: DC | PRN
  Administered 2019-03-04: 2 mg via INTRAVENOUS

## 2019-03-04 MED ORDER — GABAPENTIN 300 MG PO CAPS
300.0000 mg | ORAL_CAPSULE | ORAL | Status: AC
  Administered 2019-03-04: 300 mg via ORAL

## 2019-03-04 MED ORDER — OXYCODONE HCL 5 MG PO TABS: 5.0000 mg | ORAL_TABLET | Freq: Once | ORAL | Status: DC | PRN

## 2019-03-04 MED ORDER — CEFAZOLIN SODIUM-DEXTROSE 2-4 GM/100ML-% IV SOLN
2.0000 g | INTRAVENOUS | Status: AC
  Administered 2019-03-04: 2 g via INTRAVENOUS

## 2019-03-04 MED ORDER — FENTANYL CITRATE (PF) 100 MCG/2ML IJ SOLN: 25.0000 ug | INTRAMUSCULAR | Status: DC | PRN

## 2019-03-04 MED ORDER — DEXAMETHASONE SODIUM PHOSPHATE 10 MG/ML IJ SOLN
INTRAMUSCULAR | Status: DC | PRN
  Administered 2019-03-04: 10 mg via INTRAVENOUS

## 2019-03-04 MED ORDER — OXYCODONE HCL 5 MG/5ML PO SOLN: 5.0000 mg | Freq: Once | ORAL | Status: DC | PRN

## 2019-03-04 MED ORDER — FENTANYL CITRATE (PF) 100 MCG/2ML IJ SOLN
INTRAMUSCULAR | Status: AC
  Filled 2019-03-04: qty 2

## 2019-03-04 MED ORDER — ACETAMINOPHEN 500 MG PO TABS
1000.0000 mg | ORAL_TABLET | ORAL | Status: AC
  Administered 2019-03-04: 1000 mg via ORAL

## 2019-03-04 MED ORDER — ONDANSETRON HCL 4 MG/2ML IJ SOLN: 4.0000 mg | Freq: Once | INTRAMUSCULAR | Status: DC | PRN

## 2019-03-04 MED ORDER — KETOROLAC TROMETHAMINE 30 MG/ML IJ SOLN
INTRAMUSCULAR | Status: DC | PRN
  Administered 2019-03-04: 30 mg via INTRAVENOUS

## 2019-03-04 MED ORDER — MIDAZOLAM HCL 2 MG/2ML IJ SOLN
INTRAMUSCULAR | Status: AC
  Filled 2019-03-04: qty 2

## 2019-03-04 MED ORDER — KETOROLAC TROMETHAMINE 30 MG/ML IJ SOLN
INTRAMUSCULAR | Status: AC
  Filled 2019-03-04: qty 2

## 2019-03-04 MED ORDER — LIDOCAINE 2% (20 MG/ML) 5 ML SYRINGE
INTRAMUSCULAR | Status: DC | PRN
  Administered 2019-03-04: 60 mg via INTRAVENOUS

## 2019-03-04 MED ORDER — OXYCODONE HCL 5 MG PO TABS: 5.0000 mg | ORAL_TABLET | Freq: Four times a day (QID) | ORAL | 0 refills | Status: DC | PRN

## 2019-03-04 MED ORDER — FENTANYL CITRATE (PF) 100 MCG/2ML IJ SOLN: 50.0000 ug | INTRAMUSCULAR | Status: DC | PRN

## 2019-03-04 MED ORDER — CEFAZOLIN SODIUM-DEXTROSE 2-4 GM/100ML-% IV SOLN
INTRAVENOUS | Status: AC
  Filled 2019-03-04: qty 100

## 2019-03-04 MED ORDER — PROPOFOL 10 MG/ML IV BOLUS
INTRAVENOUS | Status: DC | PRN
  Administered 2019-03-04: 130 mg via INTRAVENOUS

## 2019-03-04 MED ORDER — GABAPENTIN 300 MG PO CAPS
ORAL_CAPSULE | ORAL | Status: AC
  Filled 2019-03-04: qty 1

## 2019-03-04 MED ORDER — LIDOCAINE-EPINEPHRINE (PF) 1 %-1:200000 IJ SOLN
INTRAMUSCULAR | Status: DC | PRN
  Administered 2019-03-04: 40 mL

## 2019-03-04 MED ORDER — ACETAMINOPHEN 500 MG PO TABS
ORAL_TABLET | ORAL | Status: AC
  Filled 2019-03-04: qty 2

## 2019-03-04 MED ORDER — FENTANYL CITRATE (PF) 100 MCG/2ML IJ SOLN
INTRAMUSCULAR | Status: DC | PRN
  Administered 2019-03-04 (×4): 25 ug via INTRAVENOUS

## 2019-03-04 SURGICAL SUPPLY — 52 items
BINDER BREAST MEDIUM (GAUZE/BANDAGES/DRESSINGS) ×2 IMPLANT
BLADE SURG 10 STRL SS (BLADE) ×2 IMPLANT
BLADE SURG 15 STRL LF DISP TIS (BLADE) IMPLANT
BLADE SURG 15 STRL SS (BLADE)
CANISTER SUC SOCK COL 7IN (MISCELLANEOUS) IMPLANT
CANISTER SUCT 1200ML W/VALVE (MISCELLANEOUS) ×2 IMPLANT
CHLORAPREP W/TINT 26 (MISCELLANEOUS) ×2 IMPLANT
CLIP VESOCCLUDE LG 6/CT (CLIP) ×2 IMPLANT
COVER BACK TABLE REUSABLE LG (DRAPES) ×2 IMPLANT
COVER MAYO STAND REUSABLE (DRAPES) ×2 IMPLANT
COVER PROBE W GEL 5X96 (DRAPES) ×2 IMPLANT
COVER WAND RF STERILE (DRAPES) IMPLANT
DECANTER SPIKE VIAL GLASS SM (MISCELLANEOUS) IMPLANT
DERMABOND ADVANCED (GAUZE/BANDAGES/DRESSINGS) ×1
DERMABOND ADVANCED .7 DNX12 (GAUZE/BANDAGES/DRESSINGS) ×1 IMPLANT
DRAPE LAPAROSCOPIC ABDOMINAL (DRAPES) ×2 IMPLANT
DRAPE UTILITY XL STRL (DRAPES) ×2 IMPLANT
DRSG PAD ABDOMINAL 8X10 ST (GAUZE/BANDAGES/DRESSINGS) ×2 IMPLANT
ELECT COATED BLADE 2.86 ST (ELECTRODE) ×2 IMPLANT
ELECT REM PT RETURN 9FT ADLT (ELECTROSURGICAL) ×2
ELECTRODE REM PT RTRN 9FT ADLT (ELECTROSURGICAL) ×1 IMPLANT
GAUZE SPONGE 4X4 12PLY STRL LF (GAUZE/BANDAGES/DRESSINGS) ×2 IMPLANT
GLOVE BIO SURGEON STRL SZ 6 (GLOVE) ×2 IMPLANT
GLOVE BIOGEL PI IND STRL 6.5 (GLOVE) ×2 IMPLANT
GLOVE BIOGEL PI INDICATOR 6.5 (GLOVE) ×2
GLOVE ECLIPSE 6.5 STRL STRAW (GLOVE) ×2 IMPLANT
GLOVE EXAM NITRILE MD LF STRL (GLOVE) ×2 IMPLANT
GOWN STRL REUS W/ TWL LRG LVL3 (GOWN DISPOSABLE) ×1 IMPLANT
GOWN STRL REUS W/TWL 2XL LVL3 (GOWN DISPOSABLE) ×2 IMPLANT
GOWN STRL REUS W/TWL LRG LVL3 (GOWN DISPOSABLE) ×1
KIT MARKER MARGIN INK (KITS) ×2 IMPLANT
LIGHT WAVEGUIDE WIDE FLAT (MISCELLANEOUS) ×2 IMPLANT
NEEDLE HYPO 25X1 1.5 SAFETY (NEEDLE) ×2 IMPLANT
NS IRRIG 1000ML POUR BTL (IV SOLUTION) ×2 IMPLANT
PACK BASIN DAY SURGERY FS (CUSTOM PROCEDURE TRAY) ×2 IMPLANT
PENCIL BUTTON HOLSTER BLD 10FT (ELECTRODE) ×2 IMPLANT
SLEEVE SCD COMPRESS KNEE MED (MISCELLANEOUS) ×2 IMPLANT
SPONGE LAP 18X18 RF (DISPOSABLE) ×2 IMPLANT
STAPLER VISISTAT 35W (STAPLE) IMPLANT
STRIP CLOSURE SKIN 1/2X4 (GAUZE/BANDAGES/DRESSINGS) ×2 IMPLANT
SUT MON AB 4-0 PC3 18 (SUTURE) ×2 IMPLANT
SUT SILK 2 0 SH (SUTURE) IMPLANT
SUT VIC AB 2-0 SH 18 (SUTURE) IMPLANT
SUT VIC AB 3-0 SH 27 (SUTURE) ×1
SUT VIC AB 3-0 SH 27X BRD (SUTURE) ×1 IMPLANT
SUT VICRYL 3-0 CR8 SH (SUTURE) IMPLANT
SYR BULB 3OZ (MISCELLANEOUS) ×2 IMPLANT
SYR CONTROL 10ML LL (SYRINGE) ×2 IMPLANT
TOWEL GREEN STERILE FF (TOWEL DISPOSABLE) ×2 IMPLANT
TRAY FAXITRON CT DISP (TRAY / TRAY PROCEDURE) ×2 IMPLANT
TUBE CONNECTING 20X1/4 (TUBING) ×2 IMPLANT
YANKAUER SUCT BULB TIP NO VENT (SUCTIONS) ×2 IMPLANT

## 2019-03-04 NOTE — Anesthesia Preprocedure Evaluation (Addendum)
Anesthesia Evaluation  Patient identified by MRN, date of birth, ID band Patient awake    Reviewed: Allergy & Precautions, NPO status , Patient's Chart, lab work & pertinent test results  History of Anesthesia Complications Negative for: history of anesthetic complications  Airway Mallampati: II  TM Distance: >3 FB Neck ROM: Full    Dental   Pulmonary asthma , Current Smoker and Patient abstained from smoking.,    Pulmonary exam normal        Cardiovascular negative cardio ROS Normal cardiovascular exam     Neuro/Psych negative neurological ROS  negative psych ROS   GI/Hepatic negative GI ROS, (+) Hepatitis -, B  Endo/Other  negative endocrine ROS  Renal/GU negative Renal ROS  negative genitourinary   Musculoskeletal negative musculoskeletal ROS (+)   Abdominal   Peds  Hematology  (+) HIV,   Anesthesia Other Findings   Reproductive/Obstetrics                            Anesthesia Physical Anesthesia Plan  ASA: II  Anesthesia Plan: General   Post-op Pain Management:    Induction: Intravenous  PONV Risk Score and Plan: 2 and Ondansetron, Dexamethasone, Midazolam and Treatment may vary due to age or medical condition  Airway Management Planned: LMA  Additional Equipment: None  Intra-op Plan:   Post-operative Plan: Extubation in OR  Informed Consent: I have reviewed the patients History and Physical, chart, labs and discussed the procedure including the risks, benefits and alternatives for the proposed anesthesia with the patient or authorized representative who has indicated his/her understanding and acceptance.     Dental advisory given  Plan Discussed with:   Anesthesia Plan Comments:        Anesthesia Quick Evaluation

## 2019-03-04 NOTE — H&P (Signed)
Angelica Hopkins Documented: 02/08/2019 1:37 PM Location: [facility] Patient #: [id number] DOB: 16-May-1981 Married / Language: English / Race: Black or African American Female   History of Present Illness Angelica Klein MD; 02/08/2019 2:19 PM) The patient is a 38 year old female who presents with a complaint of Breast problems. Patient is a 38 year old female referred for consultation by Dr. Shelly Hopkins for a abnormal left mammogram. The patient was found to have a abnormal Pap smear. As part of the abnormal Pap smear she received a diagnostic mammogram which showed some calcifications in the lower inner quadrant of the left breast as well as a mass at 7:00 in the left breast. She also was reported to have some white discharge from the lower of her breast at the time. Both of these areas were biopsied. 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 She is referred for excision. Patient has not had any significant breast pain until after the biopsy. Patient has no personal history of cancer. She is an HIV patient. She does have a paternal aunt who had breast cancer. Her endometrial biopsy and colposcopy did not reveal any evidence of malignancy. And further details regarding the white discharge from the breast, it sounds more like dead skin cells and sebum from an inverted right nipple that is chronic.  dx mammogram bilat 01/21/2019 CLINICAL DATA: 38 year old female with equivocal whitish RIGHT nipple discharge for several years.  EXAM: DIGITAL DIAGNOSTIC BILATERAL MAMMOGRAM WITH CAD AND TOMO  ULTRASOUND BILATERAL BREAST  COMPARISON: None  ACR Breast Density Category d: The breast tissue is extremely dense, which lowers the sensitivity of mammography.  FINDINGS: 2D/3D full field views of both breasts, spot compression view of the RIGHT breast and magnification views of the LEFT breast are performed.  No suspicious RIGHT  breast abnormalities are identified.  A 1 cm group of heterogeneous calcifications within the LOWER LEFT breast identified.  A 2 cm circumscribed oval mass within the INNER LEFT breast is identified.  The calcifications and mass or separated by a distance of 3.5 cm in the transverse dimension  Mammographic images were processed with CAD.  On physical exam, no RIGHT nipple discharge could be elicited today.  A firm palpable mobile mass is identified at the 7 o'clock position of the LEFT breast 4 cm from the nipple.  Targeted ultrasound is performed, showing the following:  RIGHT breast:  No abnormality within the RETROAREOLAR RIGHT breast. No abnormal RIGHT axillary lymph nodes.  LEFT breast:  A 2 x 0.9 x 2 cm circumscribed oval hypoechoic parallel mass at the 7 o'clock position of the LEFT breast 4 cm from the nipple.  No definite sonographic abnormality is identified within the LEFT breast in the area of calcifications identified mammographically.  No abnormal LEFT axillary lymph nodes are identified.  IMPRESSION: 1. Indeterminate 1 cm group of heterogeneous calcifications within the LOWER LEFT breast. Tissue sampling is recommended. 2. 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 We discussed management options including excision, ultrasound-guided core biopsy, and close follow-up. The patient desires tissue sampling. 3. No mammographic or sonographic abnormality within the RIGHT breast. Equivocal RIGHT nipple discharge per patient is felt to be benign.  RECOMMENDATION: 1. Stereotactic/3D guided LEFT breast biopsy of LOWER LEFT breast calcifications. 2. Ultrasound-guided biopsy of 2 cm LOWER INNER LEFT breast mass.  I have discussed the findings and recommendations with the patient. She was instructed to contact  us if RIGHT nipple discharge return or increased. If applicable, a reminder letter will be sent to the patient regarding  the next appointment.  BI-RADS CATEGORY 4: Suspicious.  pathology 01/28/19 Diagnosis 1. Breast, left, needle core biopsy, 7 o'clock mass - FIBROADENOMA. 2. Breast, left, needle core biopsy, LIQ calcifications - FLAT EPITHELIAL ATYPIA WITH CALCIFICATIONS.   Past Surgical History Angelica Klein, MD; 02/08/2019 1:41 PM) Appendectomy  Cesarean Section - 1   Diagnostic Studies History Angelica Klein, MD; 02/08/2019 1:41 PM) Colonoscopy  never Mammogram  within last year Pap Smear  1-5 years ago  Allergies (Angelica Hopkins, CMA; 02/08/2019 1:52 PM) HYDROcodone-Acetaminophen *ANALGESICS - OPIOID*  Itching.  Medication History (Angelica Hopkins, CMA; 02/08/2019 1:55 PM) Biktarvy (50-200-25MG  Tablet, Oral) Active. Albuterol (90MCG/ACT Aerosol Soln, Inhalation) Active. Medications Reconciled  Social History Angelica Klein, MD; 02/08/2019 1:41 PM) Alcohol use  Remotely quit alcohol use. Caffeine use  Carbonated beverages, Coffee, Tea. No drug use  Tobacco use  Current every day smoker.  Family History Angelica Klein, MD; 02/08/2019 1:41 PM) Diabetes Mellitus  Father. Migraine Headache  Daughter, Mother.  Pregnancy / Birth History Angelica Klein, MD; 02/08/2019 1:41 PM) Age at menarche  34 years. Gravida  2 Length (months) of breastfeeding  3-6 Maternal age  82-20 Para  1 Regular periods   Other Problems Angelica Klein, MD; 02/08/2019 1:41 PM) HIV-positive     Review of Systems Angelica Klein MD; 02/08/2019 1:41 PM) General Not Present- Appetite Loss, Chills, Fatigue, Fever, Night Sweats, Weight Gain and Weight Loss. Skin Not Present- Change in Wart/Mole, Dryness, Hives, Jaundice, New Lesions, Non-Healing Wounds, Rash and Ulcer. HEENT Not Present- Earache, Hearing Loss, Hoarseness, Nose Bleed, Oral Ulcers, Ringing in the Ears, Seasonal Allergies, Sinus Pain, Sore Throat, Visual Disturbances, Wears glasses/contact lenses and Yellow Eyes. Respiratory Not Present- Bloody  sputum, Chronic Cough, Difficulty Breathing, Snoring and Wheezing. Breast Present- Breast Mass. Not Present- Breast Pain, Nipple Discharge and Skin Changes. Cardiovascular Not Present- Chest Pain, Difficulty Breathing Lying Down, Leg Cramps, Palpitations, Rapid Heart Rate, Shortness of Breath and Swelling of Extremities. Gastrointestinal Not Present- Abdominal Pain, Bloating, Bloody Stool, Change in Bowel Habits, Chronic diarrhea, Constipation, Difficulty Swallowing, Excessive gas, Gets full quickly at meals, Hemorrhoids, Indigestion, Nausea, Rectal Pain and Vomiting. Female Genitourinary Not Present- Frequency, Nocturia, Painful Urination, Pelvic Pain and Urgency. Musculoskeletal Not Present- Back Pain, Joint Pain, Joint Stiffness, Muscle Pain, Muscle Weakness and Swelling of Extremities. Neurological Not Present- Decreased Memory, Fainting, Headaches, Numbness, Seizures, Tingling, Tremor, Trouble walking and Weakness. Psychiatric Not Present- Anxiety, Bipolar, Change in Sleep Pattern, Depression, Fearful and Frequent crying. Endocrine Not Present- Cold Intolerance, Excessive Hunger, Hair Changes, Heat Intolerance, Hot flashes and New Diabetes. Hematology Not Present- Blood Thinners, Easy Bruising, Excessive bleeding, Gland problems, HIV and Persistent Infections.  Vitals (Angelica Hopkins CMA; 02/08/2019 1:56 PM) 02/08/2019 1:55 PM Weight: 140.13 lb Height: 63in Body Surface Area: 1.66 m Body Mass Index: 24.82 kg/m  Temp.: 98.63F (Oral)  Pulse: 67 (Regular)  BP: 118/72(Sitting, Left Arm, Standard)       Physical Exam Angelica Klein MD; 02/08/2019 2:20 PM) General Mental Status-Alert. General Appearance-Consistent with stated age. Hydration-Well hydrated. Voice-Normal. Note: wearing headscarf   Head and Neck Head-normocephalic, atraumatic with no lesions or palpable masses. Trachea-midline. Thyroid Gland Characteristics - normal size and  consistency.  Eye Eyeball - Bilateral-Extraocular movements intact. Sclera/Conjunctiva - Bilateral-No scleral icterus.  Chest and Lung Exam Chest and lung exam reveals -quiet, even and easy respiratory effort with no use of accessory muscles  and on auscultation, normal breath sounds, no adventitious sounds and normal vocal resonance. Inspection Chest Wall - Normal. Back - normal.  Breast Note: breasts are sl ptotic bilaterally. left breast larger than right. mobile rubbery mass palpable at 7 o'clock around 2 cm in size. breasts are dense bilaterally. no LAD. no skin dimpling.   Cardiovascular Cardiovascular examination reveals -normal heart sounds, regular rate and rhythm with no murmurs and normal pedal pulses bilaterally.  Abdomen Inspection Inspection of the abdomen reveals - No Hernias. Palpation/Percussion Palpation and Percussion of the abdomen reveal - Soft, Non Tender, No Rebound tenderness, No Rigidity (guarding) and No hepatosplenomegaly. Auscultation Auscultation of the abdomen reveals - Bowel sounds normal.  Neurologic Neurologic evaluation reveals -alert and oriented x 3 with no impairment of recent or remote memory. Mental Status-Normal.  Musculoskeletal Global Assessment -Note: no gross deformities.  Normal Exam - Left-Upper Extremity Strength Normal and Lower Extremity Strength Normal. Normal Exam - Right-Upper Extremity Strength Normal and Lower Extremity Strength Normal.  Lymphatic Head & Neck  General Head & Neck Lymphatics: Bilateral - Description - Normal. Axillary  General Axillary Region: Bilateral - Description - Normal. Tenderness - Non Tender. Femoral & Inguinal  Generalized Femoral & Inguinal Lymphatics: Bilateral - Description - No Generalized lymphadenopathy.    Assessment & Plan Angelica Klein MD; 02/08/2019 2:21 PM)  ABNORMAL MAMMOGRAM OF LEFT BREAST (R92.8) Impression: Pt has a discordant biopsy/mammogram wtih flat  epithelial atypia in the left breast, lower inner quadrant. She also has a biopsy proven fibroadenoma. We will need to do a seed localized excision of the flat epithelial atypia and will also excise fibroadenoma.  The surgical procedure was described to the patient. I discussed the incision type and location and that we would need radiology involved on with a seed marker.  The risks and benefits of the procedure were described to the patient and she wishes to proceed.  We discussed the risks bleeding, infection, damage to other structures, need for further procedures/surgeries. We discussed the risk of seroma. The patient was advised if the area in the breast in cancer, we may need to go back to surgery for additional tissue to obtain negative margins or for a lymph node biopsy. The patient was advised that these are the most common complications, but that others can occur as well. They were advised against taking aspirin or other anti-inflammatory agents/blood thinners the week before surgery.  Current Plans You are being scheduled for surgery- Our schedulers will call you.  You should hear from our office's scheduling department within 5 working days about the location, date, and time of surgery. We try to make accommodations for patient's preferences in scheduling surgery, but sometimes the OR schedule or the surgeon's schedule prevents [place] from making those accommodations.  If you have not heard from our office [phone]) in 5 working days, call the office and ask for your surgeon's nurse.  If you have other questions about your diagnosis, plan, or surgery, call the office and ask for your surgeon's nurse.  Pt Education - [facility] Breast Biopsy HCI: discussed with patient and provided information.   Signed by Angelica Klein, MD (02/08/2019 2:21 PM)

## 2019-03-04 NOTE — Anesthesia Procedure Notes (Signed)
Procedure Name: LMA Insertion Date/Time: 03/04/2019 9:19 AM Performed by: Gwyndolyn Saxon, CRNA Pre-anesthesia Checklist: Patient identified, Emergency Drugs available, Suction available and Patient being monitored Patient Re-evaluated:Patient Re-evaluated prior to induction Oxygen Delivery Method: Circle system utilized Preoxygenation: Pre-oxygenation with 100% oxygen Induction Type: IV induction Ventilation: Mask ventilation without difficulty LMA: LMA inserted LMA Size: 3.0 Number of attempts: 1 Placement Confirmation: positive ETCO2 and breath sounds checked- equal and bilateral Tube secured with: Tape Dental Injury: Teeth and Oropharynx as per pre-operative assessment

## 2019-03-04 NOTE — Op Note (Signed)
Left Breast Radioactive seed localized excisional biopsy, excision of left breast mass  Indications: This patient presents with history of abnormal left mammogram with discordant core needle biopsy and biopsy proven fibroadenoma  Pre-operative Diagnosis: abnormal left mammogram and fibroadenoma  Post-operative Diagnosis: Same  Surgeon: [name]   Anesthesia: General endotracheal anesthesia  ASA Class: 2  Procedure Details  The patient was seen in the Holding Room. The risks, benefits, complications, treatment options, and expected outcomes were discussed with the patient. The possibilities of bleeding, infection, the need for additional procedures, failure to diagnose a condition, and creating a complication requiring transfusion or operation were discussed with the patient. The patient concurred with the proposed plan, giving informed consent.  The site of surgery properly noted/marked. The patient was taken to Operating Room # 7, identified, and the procedure verified as Left Breast seed localized excisional biopsy and excision of left breast mass. A Time Out was held and the above information confirmed.  The left breast and chest were prepped and draped in standard fashion. The lumpectomy was performed by creating a inferior circumareolar incision between the mass and the previously placed radioactive seed.  Dissection was carried down to around the point of maximum signal intensity. The cautery was used to perform the dissection.  Hemostasis was achieved with cautery.  The specimen was inked with the margin marker paint kit.    Specimen radiography confirmed inclusion of the mammographic lesion, the clip, and the seed.  The background signal in the breast was zero.  The fibroadenoma was then removed via the same incision with the cautery.  The wound was irrigated and closed with 3-0 vicryl in layers and 4-0 monocryl subcuticular suture.      Sterile dressings were applied. At the end of  the operation, all sponge, instrument, and needle counts were correct.  Findings: grossly clear surgical margins   Estimated Blood Loss:  min         Specimens: left breast tissue with seed and left breast fibroadenoma         Complications:  None; patient tolerated the procedure well.         Disposition: PACU - hemodynamically stable.         Condition: stable

## 2019-03-04 NOTE — Transfer of Care (Signed)
Immediate Anesthesia Transfer of Care Note  Patient: Angelica Hopkins  Procedure(s) Performed: RADIOACTIVE SEED GUIDED EXCISIONAL LEFT BREAST BIOPSY AND EXCISION OF LEFT BREAST MASS (Left Breast)  Patient Location: PACU  Anesthesia Type:General  Level of Consciousness: drowsy  Airway & Oxygen Therapy: Patient Spontanous Breathing and Patient connected to face mask oxygen  Post-op Assessment: Report given to RN and Post -op Vital signs reviewed and stable  Post vital signs: Reviewed and stable  Last Vitals:  Vitals Value Taken Time  BP 117/76 03/04/19 1020  Temp    Pulse 87 03/04/19 1021  Resp 19 03/04/19 1021  SpO2 100 % 03/04/19 1021  Vitals shown include unvalidated device data.  Last Pain:  Vitals:   03/04/19 0804  TempSrc: Oral  PainSc: 0-No pain         Complications: No apparent anesthesia complications

## 2019-03-04 NOTE — Anesthesia Postprocedure Evaluation (Signed)
Anesthesia Post Note  Patient: Tamina Cyphers  Procedure(s) Performed: RADIOACTIVE SEED GUIDED EXCISIONAL LEFT BREAST BIOPSY AND EXCISION OF LEFT BREAST MASS (Left Breast)     Patient location during evaluation: PACU Anesthesia Type: General Level of consciousness: awake and alert Pain management: pain level controlled Vital Signs Assessment: post-procedure vital signs reviewed and stable Respiratory status: spontaneous breathing, nonlabored ventilation and respiratory function stable Cardiovascular status: blood pressure returned to baseline and stable Postop Assessment: no apparent nausea or vomiting Anesthetic complications: no    Last Vitals:  Vitals:   03/04/19 1112 03/04/19 1130  BP: 128/80 132/74  Pulse: 60 75  Resp: 17 18  Temp:  36.7 C  SpO2: 100% 100%    Last Pain:  Vitals:   03/04/19 1130  TempSrc:   PainSc: 0-No pain                 Lidia Collum

## 2019-03-04 NOTE — Interval H&P Note (Signed)
History and Physical Interval Note:  03/04/2019 8:57 AM  Angelica Hopkins  has presented today for surgery, with the diagnosis of ABNORMAL LEFT MAMMOGRAM.  The various methods of treatment have been discussed with the patient and family. After consideration of risks, benefits and other options for treatment, the patient has consented to  Procedure(s): RADIOACTIVE SEED GUIDED EXCISIONAL LEFT BREAST BIOPSY AND EXCISION OF LEFT BREAST MASS (Left) as a surgical intervention.  The patient's history has been reviewed, patient examined, no change in status, stable for surgery.  I have reviewed the patient's chart and labs.  Questions were answered to the patient's satisfaction.     [name]

## 2019-03-04 NOTE — Discharge Instructions (Addendum)
[place] Office Phone Number [phone]  BREAST BIOPSY/ LUMPECTOMY: POST OP INSTRUCTIONS  Always review your discharge instruction sheet given to you by the facility where your surgery was performed.  IF YOU HAVE DISABILITY OR FAMILY LEAVE FORMS, YOU MUST BRING THEM TO THE OFFICE FOR PROCESSING.  DO NOT GIVE THEM TO YOUR DOCTOR.  1. A prescription for pain medication may be given to you upon discharge.  Take your pain medication as prescribed, if needed.  If narcotic pain medicine is not needed, then you may take acetaminophen (Tylenol) (1,000mg  Tylenol taken at 8:15am - none until 2:15pm if needed) or ibuprofen (Advil) (NO ibuprofen until 4:00pm if needed) as needed. 2. Take your usually prescribed medications unless otherwise directed 3. If you need a refill on your pain medication, please contact your pharmacy.  They will contact our office to request authorization.  Prescriptions will not be filled after 5pm or on week-ends. 4. You should eat very light the first 24 hours after surgery, such as soup, crackers, pudding, etc.  Resume your normal diet the day after surgery. 5. Most patients will experience some swelling and bruising in the breast.  Ice packs and a good support bra will help.  Swelling and bruising can take several days to resolve.  6. It is common to experience some constipation if taking pain medication after surgery.  Increasing fluid intake and taking a stool softener will usually help or prevent this problem from occurring.  A mild laxative (Milk of Magnesia or Miralax) should be taken according to package directions if there are no bowel movements after 48 hours. 7. Unless discharge instructions indicate otherwise, you may remove your bandages 48 hours after surgery, and you may shower at that time.  You may have steri-strips (small skin tapes) in place directly over the incision.  These strips should be left on the skin for 7-10 days.   Any sutures or  staples will be removed at the office during your follow-up visit. 8. ACTIVITIES:  You may resume regular daily activities (gradually increasing) beginning the next day.  Wearing a good support bra or sports bra (or the breast binder) minimizes pain and swelling.  You may have sexual intercourse when it is comfortable. a. You may drive when you no longer are taking prescription pain medication, you can comfortably wear a seatbelt, and you can safely maneuver your car and apply brakes. b. RETURN TO WORK:  __________1 week_______________ 9. You should see your doctor in the office for a follow-up appointment approximately two weeks after your surgery.  Your doctors nurse will typically make your follow-up appointment when she calls you with your pathology report.  Expect your pathology report 2-3 business days after your surgery.  You may call to check if you do not hear from [place] after three days.   WHEN TO CALL YOUR DOCTOR: 1. Fever over 101.0 2. Nausea and/or vomiting. 3. Extreme swelling or bruising. 4. Continued bleeding from incision. 5. Increased pain, redness, or drainage from the incision.  The clinic staff is available to answer your questions during regular business hours.  Please dont hesitate to call and ask to speak to one of the nurses for clinical concerns.  If you have a medical emergency, go to the nearest emergency room or call 911.  A surgeon from [facility] is always on call at the hospital.  For further questions, please visit [email or web address]    Post Anesthesia Home Care Instructions  Activity: Get plenty of  rest for the remainder of the day. A responsible individual must stay with you for 24 hours following the procedure.  For the next 24 hours, DO NOT: -Drive a car -Paediatric nurse -Drink alcoholic beverages -Take any medication unless instructed by your physician -Make any legal decisions or sign important papers.  Meals: Start with  liquid foods such as gelatin or soup. Progress to regular foods as tolerated. Avoid greasy, spicy, heavy foods. If nausea and/or vomiting occur, drink only clear liquids until the nausea and/or vomiting subsides. Call your physician if vomiting continues.  Special Instructions/Symptoms: Your throat may feel dry or sore from the anesthesia or the breathing tube placed in your throat during surgery. If this causes discomfort, gargle with warm salt water. The discomfort should disappear within 24 hours.  If you had a scopolamine patch placed behind your ear for the management of post- operative nausea and/or vomiting:  1. The medication in the patch is effective for 72 hours, after which it should be removed.  Wrap patch in a tissue and discard in the trash. Wash hands thoroughly with soap and water. 2. You may remove the patch earlier than 72 hours if you experience unpleasant side effects which may include dry mouth, dizziness or visual disturbances. 3. Avoid touching the patch. Wash your hands with soap and water after contact with the patch.

## 2019-03-05 ENCOUNTER — Encounter (HOSPITAL_BASED_OUTPATIENT_CLINIC_OR_DEPARTMENT_OTHER): Payer: Self-pay | Admitting: General Surgery

## 2019-03-05 ENCOUNTER — Other Ambulatory Visit: Payer: Self-pay | Admitting: *Deleted

## 2019-03-05 DIAGNOSIS — B20 Human immunodeficiency virus [HIV] disease: Secondary | ICD-10-CM

## 2019-03-05 MED ORDER — BIKTARVY 50-200-25 MG PO TABS: ORAL_TABLET | ORAL | 2 refills | Status: DC

## 2019-04-01 ENCOUNTER — Encounter: Payer: Self-pay | Admitting: *Deleted

## 2019-05-27 ENCOUNTER — Telehealth: Payer: Self-pay

## 2019-05-27 NOTE — Telephone Encounter (Signed)
Patient sent appointment request via mychart requesting an appointment for  "Mental health, need a referral asap". Offered patient an appointment with Counselor Jessica Priest on 11/5 at 3. Will forward message to Janene Madeira, Np so she is aware. Waiting on patient to confirm if appointment is okay. Mellen

## 2019-05-27 NOTE — Telephone Encounter (Signed)
Scheduled patient an appt to see Marcie Bal next Thursday.  Kimberly

## 2019-05-27 NOTE — Telephone Encounter (Signed)
Yes please try to get her in to see Marcie Bal ASAP for support.   Angelica Hopkins has a crisis center also that she can do a walk in assessment.  Address: [address] [place], [place] [address] Hours: Closes 5PM Phone: [phone]  Thank you - She is likely to respond with MyChart also if you want to use that avenue too.

## 2019-06-03 ENCOUNTER — Ambulatory Visit: Payer: Self-pay

## 2019-06-14 ENCOUNTER — Other Ambulatory Visit: Payer: Self-pay | Admitting: Infectious Diseases

## 2019-06-14 DIAGNOSIS — B20 Human immunodeficiency virus [HIV] disease: Secondary | ICD-10-CM

## 2019-06-15 ENCOUNTER — Encounter: Payer: Self-pay | Admitting: Infectious Diseases

## 2019-06-15 ENCOUNTER — Ambulatory Visit (INDEPENDENT_AMBULATORY_CARE_PROVIDER_SITE_OTHER): Payer: Self-pay | Admitting: Infectious Diseases

## 2019-06-15 ENCOUNTER — Other Ambulatory Visit: Payer: Self-pay

## 2019-06-15 ENCOUNTER — Ambulatory Visit: Payer: Self-pay

## 2019-06-15 VITALS — BP 120/77 | HR 80 | Temp 98.4°F | Wt 137.6 lb

## 2019-06-15 DIAGNOSIS — B20 Human immunodeficiency virus [HIV] disease: Secondary | ICD-10-CM

## 2019-06-15 DIAGNOSIS — B181 Chronic viral hepatitis B without delta-agent: Secondary | ICD-10-CM

## 2019-06-15 DIAGNOSIS — J4521 Mild intermittent asthma with (acute) exacerbation: Secondary | ICD-10-CM

## 2019-06-15 DIAGNOSIS — R87611 Atypical squamous cells cannot exclude high grade squamous intraepithelial lesion on cytologic smear of cervix (ASC-H): Secondary | ICD-10-CM

## 2019-06-15 DIAGNOSIS — R7989 Other specified abnormal findings of blood chemistry: Secondary | ICD-10-CM

## 2019-06-15 DIAGNOSIS — F418 Other specified anxiety disorders: Secondary | ICD-10-CM

## 2019-06-15 DIAGNOSIS — G43909 Migraine, unspecified, not intractable, without status migrainosus: Secondary | ICD-10-CM

## 2019-06-15 MED ORDER — BIKTARVY 50-200-25 MG PO TABS: ORAL_TABLET | ORAL | 11 refills | Status: DC

## 2019-06-15 MED ORDER — ALBUTEROL SULFATE HFA 108 (90 BASE) MCG/ACT IN AERS: 1.0000 | INHALATION_SPRAY | Freq: Four times a day (QID) | RESPIRATORY_TRACT | 3 refills | Status: DC | PRN

## 2019-06-15 NOTE — Assessment & Plan Note (Signed)
Angelica Hopkins has done well on her [place] Odor and has had excellent control of her condition.  She is not currently desiring pregnancy so we will continue this regimen for her. We will update her HIV related lab work today. She can return for follow-up in 6 months.

## 2019-06-15 NOTE — Assessment & Plan Note (Signed)
I given her the number for follow-up with the gynecology team.  She will call to make an appointment at her earliest convenience.

## 2019-06-15 NOTE — Progress Notes (Signed)
Name: Angelica Hopkins  DOB: 02-15-1981  MRN: [id number] PCP: Patient, No Pcp Per    Patient Active Problem List   Diagnosis Date Noted  . Human immunodeficiency virus (HIV) disease ([place]) 11/11/2008    Priority: High  . Atypical squamous cells cannot exclude high grade squamous intraepithelial lesion on cytologic smear of cervix (ASC-H) 01/15/2019  . Migraines 07/18/2017  . Hepatitis B infection 11/15/2015  . Anxiety associated with depression 12/14/2014  . Neuropathy 05/28/2011  . Excessive and frequent menstruation 10/27/2009    SUBJECTIVE:  Brief Narrative:  Angelica Hopkins is a 38 y.o. AA female with HIV Dx 2005. Deferred treatment with ART x 5 years. Re-engaged in care 09/2008 and was previously on Genvoya. HIV Risk: heterosexual, parents HIV+ (was not perinatally infected) History of OIs: none. She is a [place]. Separated from her husband and has a teenage daughter (HIV-).   Previous Regimens:   Genvoya 2010 >> suppressed, inconsistent use   Biktarvy 2018 >> suppressed   Odefsey >> switched in preparation for pregnancy  Genotype:   04/2017 - sensitive   CC:  HIV follow up care.  Increased anxiety and depression lately.   HPI:  Angelica Hopkins is here today and appears quite sad and tearful.  She reports missing no doses of her Biktarvy and is still highly motivated to take her medication to treat her HIV and chronic hepatitis B.  However she has been struggling with low motivation to do nearly anything else.  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  She does no longer feels filled in her job and has a hard time finding joy.  She and her husband are currently looking to buy a house however and she knows that she needs to continue  to working to make this happen.  She feels very conflicted and stuck.  She has been having panic attacks lately.  She has had to take her hydroxyzine twice in the last month.  She also needs to use her inhaler more often to catch her breath.  She needs a refill of this today. She is no longer actively wishing for pregnancy but not doing anything to prevent it.  She feels that she is "too old and broken with her conditions."  Her migraines have been increased due to her stress and all of the screen time lately.  She is not interested in going back on her amitriptyline.  She is due for a Pap smear now per gynecology team.  She needs the number to schedule a follow-up.   Review of Systems  All other systems reviewed and are negative.  Past Medical History:  Diagnosis Date  . Abnormal Pap smear of cervix    LSISL, colposcopy 2012  . Asthma   . Depression   . Hepatitis B infection   . HIV (human immunodeficiency virus infection) ([place])   . 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 Panic disorder   . Vitamin D deficiency     Social History   Tobacco Use  . Smoking status: Current Every Day Smoker    Packs/day: 0.25    Types: Cigarettes    Last attempt to quit: 10/14/2017    Years since quitting: 1.6  . Smokeless tobacco:  Never Used  Substance Use Topics  . Alcohol use: No  . Drug use: Not Currently   Outpatient Medications Prior to Visit  Medication Sig Dispense Refill  . hydrOXYzine (ATARAX/VISTARIL) 25 MG tablet Take 25 mg by mouth 3 (three) times daily as needed.    Marland Kitchen albuterol (PROVENTIL HFA;VENTOLIN HFA) 108 (90 Base) MCG/ACT inhaler Inhale 1-2 puffs into the lungs every 6 (six) hours as needed for wheezing or shortness of breath. 1 Inhaler 3  . bictegravir-emtricitabine-tenofovir AF (BIKTARVY) 50-200-25 MG TABS tablet TAKE 1 TABLET BY MOUTH EVERY DAY WITH OR WITHOUT FOOD AT THE SAME TIME EACH DAY 30 tablet 2  . oxyCODONE (OXY IR/ROXICODONE) 5 MG immediate release tablet Take 1 tablet  (5 mg total) by mouth every 6 (six) hours as needed for severe pain. (Patient not taking: Reported on 06/15/2019) 15 tablet 0   No facility-administered medications prior to visit.    Allergies  Allergen Reactions  . Hydrocodone-Acetaminophen Itching    Patient reported    Social History   Socioeconomic History  . Marital status: Legally Separated    Spouse name: Not on file  . Number of children: 1  . Years of education: Not on file  . Highest education level: Bachelor's degree (e.g., BA, AB, BS)  Occupational History  . Occupation: Pharmacist, hospital  Social Needs  . Financial resource strain: Not very hard  . Food insecurity    Worry: Never true    Inability: Never true  . Transportation needs    Medical: No    Non-medical: No  Tobacco Use  . Smoking status: Current Every Day Smoker    Packs/day: 0.25    Types: Cigarettes    Last attempt to quit: 10/14/2017    Years since quitting: 1.6  . Smokeless tobacco: Never Used  Substance and Sexual Activity  . Alcohol use: No  . Drug use: Not Currently  . Sexual activity: Yes    Partners: Male    Birth control/protection: None  Lifestyle  . Physical activity    Days per week: Not on file    Minutes per session: Not on file  . Stress: Not on file  Relationships  . Social Herbalist on phone: Not on file    Gets together: Not on file    Attends religious service: Not on file    Active member of club or organization: Not on file    Attends meetings of clubs or organizations: Not on file    Relationship status: Not on file  Other Topics Concern  . Not on file  Social History Narrative  . Not on file     OBJECTIVE: Vitals:   06/15/19 1536  BP: 120/77  Pulse: 80  Temp: 98.4 F (36.9 C)  TempSrc: Oral  Weight: 137 lb 9.6 oz (62.4 kg)   Body mass index is 24.37 kg/m.  Physical Exam  Constitutional: She is oriented to person, place, and time and well-developed, well-nourished, and in no distress.  HENT:   Mouth/Throat: Oropharynx is clear and moist. No oral lesions. No dental abscesses.  Eyes: Pupils are equal, round, and reactive to light.  Cardiovascular: Normal rate, regular rhythm and normal heart sounds.  No murmur heard. Pulmonary/Chest: Effort normal and breath sounds normal. No respiratory distress.  Abdominal: Soft. She exhibits no distension. There is no abdominal tenderness.  Musculoskeletal: Normal range of motion.        General: No tenderness.  Lymphadenopathy:    She has no  cervical adenopathy.  Neurological: She is alert and oriented to person, place, and time.  Skin: Skin is warm and dry. No rash noted.  Psychiatric: Mood, affect and judgment normal.  Tearful today.  Vitals reviewed.  HIV 1 RNA Quant (copies/mL)  Date Value  01/21/2019 <20 NOT DETECTED  08/13/2018 <20 NOT DETECTED  04/02/2018 <20 DETECTED (A)   CD4 T Cell Abs (/uL)  Date Value  01/21/2019 701  08/13/2018 830  04/02/2018 730   Lab Results  Component Value Date   HAV NON-REACTIVE 05/12/2017   HBEAG REACTIVE (A) 01/21/2019   HEPCAB NON-REACTIVE 05/12/2017   Lab Results  Component Value Date   WBC 4.5 08/13/2018   HGB 10.7 (L) 08/13/2018   HCT 32.0 (L) 08/13/2018   MCV 87.0 08/13/2018   PLT 228 08/13/2018   Lab Results  Component Value Date   CREATININE 1.02 08/13/2018   CREATININE 1.06 (H) 08/04/2018   CREATININE 1.07 04/02/2018   Lab Results  Component Value Date   ALT 43 (H) 01/21/2019   AST 40 (H) 01/21/2019   BILITOT 0.3 01/21/2019    Problem List Items Addressed This Visit      High   Human immunodeficiency virus (HIV) disease ([place])    Rayla has done well on her Biktarvy and has had excellent control of her condition.  She is not currently desiring pregnancy so we will continue this regimen for her. We will update her HIV related lab work today. She can return for follow-up in 6 months.      Relevant Medications   bictegravir-emtricitabine-tenofovir AF (BIKTARVY)  50-200-25 MG TABS tablet   Other Relevant Orders   HIV 1 RNA quant-no reflex-bld   T-helper cell (CD4)- ([facility] clinic only)     Unprioritized   Migraines    She will continue off medications for now.  Happy to help refer her to neurology if she is not interested in resuming her amitriptyline and consideration for another option. Suggested bluelight blocking glasses to help with some of the screen time.      Hepatitis B infection - Primary    We will repeat her hepatitis B lab work today.  She has had a persistently positive hepatitis B E antigen and a low viral load.  Ideally she will never stop her Biktarvy so that she may hopefully seroconvert 1 day.  Unlikely that she will clear this infection and will always require treatment in conjunction with her HIV.      Relevant Medications   bictegravir-emtricitabine-tenofovir AF (BIKTARVY) 50-200-25 MG TABS tablet   Other Relevant Orders   COMPLETE METABOLIC PANEL WITH GFR   Hepatitis B DNA, ultraquantitative, PCR   Hepatitis B E Antigen   Atypical squamous cells cannot exclude high grade squamous intraepithelial lesion on cytologic smear of cervix (ASC-H)    I given her the number for follow-up with the gynecology team.  She will call to make an appointment at her earliest convenience.      Anxiety associated with depression    She was unable to make a visit with Marcie Bal in the office recently due to uncontrolled anxiety.  We will set her up with another appointment to do a virtual visit so she can begin working on her mental health needs.  She is resistant to use an SSRI as she has failed Prozac in the past.  It makes her feel "underwater."  I asked her to consider the suffering she has been going through lately however and if it  is worth it knowing that we could possibly find the right medicine for her.  She will consider and let me know. She is brought another journal and will start writing again for therapeutic effect. We will continue her  hydroxyzine as she finds this helpful.      Relevant Medications   hydrOXYzine (ATARAX/VISTARIL) 25 MG tablet    Other Visit Diagnoses    Mild intermittent asthma with exacerbation       Relevant Medications   albuterol (VENTOLIN HFA) 108 (90 Base) MCG/ACT inhaler     Janene Madeira, MSN, NP-C [facility] [email or web address] Pager: [phone] Office: [phone] [place]: [phone]

## 2019-06-15 NOTE — Assessment & Plan Note (Signed)
She will continue off medications for now.  Happy to help refer her to neurology if she is not interested in resuming her amitriptyline and consideration for another option. Suggested bluelight blocking glasses to help with some of the screen time.

## 2019-06-15 NOTE — Patient Instructions (Addendum)
So nice to see you!   Let me know if you need some help with what we talked about today. Please schedule an appointment with our counselor Marcie Bal - can be virtual.   For the pap smear you can schedule at [phone].   Please return in 6 months for routine care.   I will let you know today's blood work once it is available to me.    Bluelight blocking glasses may be helpful for you given it is increasing your migraines.  You can look into getting some off of [place] pretty cheap.  I use some myself.

## 2019-06-15 NOTE — Assessment & Plan Note (Signed)
We will repeat her hepatitis B lab work today.  She has had a persistently positive hepatitis B E antigen and a low viral load.  Ideally she will never stop her Biktarvy so that she may hopefully seroconvert 1 day.  Unlikely that she will clear this infection and will always require treatment in conjunction with her HIV.

## 2019-06-15 NOTE — Assessment & Plan Note (Addendum)
She was unable to make a visit with Marcie Bal in the office recently due to uncontrolled anxiety.  We will set her up with another appointment to do a virtual visit so she can begin working on her mental health needs.  She is resistant to use an SSRI as she has failed Prozac in the past.  It makes her feel "underwater."  I asked her to consider the suffering she has been going through lately however and if it is worth it knowing that we could possibly find the right medicine for her.  She will consider and let me know. She is brought another journal and will start writing again for therapeutic effect. We will continue her hydroxyzine as she finds this helpful.

## 2019-06-16 ENCOUNTER — Encounter: Payer: Self-pay | Admitting: Infectious Diseases

## 2019-06-16 LAB — T-HELPER CELL (CD4) - (RCID CLINIC ONLY)
CD4 % Helper T Cell: 38 % (ref 33–65)
CD4 T Cell Abs: 927 /uL (ref 400–1790)

## 2019-06-23 LAB — COMPLETE METABOLIC PANEL WITH GFR
AG Ratio: 1.2 (calc) (ref 1.0–2.5)
ALT: 46 U/L — ABNORMAL HIGH (ref 6–29)
AST: 46 U/L — ABNORMAL HIGH (ref 10–30)
Albumin: 4.4 g/dL (ref 3.6–5.1)
Alkaline phosphatase (APISO): 35 U/L (ref 31–125)
BUN: 11 mg/dL (ref 7–25)
CO2: 24 mmol/L (ref 20–32)
Calcium: 9.7 mg/dL (ref 8.6–10.2)
Chloride: 103 mmol/L (ref 98–110)
Creat: 0.95 mg/dL (ref 0.50–1.10)
GFR, Est African American: 90 mL/min/{1.73_m2} (ref >=60)
GFR, Est Non African American: 78 mL/min/{1.73_m2} (ref >=60)
Globulin: 3.7 g/dL (calc) (ref 1.9–3.7)
Glucose, Bld: 108 mg/dL — ABNORMAL HIGH (ref 65–99)
Potassium: 3.9 mmol/L (ref 3.5–5.3)
Sodium: 137 mmol/L (ref 135–146)
Total Bilirubin: 0.5 mg/dL (ref 0.2–1.2)
Total Protein: 8.1 g/dL (ref 6.1–8.1)

## 2019-06-23 LAB — HIV-1 RNA QUANT-NO REFLEX-BLD
HIV 1 RNA Quant: <20 copies/mL
HIV-1 RNA Quant, Log: <1.3 Log copies/mL

## 2019-06-23 LAB — HEPATITIS B DNA, ULTRAQUANTITATIVE, PCR
Hepatitis B DNA (Calc): 1.2 Log IU/mL — ABNORMAL HIGH
Hepatitis B DNA: 16 IU/mL — ABNORMAL HIGH

## 2019-06-23 NOTE — Addendum Note (Signed)
Addended by: Longwood Callas on: 06/23/2019 02:52 PM   Modules accepted: Orders

## 2019-06-23 NOTE — Progress Notes (Signed)
LFTs remain elevated despite very low levels of Hep B. She is still eAg (+). Will inquire about alcohol / tylenol use and arrange follow up ultrasound of liver to assess for any changes; previously elastography indicated F2-3 fibrosis risk 04/2017.  Would like to get her in to see GI also to evaluate for other contributors especially given she has desire for pregnancy.

## 2019-07-01 ENCOUNTER — Other Ambulatory Visit: Payer: Self-pay

## 2019-07-01 ENCOUNTER — Ambulatory Visit: Payer: Self-pay

## 2019-07-01 ENCOUNTER — Other Ambulatory Visit: Payer: No Typology Code available for payment source

## 2019-07-06 ENCOUNTER — Ambulatory Visit (HOSPITAL_COMMUNITY)
Admission: RE | Admit: 2019-07-06 | Discharge: 2019-07-06 | Disposition: A | Discharge status: Home / Self Care | Payer: Self-pay | Source: Ambulatory Visit | Attending: Infectious Diseases | Admitting: Infectious Diseases

## 2019-07-06 ENCOUNTER — Other Ambulatory Visit: Payer: Self-pay

## 2019-07-06 DIAGNOSIS — B181 Chronic viral hepatitis B without delta-agent: Secondary | ICD-10-CM | POA: Insufficient documentation

## 2019-07-08 ENCOUNTER — Ambulatory Visit: Payer: Self-pay

## 2019-07-08 ENCOUNTER — Other Ambulatory Visit: Payer: Self-pay

## 2019-07-15 ENCOUNTER — Ambulatory Visit: Payer: Self-pay

## 2019-08-02 ENCOUNTER — Ambulatory Visit: Payer: No Typology Code available for payment source | Admitting: Physician Assistant

## 2019-08-12 ENCOUNTER — Telehealth: Payer: Self-pay

## 2019-08-12 NOTE — Telephone Encounter (Signed)
Left patient a voice mail to call and schedule an appointment with Marcie Bal for next week 08/19/19 at 4pm

## 2019-09-02 ENCOUNTER — Ambulatory Visit: Payer: Self-pay

## 2019-09-02 ENCOUNTER — Other Ambulatory Visit: Payer: Self-pay

## 2019-10-21 ENCOUNTER — Other Ambulatory Visit: Payer: Self-pay | Admitting: Infectious Diseases

## 2019-10-22 ENCOUNTER — Ambulatory Visit: Payer: Medicaid Other | Attending: Internal Medicine

## 2019-10-22 DIAGNOSIS — L0231 Cutaneous abscess of buttock: Secondary | ICD-10-CM

## 2019-10-22 DIAGNOSIS — Z23 Encounter for immunization: Secondary | ICD-10-CM

## 2019-10-22 HISTORY — DX: Cellulitis of buttock: L02.31

## 2019-10-25 ENCOUNTER — Other Ambulatory Visit: Payer: Self-pay

## 2019-10-25 DIAGNOSIS — F418 Other specified anxiety disorders: Secondary | ICD-10-CM

## 2019-10-25 MED ORDER — HYDROXYZINE HCL 25 MG PO TABS: 25.0000 mg | ORAL_TABLET | Freq: Three times a day (TID) | ORAL | 3 refills | Status: DC | PRN

## 2019-11-22 ENCOUNTER — Ambulatory Visit: Payer: Medicaid Other | Attending: Internal Medicine

## 2019-11-22 DIAGNOSIS — Z23 Encounter for immunization: Secondary | ICD-10-CM

## 2019-11-22 NOTE — Progress Notes (Signed)
   Covid-19 Vaccination Clinic  Name:  Angelica Hopkins    MRN: [id number] DOB: 04-Oct-1980  11/22/2019  [name] was observed post Covid-19 immunization for 15 minutes without incident. She was provided with Vaccine Information Sheet and instruction to access the V-Safe system.   [name] was instructed to call 911 with any severe reactions post vaccine: Marland Kitchen Difficulty breathing  . Swelling of face and throat  . A fast heartbeat  . A bad rash all over body  . Dizziness and weakness   Immunizations Administered    Name Date Dose VIS Date Route   Pfizer COVID-19 Vaccine 11/22/2019  3:11 PM 0.3 mL 09/22/2018 Intramuscular   Manufacturer: [place]   Lot: [id number]   [place]: [id number]

## 2019-12-09 ENCOUNTER — Other Ambulatory Visit: Payer: Self-pay | Admitting: Infectious Diseases

## 2019-12-09 DIAGNOSIS — B181 Chronic viral hepatitis B without delta-agent: Secondary | ICD-10-CM

## 2019-12-09 DIAGNOSIS — B20 Human immunodeficiency virus [HIV] disease: Secondary | ICD-10-CM

## 2019-12-13 ENCOUNTER — Other Ambulatory Visit: Payer: BC Managed Care – PPO

## 2019-12-13 ENCOUNTER — Other Ambulatory Visit: Payer: Self-pay

## 2019-12-13 DIAGNOSIS — B181 Chronic viral hepatitis B without delta-agent: Secondary | ICD-10-CM

## 2019-12-13 DIAGNOSIS — B20 Human immunodeficiency virus [HIV] disease: Secondary | ICD-10-CM

## 2019-12-14 LAB — T-HELPER CELL (CD4) - (RCID CLINIC ONLY)
CD4 % Helper T Cell: 44 % (ref 33–65)
CD4 T Cell Abs: 961 /uL (ref 400–1790)

## 2019-12-15 LAB — HIV-1 RNA QUANT-NO REFLEX-BLD
HIV 1 RNA Quant: <20 copies/mL
HIV-1 RNA Quant, Log: <1.3 Log copies/mL

## 2019-12-15 LAB — HEPATIC FUNCTION PANEL
AG Ratio: 1.3 (calc) (ref 1.0–2.5)
ALT: 53 U/L — ABNORMAL HIGH (ref 6–29)
AST: 50 U/L — ABNORMAL HIGH (ref 10–30)
Albumin: 4.4 g/dL (ref 3.6–5.1)
Alkaline phosphatase (APISO): 33 U/L (ref 31–125)
Bilirubin, Direct: 0.1 mg/dL (ref 0.0–0.2)
Globulin: 3.3 g/dL (calc) (ref 1.9–3.7)
Indirect Bilirubin: 0.3 mg/dL (calc) (ref 0.2–1.2)
Total Bilirubin: 0.4 mg/dL (ref 0.2–1.2)
Total Protein: 7.7 g/dL (ref 6.1–8.1)

## 2019-12-15 LAB — CBC
HCT: 38.1 % (ref 35.0–45.0)
Hemoglobin: 12.6 g/dL (ref 11.7–15.5)
MCH: 28.7 pg (ref 27.0–33.0)
MCHC: 33.1 g/dL (ref 32.0–36.0)
MCV: 86.8 fL (ref 80.0–100.0)
MPV: 10.8 fL (ref 7.5–12.5)
Platelets: 222 10*3/uL (ref 140–400)
RBC: 4.39 10*6/uL (ref 3.80–5.10)
RDW: 12.1 % (ref 11.0–15.0)
WBC: 4.4 10*3/uL (ref 3.8–10.8)

## 2019-12-15 LAB — HEPATITIS B E ANTIGEN: Hep B E Ag: REACTIVE — AB

## 2019-12-15 LAB — HEPATITIS B DNA, ULTRAQUANTITATIVE, PCR
Hepatitis B DNA (Calc): 2.76 Log IU/mL — ABNORMAL HIGH
Hepatitis B DNA: 581 IU/mL — ABNORMAL HIGH

## 2019-12-15 LAB — HEPATITIS B SURFACE ANTIGEN: Hepatitis B Surface Ag: REACTIVE — AB

## 2019-12-16 ENCOUNTER — Encounter: Payer: Self-pay | Admitting: Family Medicine

## 2019-12-16 ENCOUNTER — Ambulatory Visit: Payer: No Typology Code available for payment source | Admitting: Family Medicine

## 2019-12-16 NOTE — Progress Notes (Signed)
Patient did not keep appointment today. She may call to reschedule.  

## 2019-12-28 ENCOUNTER — Ambulatory Visit (INDEPENDENT_AMBULATORY_CARE_PROVIDER_SITE_OTHER): Payer: BC Managed Care – PPO | Admitting: Infectious Diseases

## 2019-12-28 ENCOUNTER — Encounter: Payer: Self-pay | Admitting: Infectious Diseases

## 2019-12-28 ENCOUNTER — Other Ambulatory Visit: Payer: Self-pay

## 2019-12-28 ENCOUNTER — Encounter: Payer: Self-pay | Admitting: Gastroenterology

## 2019-12-28 VITALS — BP 124/82 | HR 69 | Wt 124.2 lb

## 2019-12-28 DIAGNOSIS — B181 Chronic viral hepatitis B without delta-agent: Secondary | ICD-10-CM

## 2019-12-28 DIAGNOSIS — B20 Human immunodeficiency virus [HIV] disease: Secondary | ICD-10-CM

## 2019-12-28 DIAGNOSIS — R2231 Localized swelling, mass and lump, right upper limb: Secondary | ICD-10-CM | POA: Diagnosis not present

## 2019-12-28 DIAGNOSIS — R7989 Other specified abnormal findings of blood chemistry: Secondary | ICD-10-CM

## 2019-12-28 DIAGNOSIS — R87611 Atypical squamous cells cannot exclude high grade squamous intraepithelial lesion on cytologic smear of cervix (ASC-H): Secondary | ICD-10-CM

## 2019-12-28 NOTE — Progress Notes (Signed)
Name: Angelica Hopkins  DOB: [birth year]  MRN: [id number] PCP: Sloan Leiter, MD      Patient Active Problem List   Diagnosis Date Noted  . Human immunodeficiency virus (HIV) disease ([place]) 11/11/2008    Priority: High  . Nodule of skin of right upper extremity 12/31/2019  . Atypical squamous cells cannot exclude high grade squamous intraepithelial lesion on cytologic smear of cervix (ASC-H) 01/15/2019  . Migraines 07/18/2017  . Hepatitis B infection 11/15/2015  . Anxiety associated with depression 12/14/2014  . Neuropathy 05/28/2011  . Excessive and frequent menstruation 10/27/2009    SUBJECTIVE:  Brief Narrative:  Adanya Kittrell is a 39 y.o. AA female with HIV Dx 2005. Deferred treatment with ART x 5 years.  HIV Risk: heterosexual, parents HIV+ (was not perinatally infected) History of OIs: none.   Previous Regimens:   Genvoya 2010 >> suppressed, inconsistent use   Biktarvy 2018 >> suppressed    Genotype:   04/2017 - sensitive   CC:  HIV follow up care Hep B follow up care  Nodule on the right anterior fossa that is painful     HPI:  Sheretha is doing well. 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 She is very happy about this and now has access to insurance through work.   She continues on her Biktarvy everyday without any missed doses, side effects or concerns over access to medication. Sexually active with one female partner, her husband. Requesting if he can get blood work screening done here.   She is due for a Pap smear and has FU with GYN. One episode of abnormal bleeding she recalls.   Has a painful nodule in the bend of her right elbow. She is not 100% sure when it started but has noticed it more over the last month or so as it is now painful when she bends her arm. It has not changed in size or shape. No erythema. She states she did previously had an IV in this vein (or close to it) and she pulled it out  traumatically during previous hospitalization.    Review of Systems  All other systems reviewed and are negative.   Past Medical History:  Diagnosis Date  . Abnormal Pap smear of cervix    LSISL, colposcopy 2012  . Asthma   . Depression   . Hepatitis B infection   . HIV (human immunodeficiency virus infection) ([place])   . 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 Panic disorder   . Vitamin D deficiency     Social History   Tobacco Use  . Smoking status: Former Smoker    Types: Cigarettes    Quit date: 10/14/2017    Years since quitting: 2.2  . Smokeless tobacco: Never Used  . Tobacco comment: Quit 3 weeks ago  Substance Use Topics  . Alcohol use: No  . Drug use: Not Currently   Outpatient Medications Prior to Visit  Medication Sig Dispense Refill  . bictegravir-emtricitabine-tenofovir AF (BIKTARVY) 50-200-25 MG TABS tablet TAKE 1 TABLET BY MOUTH EVERY DAY WITH OR WITHOUT FOOD AT THE SAME TIME EACH DAY 30 tablet 11  . albuterol (VENTOLIN HFA) 108 (90 Base) MCG/ACT inhaler Inhale 1-2 puffs into the lungs every 6 (six) hours as needed for wheezing or shortness of breath. 18 g 3  . hydrOXYzine (ATARAX/VISTARIL) 25 MG tablet Take 1 tablet (25 mg total) by mouth 3 (three) times daily as needed.  30 tablet 3   No facility-administered medications prior to visit.   Allergies  Allergen Reactions  . Hydrocodone-Acetaminophen Itching    Patient reported    Social History   Socioeconomic History  . Marital status: Legally Separated    Spouse name: Not on file  . Number of children: 1  . Years of education: Not on file  . Highest education level: Bachelor's degree (e.g., BA, AB, BS)  Occupational History  . Occupation: Pharmacist, hospital  Tobacco Use  . Smoking status: Former Smoker    Types: Cigarettes    Quit date: 10/14/2017    Years since quitting: 2.2  . Smokeless tobacco: Never Used  . Tobacco comment: Quit 3 weeks ago  Substance and Sexual Activity  . Alcohol use: No  . Drug use:  Not Currently  . Sexual activity: Yes    Partners: Male    Birth control/protection: None  Other Topics Concern  . Not on file  Social History Narrative  . Not on file   Social Determinants of Health   Financial Resource Strain:   . Difficulty of Paying Living Expenses:   Food Insecurity:   . Worried About Charity fundraiser in the Last Year:   . Arboriculturist in the Last Year:   Transportation Needs:   . Film/video editor (Medical):   Marland Kitchen Lack of Transportation (Non-Medical):   Physical Activity:   . Days of Exercise per Week:   . Minutes of Exercise per Session:   Stress:   . Feeling of Stress :   Social Connections:   . Frequency of Communication with Friends and Family:   . Frequency of Social Gatherings with Friends and Family:   . Attends Religious Services:   . Active Member of Clubs or Organizations:   . Attends Archivist Meetings:   Marland Kitchen Marital Status:      OBJECTIVE: Vitals:   12/28/19 1530  BP: 124/82  Pulse: 69  Weight: 124 lb 3.2 oz (56.3 kg)   Body mass index is 22 kg/m.   Physical Exam  Constitutional: She is oriented to person, place, and time and well-developed, well-nourished, and in no distress.  HENT:  Mouth/Throat: Oropharynx is clear and moist. No oral lesions. No dental abscesses.  Eyes: Pupils are equal, round, and reactive to light.  Cardiovascular: Normal rate, regular rhythm and normal heart sounds.  No murmur heard. Pulmonary/Chest: Effort normal and breath sounds normal. No respiratory distress.  Abdominal: Soft. She exhibits no distension. There is no abdominal tenderness.  Musculoskeletal:        General: No tenderness. Normal range of motion.  Lymphadenopathy:    She has no cervical adenopathy.  Neurological: She is alert and oriented to person, place, and time.  Skin: Skin is warm and dry. No rash noted.  Psychiatric: Mood, affect and judgment normal.  In good spirits.   Vitals reviewed.  HIV 1 RNA Quant  (copies/mL)  Date Value  12/13/2019 <20 NOT DETECTED  06/15/2019 <20 NOT DETECTED  01/21/2019 <20 NOT DETECTED   CD4 T Cell Abs (/uL)  Date Value  12/13/2019 961  06/15/2019 927  01/21/2019 701   Lab Results  Component Value Date   HAV NON-REACTIVE 05/12/2017   HBEAG REACTIVE (A) 12/13/2019   HEPCAB NON-REACTIVE 05/12/2017   Lab Results  Component Value Date   WBC 4.4 12/13/2019   HGB 12.6 12/13/2019   HCT 38.1 12/13/2019   MCV 86.8 12/13/2019   PLT 222 12/13/2019  Lab Results  Component Value Date   CREATININE 0.95 06/15/2019   CREATININE 1.02 08/13/2018   CREATININE 1.06 (H) 08/04/2018   Lab Results  Component Value Date   ALT 53 (H) 12/13/2019   AST 50 (H) 12/13/2019   BILITOT 0.4 12/13/2019    Problem List Items Addressed This Visit      High   Human immunodeficiency virus (HIV) disease ([place]) - Primary    She continues to do very well on Biktarvy once a day. VL again undetectable and CD4 961. No changes for her. Will repeat pertinent labs in 6 months and continue to monitor.  No drug interactions identified today.         Unprioritized   Hepatitis B infection    Increased VL > 500 copies indicating some break though despite Vemlidy once daily. She has also persistently been hepatitis e Ag (+) and mildly elevated LFTs < 2x ULN.  Last Fibroscan revealed F2-3. Will arrange to repeat this scan to follow for changes. Awaiting new insurance and would like to call for cost assessment.   We discussed referral to GI to consider other unrelated causes for elevated LFTs given her viral load is still relatively low (previously was 188 million.  HBV quant > 500 does put her at increased risk to transmit to partners through blood/sex - her husband will call Health Department for testing to screen for hep b and immunity. Recommend he get vaccines if not previously given or does not have demonstrated immunity.   We also discussed augmenting regimen with adding Entecavir  for her - she would like to meet with GI first to identify other causes and repeat hep b DNA again in 3 months. I do not think she is missing Biktarvy doses and explained that she should continue with good adherence given it is treating both.   I do not think she would tolerate interferon therapy given her frequent episodes of anemia.   Return in about 3 months (around 03/29/2020).       Relevant Orders   Ambulatory referral to Gastroenterology   Hepatitis B DNA, ultraquantitative, PCR   Hepatic function panel   Atypical squamous cells cannot exclude high grade squamous intraepithelial lesion on cytologic smear of cervix (ASC-H)    FU with GYN in July. Needs repeated Pap at that time.       Nodule of skin of right upper extremity    Tender to the Right anterior AC space with notable firm nodule. Non pulsatile. Does not feel like a valve. ?damage from previous IV site vs thrombophlebitis but the time frame would make that off. Will send for RUE duplex ultrasound.  She will continue to monitor for changes.       Relevant Orders   [place] UPPER EXTREMITY DUPLEX RIGHT (NON-WBI)    Other Visit Diagnoses    Elevated LFTs         Janene Madeira, MSN, NP-C [facility] [email or web address] Pager: [phone] Office: [phone] [place]: [phone]

## 2019-12-28 NOTE — Patient Instructions (Addendum)
Nice to see you!  Please continue your Biktarvy every day as you are.   [place] --> please have your husband call for an appointment to get testing (make sure he gets hepatitis B screening done)  Will refer you to the gastroenterology team to consider other reasons there may be some liver inflammation going on.  Will repeat your blood work in 3 months and do a virtual follow up if that is convenient for   Will also work on arranging an ultrasound for your right arm.

## 2019-12-29 ENCOUNTER — Encounter: Payer: Self-pay | Admitting: Infectious Diseases

## 2019-12-31 ENCOUNTER — Encounter: Payer: Self-pay | Admitting: Infectious Diseases

## 2019-12-31 DIAGNOSIS — R2231 Localized swelling, mass and lump, right upper limb: Secondary | ICD-10-CM | POA: Insufficient documentation

## 2019-12-31 NOTE — Assessment & Plan Note (Signed)
Tender to the Right anterior AC space with notable firm nodule. Non pulsatile. Does not feel like a valve. ?damage from previous IV site vs thrombophlebitis but the time frame would make that off. Will send for RUE duplex ultrasound.  She will continue to monitor for changes.

## 2019-12-31 NOTE — Assessment & Plan Note (Signed)
She continues to do very well on Biktarvy once a day. VL again undetectable and CD4 961. No changes for her. Will repeat pertinent labs in 6 months and continue to monitor.  No drug interactions identified today.

## 2019-12-31 NOTE — Assessment & Plan Note (Addendum)
Increased VL > 500 copies indicating some break though despite Vemlidy once daily. She has also persistently been hepatitis e Ag (+) and mildly elevated LFTs < 2x ULN.  Last Fibroscan revealed F2-3. Will arrange to repeat this scan to follow for changes. Awaiting new insurance and would like to call for cost assessment.   We discussed referral to GI to consider other unrelated causes for elevated LFTs given her viral load is still relatively low (previously was 188 million.  HBV quant > 500 does put her at increased risk to transmit to partners through blood/sex - her husband will call Health Department for testing to screen for hep b and immunity. Recommend he get vaccines if not previously given or does not have demonstrated immunity.   We also discussed augmenting regimen with adding Entecavir for her - she would like to meet with GI first to identify other causes and repeat hep b DNA again in 3 months. I do not think she is missing Biktarvy doses and explained that she should continue with good adherence given it is treating both.   I do not think she would tolerate interferon therapy given her frequent episodes of anemia.   Return in about 3 months (around 03/29/2020).

## 2019-12-31 NOTE — Assessment & Plan Note (Signed)
FU with GYN in July. Needs repeated Pap at that time.

## 2020-01-05 ENCOUNTER — Other Ambulatory Visit: Payer: Self-pay | Admitting: Infectious Diseases

## 2020-01-05 DIAGNOSIS — R2231 Localized swelling, mass and lump, right upper limb: Secondary | ICD-10-CM

## 2020-01-20 ENCOUNTER — Telehealth: Payer: Self-pay

## 2020-01-20 NOTE — Telephone Encounter (Signed)
COVID-19 Pre-Screening Questions:01/20/20   Do you currently have a fever (>100 F), chills or unexplained body aches?NO  Are you currently experiencing new cough, shortness of breath, sore throat, runny nose? NO .  Have you recently travelled outside the state of [place] in the last 14 days? NO  .  Have you been in contact with someone that is currently pending confirmation of Covid19 testing or has been confirmed to have the [place] virus? NO  **If the patient answers NO to ALL questions -  advise the patient to please call the clinic before coming to the office should any symptoms develop.

## 2020-01-21 ENCOUNTER — Other Ambulatory Visit: Payer: Self-pay

## 2020-01-21 ENCOUNTER — Other Ambulatory Visit: Payer: BC Managed Care – PPO

## 2020-01-21 DIAGNOSIS — B181 Chronic viral hepatitis B without delta-agent: Secondary | ICD-10-CM

## 2020-01-24 LAB — HEPATIC FUNCTION PANEL
AG Ratio: 1.3 (calc) (ref 1.0–2.5)
ALT: 46 U/L — ABNORMAL HIGH (ref 6–29)
AST: 47 U/L — ABNORMAL HIGH (ref 10–30)
Albumin: 4.4 g/dL (ref 3.6–5.1)
Alkaline phosphatase (APISO): 31 U/L (ref 31–125)
Bilirubin, Direct: 0.1 mg/dL (ref 0.0–0.2)
Globulin: 3.5 g/dL (calc) (ref 1.9–3.7)
Indirect Bilirubin: 0.6 mg/dL (calc) (ref 0.2–1.2)
Total Bilirubin: 0.7 mg/dL (ref 0.2–1.2)
Total Protein: 7.9 g/dL (ref 6.1–8.1)

## 2020-01-24 LAB — HEPATITIS B DNA, ULTRAQUANTITATIVE, PCR
Hepatitis B DNA (Calc): 1.04 Log IU/mL — ABNORMAL HIGH
Hepatitis B DNA: 11 IU/mL — ABNORMAL HIGH

## 2020-01-26 ENCOUNTER — Other Ambulatory Visit (HOSPITAL_COMMUNITY)
Admission: RE | Admit: 2020-01-26 | Discharge: 2020-01-26 | Disposition: A | Discharge status: Home / Self Care | Payer: BC Managed Care – PPO | Source: Ambulatory Visit | Attending: Family Medicine | Admitting: Family Medicine

## 2020-01-26 ENCOUNTER — Encounter: Payer: Self-pay | Admitting: Family Medicine

## 2020-01-26 ENCOUNTER — Ambulatory Visit (INDEPENDENT_AMBULATORY_CARE_PROVIDER_SITE_OTHER): Payer: BC Managed Care – PPO | Admitting: Family Medicine

## 2020-01-26 ENCOUNTER — Other Ambulatory Visit: Payer: Self-pay

## 2020-01-26 VITALS — BP 114/82 | HR 76 | Ht 63.0 in | Wt 136.0 lb

## 2020-01-26 DIAGNOSIS — D259 Leiomyoma of uterus, unspecified: Secondary | ICD-10-CM

## 2020-01-26 DIAGNOSIS — N939 Abnormal uterine and vaginal bleeding, unspecified: Secondary | ICD-10-CM | POA: Insufficient documentation

## 2020-01-26 DIAGNOSIS — N979 Female infertility, unspecified: Secondary | ICD-10-CM

## 2020-01-26 DIAGNOSIS — R87611 Atypical squamous cells cannot exclude high grade squamous intraepithelial lesion on cytologic smear of cervix (ASC-H): Secondary | ICD-10-CM | POA: Diagnosis not present

## 2020-01-26 NOTE — Progress Notes (Signed)
   Subjective:    Patient ID: Angelica Hopkins, female    DOB: 10-04-1980, 39 y.o.   MRN: [id number]  HPI Patient seen for irregular menses. In April had menses at the beginning of the month, then another one 14 days later. Periods since then have been 25-28 days apart. Additionally, having difficulty becoming pregnant. Has been trying for several months. Tracks her cycle. Having light bleeding/spotting during sex. No abnormal discharge   Review of Systems     Objective:   Physical Exam Vitals reviewed. Exam conducted with a chaperone present.  Constitutional:      Appearance: Normal appearance.  Cardiovascular:     Rate and Rhythm: Normal rate.     Pulses: Normal pulses.  Abdominal:     Hernia: There is no hernia in the left inguinal area or right inguinal area.  Genitourinary:    Labia:        Right: No rash, tenderness or lesion.        Left: No rash, tenderness or lesion.      Vagina: No signs of injury. No vaginal discharge or tenderness.     Cervix: No cervical motion tenderness, discharge, friability, lesion, erythema or cervical bleeding.     Comments: Left cervical cleft  Lymphadenopathy:     Lower Body: No right inguinal adenopathy. No left inguinal adenopathy.  Skin:    Capillary Refill: Capillary refill takes less than 2 seconds.  Neurological:     Mental Status: She is alert.       Assessment & Plan:  1. Abnormal uterine bleeding (AUB) Had one abnormal cycle. Others have ranged within normal parameters. - Cytology - PAP( [place]) - US PELVIS (TRANSABDOMINAL ONLY); Future  2. Female fertility problem Referral to [place] placed - Ambulatory referral to Endocrinology  3. Uterine leiomyoma, unspecified location We'll check [place] for fibroid. Referral to [facility] for possible myomectomy and fertility. - US PELVIS (TRANSABDOMINAL ONLY); Future  4. Atypical squamous cells cannot exclude high grade squamous intraepithelial lesion on cytologic smear of  cervix (ASC-H) PAP today

## 2020-01-26 NOTE — Progress Notes (Signed)
-=  Coming twice a month After intercourse spotting a bright red blood with out a cycles. Last 3-4 cycles has had pain and bleeding and cramping.   Had abnormal pap 2020 ASCUS with HPV 16 Colposcopy in June of 2020 Needs repeat Papsmear that was never scheduled.

## 2020-01-28 ENCOUNTER — Encounter: Payer: Self-pay | Admitting: Gastroenterology

## 2020-01-28 ENCOUNTER — Other Ambulatory Visit (INDEPENDENT_AMBULATORY_CARE_PROVIDER_SITE_OTHER): Payer: BC Managed Care – PPO

## 2020-01-28 ENCOUNTER — Ambulatory Visit (INDEPENDENT_AMBULATORY_CARE_PROVIDER_SITE_OTHER): Payer: BC Managed Care – PPO | Admitting: Gastroenterology

## 2020-01-28 VITALS — BP 100/62 | HR 84 | Ht 63.0 in | Wt 123.4 lb

## 2020-01-28 DIAGNOSIS — B181 Chronic viral hepatitis B without delta-agent: Secondary | ICD-10-CM

## 2020-01-28 LAB — COMPREHENSIVE METABOLIC PANEL
ALT: 45 U/L — ABNORMAL HIGH (ref 0–35)
AST: 45 U/L — ABNORMAL HIGH (ref 0–37)
Albumin: 4.6 g/dL (ref 3.5–5.2)
Alkaline Phosphatase: 31 U/L — ABNORMAL LOW (ref 39–117)
BUN: 10 mg/dL (ref 6–23)
CO2: 27 mEq/L (ref 19–32)
Calcium: 9.8 mg/dL (ref 8.4–10.5)
Chloride: 104 mEq/L (ref 96–112)
Creatinine, Ser: 1.02 mg/dL (ref 0.40–1.20)
GFR: 73.95 mL/min (ref >60.00)
Glucose, Bld: 90 mg/dL (ref 70–99)
Potassium: 3.9 mEq/L (ref 3.5–5.1)
Sodium: 138 mEq/L (ref 135–145)
Total Bilirubin: 0.7 mg/dL (ref 0.2–1.2)
Total Protein: 8.6 g/dL — ABNORMAL HIGH (ref 6.0–8.3)

## 2020-01-28 LAB — CBC
HCT: 38.1 % (ref 36.0–46.0)
Hemoglobin: 12.7 g/dL (ref 12.0–15.0)
MCHC: 33.3 g/dL (ref 30.0–36.0)
MCV: 89.8 fl (ref 78.0–100.0)
Platelets: 192 10*3/uL (ref 150.0–400.0)
RBC: 4.25 Mil/uL (ref 3.87–5.11)
RDW: 13.5 % (ref 11.5–15.5)
WBC: 3.9 10*3/uL — ABNORMAL LOW (ref 4.0–10.5)

## 2020-01-28 LAB — FERRITIN: Ferritin: 40.6 ng/mL (ref 10.0–291.0)

## 2020-01-28 LAB — IGA: IgA: 193 mg/dL (ref 68–378)

## 2020-01-28 LAB — PROTIME-INR
INR: 1.1 ratio — ABNORMAL HIGH (ref 0.8–1.0)
Prothrombin Time: 12.5 s (ref 9.6–13.1)

## 2020-01-28 NOTE — Patient Instructions (Addendum)
If you are age 37 or older, your body mass index should be between 23-30. Your Body mass index is 21.85 kg/m. If this is out of the aforementioned range listed, please consider follow up with your Primary Care Provider.  If you are age 35 or younger, your body mass index should be between 19-25. Your Body mass index is 21.85 kg/m. If this is out of the aformentioned range listed, please consider follow up with your Primary Care Provider.   Your provider has requested that you go to the basement level for lab work before leaving today. Press "B" on the elevator. The lab is located at the first door on the left as you exit the elevator.  Due to recent changes in healthcare laws, you may see the results of your imaging and laboratory studies on MyChart before your provider has had a chance to review them.  We understand that in some cases there may be results that are confusing or concerning to you. Not all laboratory results come back in the same time frame and the provider may be waiting for multiple results in order to interpret others.  Please give [place] 48 hours in order for your provider to thoroughly review all the results before contacting the office for clarification of your results.   You have been scheduled for an abdominal ultrasound to evaluate liver at [facility] (1st floor of hospital) on 02/11/20 at 9:30am. Please arrive 15 minutes prior to your appointment for registration. Make certain not to have anything to eat or drink after midnight prior to your appointment. Should you need to reschedule your appointment, please contact radiology at [phone]. This test typically takes about 30 minutes to perform.   Thank you for entrusting me with your care and choosing [facility].  Dr Ardis Hughs

## 2020-01-28 NOTE — Progress Notes (Signed)
HPI: This is a very pleasant 39 year old woman who was referred to me by [place] Callas, NP  to evaluate elevated liver tests.    She has been on hepatitis B treatment for about 3 years now.  This is under the direction of infectious disease in [place].  She has been on HIV treatment for about 15 years.  She was sent here by her infectious disease team because her transaminases are still mildly elevated despite good response with her hepatitis B antiviral treatment according to nearly undetectable viral DNA level.  Other than her hepatitis B and HIV she has never heard that she had any other type of liver disease.  She rarely drinks alcohol.  Liver disease does not run in her family.  Her weight is overall stable.  She has no significant abdominal pains, no changes in her bowels or overt GI bleeding.  Prior to starting hepatitis B treatment her AST was 70, her ALT was 52.   Blood work June 2021 hepatitis B DNA 11, nearly undetectable.  AST 47, ALT 46.  CBC was normal  Old Data Reviewed: Blood work 2018 showed hepatitis B positive with DNA level 188 million, hepatitis B surface antigen positive, hepatitis B E antigen positive, hepatitis B surface antibody negative, hepatitis A total antibody nonreactive, hepatitis C antibody nonreactive  HIV positive  Right upper quadrant ultrasound December 2020 was completely normal  Abdominal ultrasound with elastography October 2018 showed "F2 + some F3"   Review of systems: Pertinent positive and negative review of systems were noted in the above HPI section. All other review negative.   Past Medical History:  Diagnosis Date  . Abnormal Pap smear of cervix    LSISL, colposcopy 2012  . Asthma   . Depression   . Hepatitis B infection   . HIV (human immunodeficiency virus infection) ([place])   . 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 Panic disorder   . Vitamin D deficiency     Past Surgical History:  Procedure Laterality Date  . APPENDECTOMY     . CESAREAN SECTION     one previous  . RADIOACTIVE SEED GUIDED EXCISIONAL BREAST BIOPSY Left 03/04/2019   Procedure: RADIOACTIVE SEED GUIDED EXCISIONAL LEFT BREAST BIOPSY AND EXCISION OF LEFT BREAST MASS;  Surgeon: [name], MD;  Location: [facility];  Service: General;  Laterality: Left;    Current Outpatient Medications  Medication Sig Dispense Refill  . albuterol (VENTOLIN HFA) 108 (90 Base) MCG/ACT inhaler Inhale 1-2 puffs into the lungs every 6 (six) hours as needed for wheezing or shortness of breath. 18 g 3  . bictegravir-emtricitabine-tenofovir AF (BIKTARVY) 50-200-25 MG TABS tablet TAKE 1 TABLET BY MOUTH EVERY DAY WITH OR WITHOUT FOOD AT THE SAME TIME EACH DAY 30 tablet 11  . hydrOXYzine (ATARAX/VISTARIL) 25 MG tablet Take 1 tablet (25 mg total) by mouth 3 (three) times daily as needed. 30 tablet 3   No current facility-administered medications for this visit.    Allergies as of 01/28/2020 - Review Complete 01/28/2020  Allergen Reaction Noted  . Hydrocodone-acetaminophen Itching 07/16/2011  . Kiwi extract Other (See Comments) 10/22/2019    Family History  Problem Relation Age of Onset  . Uterine cancer Maternal Aunt   . Lung cancer Maternal Grandmother   . Liver disease Mother   . HIV Mother   . Drug abuse Mother   . HIV Father   . Diabetes Father   . Drug abuse Father   .  Hepatitis B Brother   . Breast cancer Paternal Aunt   . Stomach cancer Paternal Grandmother   . Colon cancer Neg Hx     Social History   Socioeconomic History  . Marital status: Legally Separated    Spouse name: Not on file  . Number of children: 1  . Years of education: Not on file  . Highest education level: Bachelor's degree (e.g., BA, AB, BS)  Occupational History  . Occupation: Pharmacist, hospital  Tobacco Use  . Smoking status: Former Smoker    Types: Cigarettes    Quit date: 10/14/2017    Years since quitting: 2.2  . Smokeless tobacco: Never Used  . Tobacco comment: Quit  3 weeks ago  Vaping Use  . Vaping Use: Former  Substance and Sexual Activity  . Alcohol use: No  . Drug use: Not Currently  . Sexual activity: Yes    Partners: Male    Birth control/protection: None  Other Topics Concern  . Not on file  Social History Narrative  . Not on file   Social Determinants of Health   Financial Resource Strain:   . Difficulty of Paying Living Expenses:   Food Insecurity:   . Worried About Charity fundraiser in the Last Year:   . Arboriculturist in the Last Year:   Transportation Needs:   . Film/video editor (Medical):   Marland Kitchen Lack of Transportation (Non-Medical):   Physical Activity:   . Days of Exercise per Week:   . Minutes of Exercise per Session:   Stress:   . Feeling of Stress :   Social Connections:   . Frequency of Communication with Friends and Family:   . Frequency of Social Gatherings with Friends and Family:   . Attends Religious Services:   . Active Member of Clubs or Organizations:   . Attends Archivist Meetings:   Marland Kitchen Marital Status:   Intimate Partner Violence:   . Fear of Current or Ex-Partner:   . Emotionally Abused:   Marland Kitchen Physically Abused:   . Sexually Abused:      Physical Exam: Ht 5\' 3"  (1.6 m)   Wt 123 lb 6 oz (56 kg)   BMI 21.85 kg/m  Constitutional: generally well-appearing Psychiatric: alert and oriented x3 Eyes: extraocular movements intact Mouth: oral pharynx moist, no lesions Neck: supple no lymphadenopathy Cardiovascular: heart regular rate and rhythm Lungs: clear to auscultation bilaterally Abdomen: soft, nontender, nondistended, no obvious ascites, no peritoneal signs, normal bowel sounds Extremities: no lower extremity edema bilaterally Skin: no lesions on visible extremities   Assessment and plan: 39 y.o. female with coinfection HIV, hepatitis B  She does not have any signs of cirrhosis biochemically or on imaging.  I did explain to her however that with her hepatitis B she will need  hepatoma screening by ultrasound about every 6 months.  Her last ultrasound was December 2020 and it was normal.  I am going to order an up-to-date 1 for her.  She does still have slightly elevated transaminases despite near undetectable hepatitis B viral DNA level.  Perhaps she is just very sensitive to the circulating virus which she does still have.  Perhaps she has HIV irritation of the liver.  Perhaps there is a different cause.  That really is why she was sent here.  We will get a battery of blood test to check for other potential causes of her slightly elevated transaminases.  She will also get up-to-date hepatic profile, CBC and  coags to again check for signs of cirrhosis.  See that list of tests in her AVS.  Please see the "Patient Instructions" section for addition details about the plan.   Owens Loffler, MD [facility] 01/28/2020, 9:41 AM  Cc: [place] Callas, NP  Total time on date of encounter was 45  minutes (this included time spent preparing to see the patient reviewing records; obtaining and/or reviewing separately obtained history; performing a medically appropriate exam and/or evaluation; counseling and educating the patient and family if present; ordering medications, tests or procedures if applicable; and documenting clinical information in the health record).

## 2020-01-31 ENCOUNTER — Ambulatory Visit (HOSPITAL_COMMUNITY)
Admission: RE | Admit: 2020-01-31 | Discharge: 2020-01-31 | Disposition: A | Discharge status: Home / Self Care | Payer: BC Managed Care – PPO | Source: Ambulatory Visit | Attending: Infectious Diseases | Admitting: Infectious Diseases

## 2020-01-31 ENCOUNTER — Other Ambulatory Visit: Payer: Self-pay

## 2020-01-31 DIAGNOSIS — R2231 Localized swelling, mass and lump, right upper limb: Secondary | ICD-10-CM | POA: Diagnosis present

## 2020-01-31 NOTE — Progress Notes (Addendum)
VASCULAR LAB    Right upper extremity venous duplex completed.    Preliminary report:  See CV proc for preliminary results.  Messaged Janene Madeira, NP, with results.  Illya Gienger, RVT 01/31/2020, 9:33 AM

## 2020-02-01 LAB — ANTI-NUCLEAR AB-TITER (ANA TITER): ANA Titer 1: 1:320 {titer} — ABNORMAL HIGH

## 2020-02-01 LAB — ANTI-SMOOTH MUSCLE ANTIBODY, IGG: Actin (Smooth Muscle) Antibody (IGG): <20 U (ref <20)

## 2020-02-01 LAB — ALPHA-1-ANTITRYPSIN: A-1 Antitrypsin, Ser: 167 mg/dL (ref 83–199)

## 2020-02-01 LAB — IRON, TOTAL/TOTAL IRON BINDING CAP
%SAT: 52 % (calc) — ABNORMAL HIGH (ref 16–45)
Iron: 186 ug/dL (ref 40–190)
TIBC: 357 mcg/dL (calc) (ref 250–450)

## 2020-02-01 LAB — CERULOPLASMIN: Ceruloplasmin: 31 mg/dL (ref 18–53)

## 2020-02-01 LAB — ANA: Anti Nuclear Antibody (ANA): POSITIVE — AB

## 2020-02-02 DIAGNOSIS — R2231 Localized swelling, mass and lump, right upper limb: Secondary | ICD-10-CM

## 2020-02-03 ENCOUNTER — Other Ambulatory Visit: Payer: Self-pay

## 2020-02-03 DIAGNOSIS — R768 Other specified abnormal immunological findings in serum: Secondary | ICD-10-CM

## 2020-02-07 LAB — CYTOLOGY - PAP
Chlamydia: NEGATIVE
Comment: NEGATIVE
Comment: NEGATIVE
Comment: NORMAL
High risk HPV: POSITIVE — AB
Neisseria Gonorrhea: NEGATIVE

## 2020-02-08 ENCOUNTER — Encounter: Payer: Self-pay | Admitting: Family Medicine

## 2020-02-11 ENCOUNTER — Ambulatory Visit (HOSPITAL_COMMUNITY)
Admission: RE | Admit: 2020-02-11 | Discharge: 2020-02-11 | Disposition: A | Discharge status: Home / Self Care | Payer: BC Managed Care – PPO | Source: Ambulatory Visit | Attending: Family Medicine | Admitting: Family Medicine

## 2020-02-11 ENCOUNTER — Other Ambulatory Visit: Payer: Self-pay

## 2020-02-11 ENCOUNTER — Other Ambulatory Visit: Payer: BC Managed Care – PPO

## 2020-02-11 ENCOUNTER — Ambulatory Visit (HOSPITAL_COMMUNITY)
Admission: RE | Admit: 2020-02-11 | Discharge: 2020-02-11 | Disposition: A | Discharge status: Home / Self Care | Payer: BC Managed Care – PPO | Source: Ambulatory Visit | Attending: Gastroenterology | Admitting: Gastroenterology

## 2020-02-11 DIAGNOSIS — D259 Leiomyoma of uterus, unspecified: Secondary | ICD-10-CM

## 2020-02-11 DIAGNOSIS — B181 Chronic viral hepatitis B without delta-agent: Secondary | ICD-10-CM | POA: Diagnosis present

## 2020-02-11 DIAGNOSIS — N939 Abnormal uterine and vaginal bleeding, unspecified: Secondary | ICD-10-CM

## 2020-02-15 NOTE — Telephone Encounter (Signed)
I already placed a referral to [place]. Can you check on that for me?  Thanks!

## 2020-02-23 ENCOUNTER — Telehealth: Payer: Self-pay | Admitting: Gastroenterology

## 2020-02-23 NOTE — Telephone Encounter (Signed)
Patient is returning your call from a few days ago in reference to labs

## 2020-02-23 NOTE — Telephone Encounter (Signed)
The pt has been advised to come in for labs.  She will come in on Friday.     "Please call the patient. Her liver is normal on ultrasound. Please remind her about the blood work that we ordered for her recently, SPEP and ACE level. Thanks"

## 2020-02-28 ENCOUNTER — Other Ambulatory Visit: Payer: BC Managed Care – PPO

## 2020-02-28 DIAGNOSIS — R768 Other specified abnormal immunological findings in serum: Secondary | ICD-10-CM

## 2020-03-01 LAB — PROTEIN ELECTROPHORESIS, SERUM
Albumin ELP: 4.2 g/dL (ref 3.8–4.8)
Alpha 1: 0.3 g/dL (ref 0.2–0.3)
Alpha 2: 0.8 g/dL (ref 0.5–0.9)
Beta 2: 0.4 g/dL (ref 0.2–0.5)
Beta Globulin: 0.4 g/dL (ref 0.4–0.6)
Gamma Globulin: 1.9 g/dL — ABNORMAL HIGH (ref 0.8–1.7)
Total Protein: 8 g/dL (ref 6.1–8.1)

## 2020-03-01 LAB — ANGIOTENSIN CONVERTING ENZYME: Angiotensin-Converting Enzyme: 29 U/L (ref 9–67)

## 2020-03-06 ENCOUNTER — Ambulatory Visit: Payer: BC Managed Care – PPO | Admitting: Family Medicine

## 2020-03-20 ENCOUNTER — Ambulatory Visit: Payer: BC Managed Care – PPO | Admitting: Family Medicine

## 2020-03-20 ENCOUNTER — Encounter: Payer: Self-pay | Admitting: General Practice

## 2020-03-28 ENCOUNTER — Telehealth: Payer: Self-pay

## 2020-03-28 NOTE — Telephone Encounter (Signed)
Received call today from [facility] in [place] regarding refill. [place], Pharmacist states patient is transferring medication refills to them. Would like to know if it is okay to dispense medication.  Per last note patient is adherent with medication. Advised pharmacy it is okay to dispense medication. [place]

## 2020-04-18 ENCOUNTER — Telehealth: Payer: Self-pay | Admitting: Infectious Diseases

## 2020-04-18 ENCOUNTER — Other Ambulatory Visit: Payer: Self-pay

## 2020-04-18 ENCOUNTER — Ambulatory Visit: Payer: BC Managed Care – PPO

## 2020-04-18 DIAGNOSIS — F418 Other specified anxiety disorders: Secondary | ICD-10-CM

## 2020-04-18 MED ORDER — HYDROXYZINE HCL 50 MG PO TABS: 50.0000 mg | ORAL_TABLET | Freq: Three times a day (TID) | ORAL | 1 refills | Status: DC | PRN

## 2020-04-18 NOTE — Telephone Encounter (Signed)
In speaking with Marcie Bal our counselor, Lorriane Shire had requested to increase her Vistaril to 50 mg as needed.  She takes a 25 mg 1-2 times a week and has moderate effect. Will write for 50 mg as needed and send a new prescription.

## 2020-04-25 ENCOUNTER — Ambulatory Visit: Payer: BC Managed Care – PPO

## 2020-04-25 ENCOUNTER — Other Ambulatory Visit: Payer: Self-pay

## 2020-04-25 ENCOUNTER — Ambulatory Visit (INDEPENDENT_AMBULATORY_CARE_PROVIDER_SITE_OTHER): Payer: BC Managed Care – PPO | Admitting: Infectious Diseases

## 2020-04-25 ENCOUNTER — Encounter: Payer: Self-pay | Admitting: Infectious Diseases

## 2020-04-25 VITALS — BP 107/75 | HR 78 | Temp 98.8°F | Ht 63.0 in | Wt 119.0 lb

## 2020-04-25 DIAGNOSIS — B181 Chronic viral hepatitis B without delta-agent: Secondary | ICD-10-CM | POA: Diagnosis not present

## 2020-04-25 DIAGNOSIS — B20 Human immunodeficiency virus [HIV] disease: Secondary | ICD-10-CM

## 2020-04-25 DIAGNOSIS — R2231 Localized swelling, mass and lump, right upper limb: Secondary | ICD-10-CM

## 2020-04-25 DIAGNOSIS — Z21 Asymptomatic human immunodeficiency virus [HIV] infection status: Secondary | ICD-10-CM

## 2020-04-25 NOTE — Patient Instructions (Addendum)
Protein shakes, try to get 100 grams of protein in the course of a day.   Continue your Biktarvy every day.   Stop by the lab on your way out to update your blood work.   See you again in 4 months!

## 2020-04-25 NOTE — Progress Notes (Signed)
Name: Angelica Hopkins  DOB: 01-12-1981  MRN: [id number] PCP: Sloan Leiter, MD      Patient Active Problem List   Diagnosis Date Noted  . Human immunodeficiency virus (HIV) disease ([place]) 11/11/2008    Priority: High  . Nodule of skin of right upper extremity 12/31/2019  . LGSIL on Pap smear of cervix 01/15/2019  . Migraines 07/18/2017  . Hepatitis B infection 11/15/2015  . Anxiety associated with depression 12/14/2014  . Neuropathy 05/28/2011  . Excessive and frequent menstruation 10/27/2009    SUBJECTIVE:  Brief Narrative:  Angelica Hopkins is a 39 y.o. AA female with HIV Dx 2005. Deferred treatment with ART x 5 years.  HIV Risk: heterosexual, parents HIV+ (was not perinatally infected) History of OIs: none.   Previous Regimens:   Genvoya 2010 >> suppressed, inconsistent use   Biktarvy 2018 >> suppressed    Genotype:   04/2017 - sensitive   CC:  HIV follow up care Hep B follow up care     HPI:  Angelica Hopkins has been doing well. 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 Has noticed some weight loss which is typical for her this time of year when she resumes to many activities. She knows she probably needs to eat a little more during these times but goes long periods of time without having much to eat.  Continues to take her Biktarvy every day. In review of previous hepatitis B labs she tells me today that there was probably a time where she was not taking her medicine as consistently and thinks that that might have been why her hepatitis B viral load had increased above 500. She is back on it very regularly now it is much more attentive at this.  She is working with her counselor Marcie Bal for stress management. Really values her in her life. Has some upcoming appointments with dermatology but they are still over a month away. No change to the nodular area on the right inner forearm. Also has a new patient visit with rheumatology to evaluate a  significantly elevated ANA lab test.   Review of Systems  All other systems reviewed and are negative.   Past Medical History:  Diagnosis Date  . Abnormal Pap smear of cervix    LSISL, colposcopy 2012  . Asthma   . Depression   . Hepatitis B infection   . HIV (human immunodeficiency virus infection) ([place])   . 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 Panic disorder   . Vitamin D deficiency     Social History   Tobacco Use  . Smoking status: Former Smoker    Types: Cigarettes    Quit date: 10/14/2017    Years since quitting: 2.5  . Smokeless tobacco: Never Used  . Tobacco comment: Quit 3 weeks ago  Vaping Use  . Vaping Use: Former  Substance Use Topics  . Alcohol use: Yes    Comment: occ  . Drug use: Not Currently   Outpatient Medications Prior to Visit  Medication Sig Dispense Refill  . albuterol (VENTOLIN HFA) 108 (90 Base) MCG/ACT inhaler Inhale 1-2 puffs into the lungs every 6 (six) hours as needed for wheezing or shortness of breath. 18 g 3  . bictegravir-emtricitabine-tenofovir AF (BIKTARVY) 50-200-25 MG TABS tablet TAKE 1 TABLET BY MOUTH EVERY DAY WITH OR WITHOUT FOOD AT THE SAME TIME EACH DAY 30 tablet 11  . hydrOXYzine (ATARAX/VISTARIL) 50 MG tablet Take 1 tablet (50 mg total) by mouth  3 (three) times daily as needed for anxiety. 90 tablet 1   No facility-administered medications prior to visit.   Allergies  Allergen Reactions  . Hydrocodone-Acetaminophen Itching    Patient reported  . Kiwi Extract Other (See Comments)    Numbing    Social History   Socioeconomic History  . Marital status: Legally Separated    Spouse name: Not on file  . Number of children: 1  . Years of education: Not on file  . Highest education level: Bachelor's degree (e.g., BA, AB, BS)  Occupational History  . Occupation: Pharmacist, hospital  Tobacco Use  . Smoking status: Former Smoker    Types: Cigarettes    Quit date: 10/14/2017    Years since quitting: 2.5  . Smokeless tobacco: Never Used   . Tobacco comment: Quit 3 weeks ago  Vaping Use  . Vaping Use: Former  Substance and Sexual Activity  . Alcohol use: Yes    Comment: occ  . Drug use: Not Currently  . Sexual activity: Yes    Partners: Male    Birth control/protection: None  Other Topics Concern  . Not on file  Social History Narrative  . Not on file   Social Determinants of Health   Financial Resource Strain:   . Difficulty of Paying Living Expenses: Not on file  Food Insecurity:   . Worried About Charity fundraiser in the Last Year: Not on file  . Ran Out of Food in the Last Year: Not on file  Transportation Needs:   . Lack of Transportation (Medical): Not on file  . Lack of Transportation (Non-Medical): Not on file  Physical Activity:   . Days of Exercise per Week: Not on file  . Minutes of Exercise per Session: Not on file  Stress:   . Feeling of Stress : Not on file  Social Connections:   . Frequency of Communication with Friends and Family: Not on file  . Frequency of Social Gatherings with Friends and Family: Not on file  . Attends Religious Services: Not on file  . Active Member of Clubs or Organizations: Not on file  . Attends Archivist Meetings: Not on file  . Marital Status: Not on file     OBJECTIVE: Vitals:   04/25/20 1550  BP: 107/75  Pulse: 78  Temp: 98.8 F (37.1 C)  TempSrc: Oral  SpO2: 99%  Weight: 119 lb (54 kg)  Height: 5\' 3"  (1.6 m)   Body mass index is 21.08 kg/m.   Physical Exam Vitals reviewed.  HENT:     Mouth/Throat:     Mouth: No oral lesions.     Dentition: No dental abscesses.  Eyes:     Pupils: Pupils are equal, round, and reactive to light.  Cardiovascular:     Rate and Rhythm: Normal rate and regular rhythm.     Heart sounds: Normal heart sounds. No murmur heard.   Pulmonary:     Effort: Pulmonary effort is normal. No respiratory distress.     Breath sounds: Normal breath sounds.  Abdominal:     General: There is no distension.      Palpations: Abdomen is soft.     Tenderness: There is no abdominal tenderness.  Musculoskeletal:        General: No tenderness. Normal range of motion.  Lymphadenopathy:     Cervical: No cervical adenopathy.  Skin:    General: Skin is warm and dry.     Findings: No rash.  Neurological:  Mental Status: She is alert and oriented to person, place, and time.  Psychiatric:        Mood and Affect: Mood and affect normal.        Judgment: Judgment normal.     Comments: In good spirits.     HIV 1 RNA Quant (copies/mL)  Date Value  12/13/2019 <20 NOT DETECTED  06/15/2019 <20 NOT DETECTED  01/21/2019 <20 NOT DETECTED   CD4 T Cell Abs (/uL)  Date Value  12/13/2019 961  06/15/2019 927  01/21/2019 701   Lab Results  Component Value Date   HAV NON-REACTIVE 05/12/2017   HBEAG REACTIVE (A) 12/13/2019   HEPCAB NON-REACTIVE 05/12/2017   Lab Results  Component Value Date   WBC 3.9 (L) 01/28/2020   HGB 12.7 01/28/2020   HCT 38.1 01/28/2020   MCV 89.8 01/28/2020   PLT 192.0 01/28/2020   Lab Results  Component Value Date   CREATININE 1.02 01/28/2020   CREATININE 0.95 06/15/2019   CREATININE 1.02 08/13/2018   Lab Results  Component Value Date   ALT 45 (H) 01/28/2020   AST 45 (H) 01/28/2020   GGT 16 06/23/2017   ALKPHOS 31 (L) 01/28/2020   BILITOT 0.7 01/28/2020    Problem List Items Addressed This Visit      High   Human immunodeficiency virus (HIV) disease ([place])    Under excellent control on Biktarvy once daily. No concern for drug interaction or access to medications. She will continue this therapy for dual treatment of HIV and hepatitis B. Offered flu vaccine today however she politely declined. She is up-to-date on Covid vaccination. Pap smear is up-to-date. She will return for follow-up in 4 months        Unprioritized   Nodule of skin of right upper extremity    No changes to the nodule overlying previous scar tissue in the right antecubital fossa. No other  new spots of come up. With positive ANA uncertain if these are linked. Has an appointment with dermatology for evaluation and possible biopsy of the area. 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      Hepatitis B infection    Viral load at last check was back down to less than 20 copies. Likely her description of imperfect medication consistency correlated with increased viral load. We discussed that she is very sensitive to stopping her tenofovir and I would suggest she not miss any doses given E antigen positive status. No need to augment regimen. She did have another ultrasound of the liver in July of this year that revealed no changes and no concern for cirrhotic features or mass-effect. Would continue the screenings at least annually until 42 then given coinfection with HIV would probably increase to twice a year for surveillance.      Relevant Orders   HIV-1 RNA quant-no reflex-bld   Hepatitis B DNA, ultraquantitative, PCR    Other Visit Diagnoses    Asymptomatic HIV infection ([place])    -  Primary   Relevant Orders   HIV-1 RNA quant-no reflex-bld   Hepatitis B DNA, ultraquantitative, PCR     Janene Madeira, MSN, NP-C [facility] [email or web address] Pager: [phone] Office: [phone] [place]: [phone]

## 2020-04-26 ENCOUNTER — Encounter: Payer: Self-pay | Admitting: Infectious Diseases

## 2020-04-27 NOTE — Assessment & Plan Note (Addendum)
Under excellent control on Biktarvy once daily. No concern for drug interaction or access to medications. She will continue this therapy for dual treatment of HIV and hepatitis B. Offered flu vaccine today however she politely declined. She is up-to-date on Covid vaccination. Pap smear is up-to-date. She will return for follow-up in 4 months

## 2020-04-27 NOTE — Assessment & Plan Note (Signed)
Viral load at last check was back down to less than 20 copies. Likely her description of imperfect medication consistency correlated with increased viral load. We discussed that she is very sensitive to stopping her tenofovir and I would suggest she not miss any doses given E antigen positive status. No need to augment regimen. She did have another ultrasound of the liver in July of this year that revealed no changes and no concern for cirrhotic features or mass-effect. Would continue the screenings at least annually until 42 then given coinfection with HIV would probably increase to twice a year for surveillance.

## 2020-04-27 NOTE — Assessment & Plan Note (Signed)
No changes to the nodule overlying previous scar tissue in the right antecubital fossa. No other new spots of come up. With positive ANA uncertain if these are linked. Has an appointment with dermatology for evaluation and possible biopsy of the area. 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

## 2020-04-28 LAB — HIV-1 RNA QUANT-NO REFLEX-BLD
HIV 1 RNA Quant: <20 Copies/mL
HIV-1 RNA Quant, Log: <1.3 Log cps/mL

## 2020-04-28 LAB — HEPATITIS B DNA, ULTRAQUANTITATIVE, PCR
Hepatitis B DNA (Calc): <1 Log IU/mL — ABNORMAL HIGH
Hepatitis B DNA: <10 IU/mL — ABNORMAL HIGH

## 2020-05-02 ENCOUNTER — Other Ambulatory Visit: Payer: Self-pay

## 2020-05-02 ENCOUNTER — Encounter: Payer: Self-pay | Admitting: *Deleted

## 2020-05-02 ENCOUNTER — Ambulatory Visit: Payer: BC Managed Care – PPO

## 2020-06-17 ENCOUNTER — Other Ambulatory Visit: Payer: Self-pay | Admitting: Infectious Diseases

## 2020-06-17 DIAGNOSIS — B20 Human immunodeficiency virus [HIV] disease: Secondary | ICD-10-CM

## 2020-07-03 NOTE — Progress Notes (Deleted)
Office Visit Note  Patient: Angelica Hopkins             Date of Birth: 10/08/1980           MRN: [id number]             PCP: Sloan Leiter, MD Referring: Milus Banister, MD Visit Date: 07/17/2020 Occupation: @GUAROCC @  Subjective:  No chief complaint on file.   History of Present Illness: Angelica Hopkins is a 39 y.o. female ***   Activities of Daily Living:  Patient reports morning stiffness for all  Day.  Patient Denies nocturnal pain.  Difficulty dressing/grooming: Denies Difficulty climbing stairs: Denies Difficulty getting out of chair: Denies Difficulty using hands for taps, buttons, cutlery, and/or writing: Denies  Review of Systems  Constitutional: Positive for fatigue.  HENT: Negative for mouth sores, mouth dryness and nose dryness.   Eyes: Negative for pain, itching and dryness.  Respiratory: Negative for shortness of breath and difficulty breathing.   Cardiovascular: Negative for chest pain and palpitations.  Gastrointestinal: Negative for blood in stool, constipation and diarrhea.  Endocrine: Negative for increased urination.  Genitourinary: Negative for difficulty urinating.  Musculoskeletal: Positive for myalgias, morning stiffness, muscle tenderness and myalgias. Negative for arthralgias, joint pain and joint swelling.  Skin: Negative for color change, rash and redness.  Allergic/Immunologic: Negative for susceptible to infections.  Neurological: Positive for headaches. Negative for dizziness, numbness, memory loss and weakness.  Hematological: Negative for bruising/bleeding tendency.  Psychiatric/Behavioral: Negative for confusion.    PMFS History:  Patient Active Problem List   Diagnosis Date Noted  . Nodule of skin of right upper extremity 12/31/2019  . LGSIL on Pap smear of cervix 01/15/2019  . Migraines 07/18/2017  . Hepatitis B infection 11/15/2015  . Anxiety associated with depression 12/14/2014  . Neuropathy 05/28/2011  . Excessive and  frequent menstruation 10/27/2009  . Human immunodeficiency virus (HIV) disease ([place]) 11/11/2008    Past Medical History:  Diagnosis Date  . Abnormal Pap smear of cervix    LSISL, colposcopy 2012  . Asthma   . Depression   . Hepatitis B infection   . HIV (human immunodeficiency virus infection) ([place])   . Migraines    per patient   . 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 Panic disorder   . Vitamin D deficiency     Family History  Problem Relation Age of Onset  . Uterine cancer Maternal Aunt   . Lung cancer Maternal Grandmother   . Liver disease Mother   . HIV Mother   . Drug abuse Mother   . HIV Father   . Diabetes Father   . Drug abuse Father   . Healthy Brother   . Breast cancer Paternal Aunt   . Stomach cancer Paternal Grandmother   . Healthy Sister   . Healthy Brother   . Healthy Sister   . Healthy Daughter   . Colon cancer Neg Hx    Past Surgical History:  Procedure Laterality Date  . APPENDECTOMY    . CESAREAN SECTION     one previous  . RADIOACTIVE SEED GUIDED EXCISIONAL BREAST BIOPSY Left 03/04/2019   Procedure: RADIOACTIVE SEED GUIDED EXCISIONAL LEFT BREAST BIOPSY AND EXCISION OF LEFT BREAST MASS;  Surgeon: [name], MD;  Location: [facility];  Service: General;  Laterality: Left;   Social History   Social History Narrative  . Not on file   Immunization History  Administered Date(s) Administered  . Hepatitis  A, Adult 11/11/2008  . Influenza-Unspecified 05/15/2017, 07/17/2018, 03/08/2019  . PFIZER SARS-COV-2 Vaccination 10/22/2019, 11/22/2019, 07/07/2020  . PPD Test 09/24/2019     Objective: Vital Signs: BP 111/74 (BP Location: Left Arm, Patient Position: Sitting, Cuff Size: Normal)   Pulse 62   Resp 13   Ht 5' 3.75" (1.619 m)   Wt 127 lb 12.8 oz (58 kg)   BMI 22.11 kg/m    Physical Exam   Musculoskeletal Exam: ***  CDAI Exam: CDAI Score: -- Patient Global: --; Provider Global: -- Swollen: --; Tender: -- Joint Exam  07/17/2020   No joint exam has been documented for this visit   There is currently no information documented on the homunculus. Go to the Rheumatology activity and complete the homunculus joint exam.  Investigation: No additional findings.  Imaging: No results found.  Recent Labs: Lab Results  Component Value Date   WBC 3.9 (L) 01/28/2020   HGB 12.7 01/28/2020   PLT 192.0 01/28/2020   NA 138 01/28/2020   K 3.9 01/28/2020   CL 104 01/28/2020   CO2 27 01/28/2020   GLUCOSE 90 01/28/2020   BUN 10 01/28/2020   CREATININE 1.02 01/28/2020   BILITOT 0.7 01/28/2020   ALKPHOS 31 (L) 01/28/2020   AST 45 (H) 01/28/2020   ALT 45 (H) 01/28/2020   PROT 8.0 02/28/2020   ALBUMIN 4.6 01/28/2020   CALCIUM 9.8 01/28/2020   GFRAA 90 06/15/2019   QFTBGOLD NEGATIVE 05/12/2017    Speciality Comments: No specialty comments available.  Procedures:  No procedures performed Allergies: Hydrocodone-acetaminophen and Kiwi extract   Assessment / Plan:     Visit Diagnoses: Positive ANA (antinuclear antibody) - 01/28/20: ANA 1:320 NS, Actin ab<20, A-1 antitrypsin 167, Ceruplasmin 31, IgA 133  Nodule of skin of right upper extremity  Human immunodeficiency virus (HIV) disease ([place]) - dxd 2007, fu by Dr. Doren Custard.  Chronic viral hepatitis B without delta agent and without coma (H[facility])  Hx of migraines  LGSIL on Pap smear of cervix  Excessive and frequent menstruation  Former smoker - 5 cigs a day x 20 years. Quit smoking 2020  Orders: No orders of the defined types were placed in this encounter.  No orders of the defined types were placed in this encounter.   Face-to-face time spent with patient was *** minutes. Greater than 50% of time was spent in counseling and coordination of care.  Follow-Up Instructions: No follow-ups on file.   Bo Merino, MD  Note - This record has been created using Editor, commissioning.  Chart creation errors have been sought, but may not always  have been  located. Such creation errors do not reflect on  the standard of medical care.

## 2020-07-17 ENCOUNTER — Encounter: Payer: Self-pay | Admitting: Rheumatology

## 2020-07-17 ENCOUNTER — Other Ambulatory Visit: Payer: Self-pay

## 2020-07-17 ENCOUNTER — Encounter: Payer: BC Managed Care – PPO | Admitting: Rheumatology

## 2020-07-17 DIAGNOSIS — R87612 Low grade squamous intraepithelial lesion on cytologic smear of cervix (LGSIL): Secondary | ICD-10-CM

## 2020-07-17 DIAGNOSIS — R768 Other specified abnormal immunological findings in serum: Secondary | ICD-10-CM

## 2020-07-17 DIAGNOSIS — R2231 Localized swelling, mass and lump, right upper limb: Secondary | ICD-10-CM

## 2020-07-17 DIAGNOSIS — B20 Human immunodeficiency virus [HIV] disease: Secondary | ICD-10-CM

## 2020-07-17 DIAGNOSIS — B181 Chronic viral hepatitis B without delta-agent: Secondary | ICD-10-CM

## 2020-07-17 DIAGNOSIS — G629 Polyneuropathy, unspecified: Secondary | ICD-10-CM

## 2020-07-17 DIAGNOSIS — Z8669 Personal history of other diseases of the nervous system and sense organs: Secondary | ICD-10-CM

## 2020-07-17 DIAGNOSIS — N920 Excessive and frequent menstruation with regular cycle: Secondary | ICD-10-CM

## 2020-07-18 NOTE — Progress Notes (Signed)
This encounter was created in error - please disregard.

## 2020-08-07 ENCOUNTER — Ambulatory Visit: Payer: BC Managed Care – PPO | Admitting: Dermatology

## 2020-08-25 ENCOUNTER — Telehealth: Payer: Self-pay

## 2020-08-25 NOTE — Telephone Encounter (Signed)
Pharmacy calling to say patient will be transitioning to new pharmacy [facility] after last refill was given today.  Patient will need a new script sent to pharmacy.   Biktarvy script sent electronically with 2 refills to [place], RN

## 2020-10-15 ENCOUNTER — Encounter (HOSPITAL_COMMUNITY): Payer: Self-pay

## 2020-10-15 ENCOUNTER — Ambulatory Visit (HOSPITAL_COMMUNITY)
Admission: EM | Admit: 2020-10-15 | Discharge: 2020-10-15 | Disposition: A | Discharge status: Home / Self Care | Payer: BC Managed Care – PPO | Attending: Medical Oncology | Admitting: Medical Oncology

## 2020-10-15 DIAGNOSIS — J45909 Unspecified asthma, uncomplicated: Secondary | ICD-10-CM | POA: Diagnosis not present

## 2020-10-15 DIAGNOSIS — Z21 Asymptomatic human immunodeficiency virus [HIV] infection status: Secondary | ICD-10-CM | POA: Diagnosis not present

## 2020-10-15 DIAGNOSIS — Z87891 Personal history of nicotine dependence: Secondary | ICD-10-CM | POA: Insufficient documentation

## 2020-10-15 DIAGNOSIS — R059 Cough, unspecified: Secondary | ICD-10-CM | POA: Insufficient documentation

## 2020-10-15 DIAGNOSIS — R0981 Nasal congestion: Secondary | ICD-10-CM | POA: Diagnosis not present

## 2020-10-15 DIAGNOSIS — Z20822 Contact with and (suspected) exposure to covid-19: Secondary | ICD-10-CM | POA: Diagnosis not present

## 2020-10-15 DIAGNOSIS — R519 Headache, unspecified: Secondary | ICD-10-CM

## 2020-10-15 LAB — POC INFLUENZA A AND B ANTIGEN (URGENT CARE ONLY)
Influenza A Ag: NEGATIVE
Influenza B Ag: NEGATIVE

## 2020-10-15 LAB — SARS CORONAVIRUS 2 (TAT 6-24 HRS): SARS Coronavirus 2: NEGATIVE

## 2020-10-15 MED ORDER — BENZONATATE 100 MG PO CAPS
100.0000 mg | ORAL_CAPSULE | Freq: Three times a day (TID) | ORAL | 0 refills | Status: DC
Start: 2020-10-15 — End: 2020-11-30

## 2020-10-15 MED ORDER — FLUTICASONE PROPIONATE 50 MCG/ACT NA SUSP
2.0000 | Freq: Every day | NASAL | 0 refills | Status: DC
Start: 2020-10-15 — End: 2021-07-11

## 2020-10-15 NOTE — ED Provider Notes (Signed)
[facility]    CSN: [id number] Arrival date & time: 10/15/20  1646     History   Chief Complaint Chief Complaint  Patient presents with  . Facial Pain    HPI Angelica Hopkins is a 40 y.o. female.   HPI   Facial Pain: Pt stated that starting on Thursday she has had facial pain and congestion, left ear pain, cough, body aches, fatigue. Symptoms have continued and have not improved. She has tried OTC sinus medication without improvement. She suspects that she had a fever yesterday but this was unrecorded. No nausea, vomiting, significant sore throat.   Past Medical History:  Diagnosis Date  . Abnormal Pap smear of cervix    LSISL, colposcopy 2012  . Asthma   . Depression   . Hepatitis B infection   . HIV (human immunodeficiency virus infection) ([place])   . Migraines    per patient   . 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 Panic disorder   . Vitamin D deficiency     Patient Active Problem List   Diagnosis Date Noted  . Nodule of skin of right upper extremity 12/31/2019  . LGSIL on Pap smear of cervix 01/15/2019  . Migraines 07/18/2017  . Hepatitis B infection 11/15/2015  . Anxiety associated with depression 12/14/2014  . Neuropathy 05/28/2011  . Excessive and frequent menstruation 10/27/2009  . Human immunodeficiency virus (HIV) disease ([place]) 11/11/2008    Past Surgical History:  Procedure Laterality Date  . APPENDECTOMY    . CESAREAN SECTION     one previous  . RADIOACTIVE SEED GUIDED EXCISIONAL BREAST BIOPSY Left 03/04/2019   Procedure: RADIOACTIVE SEED GUIDED EXCISIONAL LEFT BREAST BIOPSY AND EXCISION OF LEFT BREAST MASS;  Surgeon: [name], MD;  Location: [facility];  Service: General;  Laterality: Left;    OB History    Gravida  2   Para      Term      Preterm      AB  1   Living  1     SAB  1   IAB      Ectopic      Multiple      Live Births  1            Home Medications    Prior to Admission  medications   Medication Sig Start Date End Date Taking? Authorizing Provider  albuterol (VENTOLIN HFA) 108 (90 Base) MCG/ACT inhaler Inhale 1-2 puffs into the lungs every 6 (six) hours as needed for wheezing or shortness of breath. 06/15/19   [place] Callas, NP  bictegravir-emtricitabine-tenofovir AF (BIKTARVY) 50-200-25 MG TABS tablet TAKE 1 TABLET BY MOUTH DAILY WITH OR WITHOUT FOOD AT THE SAME TIME EACH DAY 06/19/20   [place] Callas, NP  hydrOXYzine (ATARAX/VISTARIL) 50 MG tablet Take 1 tablet (50 mg total) by mouth 3 (three) times daily as needed for anxiety. 04/18/20   Al[place] Callas, NP    Family History Family History  Problem Relation Age of Onset  . Uterine cancer Maternal Aunt   . Lung cancer Maternal Grandmother   . Liver disease Mother   . HIV Mother   . Drug abuse Mother   . HIV Father   . Diabetes Father   . Drug abuse Father   . Healthy Brother   . Breast cancer Paternal Aunt   . Stomach cancer Paternal Grandmother   . Healthy Sister   . Healthy Brother   .  Healthy Sister   . Healthy Daughter   . Colon cancer Neg Hx     Social History Social History   Tobacco Use  . Smoking status: Former Smoker    Types: Cigarettes    Quit date: 10/14/2017    Years since quitting: 3.0  . Smokeless tobacco: Never Used  . Tobacco comment: Quit 3 weeks ago  Vaping Use  . Vaping Use: Former  Substance Use Topics  . Alcohol use: Yes    Comment: occ  . Drug use: Not Currently     Allergies   Hydrocodone-acetaminophen and Kiwi extract   Review of Systems Review of Systems  As stated above in HPI Physical Exam Triage Vital Signs ED Triage Vitals  Enc Vitals Group     BP 10/15/20 1712 (!) 116/56     Pulse Rate 10/15/20 1712 93     Resp 10/15/20 1712 16     Temp 10/15/20 1712 98.3 F (36.8 C)     Temp Source 10/15/20 1712 Temporal     SpO2 10/15/20 1712 100 %     Weight --      Height --      Head Circumference --      Peak Flow --      Pain Score  10/15/20 1713 6     Pain Loc --      Pain Edu? --      Excl. in [place]? --    No data found.  Updated Vital Signs BP (!) 116/56 (BP Location: Right Arm)   Pulse 93   Temp 98.3 F (36.8 C) (Temporal)   Resp 16   SpO2 100%   Physical Exam Vitals and nursing note reviewed.  Constitutional:      General: She is not in acute distress.    Appearance: Normal appearance. She is not ill-appearing, toxic-appearing or diaphoretic.  HENT:     Head: Normocephalic and atraumatic.     Right Ear: Tympanic membrane, ear canal and external ear normal.     Left Ear: Tympanic membrane, ear canal and external ear normal.     Nose: Rhinorrhea present.     Mouth/Throat:     Mouth: Mucous membranes are moist.     Pharynx: Posterior oropharyngeal erythema (mild) present. No oropharyngeal exudate.  Eyes:     Extraocular Movements: Extraocular movements intact.     Pupils: Pupils are equal, round, and reactive to light.  Cardiovascular:     Rate and Rhythm: Normal rate and regular rhythm.     Heart sounds: Normal heart sounds.  Pulmonary:     Effort: Pulmonary effort is normal.     Breath sounds: Normal breath sounds.  Musculoskeletal:     Cervical back: Normal range of motion and neck supple.  Lymphadenopathy:     Cervical: No cervical adenopathy.  Skin:    General: Skin is warm.  Neurological:     Mental Status: She is alert and oriented to person, place, and time.  Psychiatric:        Behavior: Behavior normal.      UC Treatments / Results  Labs (all labs ordered are listed, but only abnormal results are displayed) Labs Reviewed - No data to display  EKG   Radiology No results found.  Procedures Procedures (including critical care time)  Medications Ordered in UC Medications - No data to display  Initial Impression / Assessment and Plan / UC Course  I have reviewed the triage vital signs and the nursing  notes.  Pertinent labs & imaging results that were available during my  care of the patient were reviewed by me and considered in my medical decision making (see chart for details).     New. Likely viral. Treating with tessalon, flonase, rest, fluids. COVID-19 and influenza testing pending. Discussed red flag signs and symptoms.  Final Clinical Impressions(s) / UC Diagnoses   Final diagnoses:  None   Discharge Instructions   None    ED Prescriptions    None     PDMP not reviewed this encounter.   Hughie Closs, [place] 10/15/20 1739

## 2020-10-15 NOTE — ED Triage Notes (Signed)
Pt present facial and left ear pain. Pt states that symptoms started on Thursday with her chest been heavy due to history of asthma.

## 2020-10-19 ENCOUNTER — Other Ambulatory Visit: Payer: Self-pay

## 2020-10-19 DIAGNOSIS — B20 Human immunodeficiency virus [HIV] disease: Secondary | ICD-10-CM

## 2020-10-19 MED ORDER — BIKTARVY 50-200-25 MG PO TABS: ORAL_TABLET | ORAL | 0 refills | Status: DC

## 2020-11-09 ENCOUNTER — Other Ambulatory Visit: Payer: Self-pay | Admitting: Infectious Diseases

## 2020-11-09 DIAGNOSIS — B20 Human immunodeficiency virus [HIV] disease: Secondary | ICD-10-CM

## 2020-11-22 ENCOUNTER — Ambulatory Visit: Payer: Medicaid Other | Admitting: Physician Assistant

## 2020-11-30 ENCOUNTER — Other Ambulatory Visit: Payer: Self-pay

## 2020-11-30 ENCOUNTER — Encounter: Payer: Self-pay | Admitting: Infectious Diseases

## 2020-11-30 ENCOUNTER — Ambulatory Visit (INDEPENDENT_AMBULATORY_CARE_PROVIDER_SITE_OTHER): Payer: BC Managed Care – PPO | Admitting: Infectious Diseases

## 2020-11-30 VITALS — BP 127/82 | HR 77 | Resp 16 | Ht 63.25 in | Wt 127.0 lb

## 2020-11-30 DIAGNOSIS — F32A Depression, unspecified: Secondary | ICD-10-CM | POA: Diagnosis not present

## 2020-11-30 DIAGNOSIS — Z21 Asymptomatic human immunodeficiency virus [HIV] infection status: Secondary | ICD-10-CM

## 2020-11-30 DIAGNOSIS — B20 Human immunodeficiency virus [HIV] disease: Secondary | ICD-10-CM | POA: Diagnosis not present

## 2020-11-30 DIAGNOSIS — B181 Chronic viral hepatitis B without delta-agent: Secondary | ICD-10-CM

## 2020-11-30 MED ORDER — BIKTARVY 50-200-25 MG PO TABS: ORAL_TABLET | ORAL | 11 refills | Status: DC

## 2020-11-30 NOTE — Progress Notes (Signed)
Name: Angelica Hopkins  DOB: 07-16-1981  MRN: [id number] PCP: Sloan Leiter, MD      SUBJECTIVE:  Brief Narrative:  Angelica Hopkins is a 40 y.o. female with well controlled HIV, Dx 2005. Deferred treatment with ART x 5 years.  HIV Risk: heterosexual, parents HIV+ (was not perinatally infected) History of OIs: none.   Previous Regimens:   Genvoya 2010 >> suppressed, inconsistent use   Biktarvy 2018 >> suppressed    Genotype:   04/2017 - sensitive   CC:  HIV follow up care Hep B follow up care     HPI:  Angelica Hopkins has been doing well. She is very excited she is nearly completed with her master's degree and looking forward to being done with coursework.   Continues to take her Biktarvy every day without missed doses. No concern for side effect or difficult accessing medication.   She has given thought to resuming some counseling sessions.  Working with headache/migraine clinic.     Review of Systems  Constitutional: Negative for chills, fever, malaise/fatigue and weight loss.  HENT: Negative for sore throat.        No dental problems  Respiratory: Negative for cough and sputum production.   Cardiovascular: Negative for chest pain and leg swelling.  Gastrointestinal: Negative for abdominal pain, diarrhea and vomiting.  Genitourinary: Negative for dysuria and flank pain.  Musculoskeletal: Negative for joint pain, myalgias and neck pain.  Skin: Negative for rash.  Neurological: Positive for headaches. Negative for dizziness and tingling.  Psychiatric/Behavioral: Negative for depression and substance abuse. The patient is nervous/anxious. The patient does not have insomnia.     Past Medical History:  Diagnosis Date  . Abnormal Pap smear of cervix    LSISL, colposcopy 2012  . Asthma   . Depression   . Hepatitis B infection   . HIV (human immunodeficiency virus infection) ([place])   . Migraines    per patient   . 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 Panic disorder    . Vitamin D deficiency     Social History   Tobacco Use  . Smoking status: Former Smoker    Types: Cigarettes    Quit date: 10/14/2017    Years since quitting: 3.1  . Smokeless tobacco: Never Used  . Tobacco comment: Quit 3 weeks ago  Vaping Use  . Vaping Use: Former  Substance Use Topics  . Alcohol use: Yes    Comment: occ  . Drug use: Not Currently   Outpatient Medications Prior to Visit  Medication Sig Dispense Refill  . albuterol (VENTOLIN HFA) 108 (90 Base) MCG/ACT inhaler Inhale 1-2 puffs into the lungs every 6 (six) hours as needed for wheezing or shortness of breath. 18 g 3  . baclofen (LIORESAL) 10 MG tablet Take by mouth.    . fluticasone (FLONASE) 50 MCG/ACT nasal spray Place 2 sprays into both nostrils daily. 16 mL 0  . hydrOXYzine (ATARAX/VISTARIL) 50 MG tablet Take 1 tablet (50 mg total) by mouth 3 (three) times daily as needed for anxiety. 90 tablet 1  . bictegravir-emtricitabine-tenofovir AF (BIKTARVY) 50-200-25 MG TABS tablet TAKE 1 TABLET BY MOUTH 1 TIME A DAY WITH OR WITHOUT FOOD AT THE SAME TIME EACH DAY. 30 tablet 1  . benzonatate (TESSALON) 100 MG capsule Take 1 capsule (100 mg total) by mouth every 8 (eight) hours. 21 capsule 0  . zonisamide (ZONEGRAN) 25 MG capsule Take 50 mg by mouth 2 (two) times daily.  No facility-administered medications prior to visit.   Allergies  Allergen Reactions  . Hydrocodone-Acetaminophen Itching    Patient reported  . Kiwi Extract Other (See Comments)    Numbing    Social History   Socioeconomic History  . Marital status: Legally Separated    Spouse name: Not on file  . Number of children: 1  . Years of education: Not on file  . Highest education level: Bachelor's degree (e.g., BA, AB, BS)  Occupational History  . Occupation: Pharmacist, hospital  Tobacco Use  . Smoking status: Former Smoker    Types: Cigarettes    Quit date: 10/14/2017    Years since quitting: 3.1  . Smokeless tobacco: Never Used  . Tobacco comment:  Quit 3 weeks ago  Vaping Use  . Vaping Use: Former  Substance and Sexual Activity  . Alcohol use: Yes    Comment: occ  . Drug use: Not Currently  . Sexual activity: Yes    Partners: Male    Birth control/protection: None  Other Topics Concern  . Not on file  Social History Narrative  . Not on file   Social Determinants of Health   Financial Resource Strain: Not on file  Food Insecurity: Not on file  Transportation Needs: Not on file  Physical Activity: Not on file  Stress: Not on file  Social Connections: Not on file     OBJECTIVE: Vitals:   11/30/20 1554  BP: 127/82  Pulse: 77  Resp: 16  SpO2: 100%  Weight: 127 lb (57.6 kg)  Height: 5' 3.25" (1.607 m)   Body mass index is 22.32 kg/m.   Physical Exam Vitals reviewed.  HENT:     Mouth/Throat:     Mouth: No oral lesions.     Dentition: No dental abscesses.  Eyes:     Pupils: Pupils are equal, round, and reactive to light.  Cardiovascular:     Rate and Rhythm: Normal rate and regular rhythm.     Heart sounds: Normal heart sounds. No murmur heard.   Pulmonary:     Effort: Pulmonary effort is normal. No respiratory distress.     Breath sounds: Normal breath sounds.  Abdominal:     General: There is no distension.     Palpations: Abdomen is soft.     Tenderness: There is no abdominal tenderness.  Musculoskeletal:        General: No tenderness. Normal range of motion.  Lymphadenopathy:     Cervical: No cervical adenopathy.  Skin:    General: Skin is warm and dry.     Findings: No rash.  Neurological:     Mental Status: She is alert and oriented to person, place, and time.  Psychiatric:        Mood and Affect: Mood and affect normal.        Judgment: Judgment normal.     Comments: In good spirits.     HIV 1 RNA Quant  Date Value  11/30/2020 <20 Copies/mL (H)  04/25/2020 <20 Copies/mL  12/13/2019 <20 NOT DETECTED copies/mL   CD4 T Cell Abs (/uL)  Date Value  11/30/2020 1,120  12/13/2019 961   06/15/2019 927   Lab Results  Component Value Date   HAV NON-REACTIVE 05/12/2017   HBEAG NON-REACTIVE 11/30/2020   HEPCAB NON-REACTIVE 05/12/2017   Lab Results  Component Value Date   WBC 3.9 (L) 01/28/2020   HGB 12.7 01/28/2020   HCT 38.1 01/28/2020   MCV 89.8 01/28/2020   PLT 192.0 01/28/2020  Lab Results  Component Value Date   CREATININE 0.91 11/30/2020   CREATININE 1.02 01/28/2020   CREATININE 0.95 06/15/2019   Lab Results  Component Value Date   ALT 20 11/30/2020   AST 22 11/30/2020   GGT 16 06/23/2017   ALKPHOS 31 (L) 01/28/2020   BILITOT 0.3 11/30/2020    Problem List Items Addressed This Visit      High   Human immunodeficiency virus (HIV) disease ([place])    Stable and under good control on Biktarvy with undetectable VL and healthy reconstitution of immune system with CD4 > 500 persistently. No changes in her health history and no new medications at this time.  Will have her update pertinent lab work and have her back in 6 months.  Will need to discuss pneumonia vaccines and menveo at upcoming visit.       Relevant Medications   bictegravir-emtricitabine-tenofovir AF (BIKTARVY) 50-200-25 MG TABS tablet     Unprioritized   Hepatitis B infection    She has been doing much better with biktarvy and missing less doses. Viral load has reduced again and not detected. Will update pertinent labs including e-antigen and CMP.  Will need another U/S probably near the age of 51.  Pneumonia vaccines recommended.  She has hepatitis A vaccines completed        Relevant Medications   bictegravir-emtricitabine-tenofovir AF (BIKTARVY) 50-200-25 MG TABS tablet   Other Relevant Orders   Hepatitis B DNA, ultraquantitative, PCR (Completed)   Comp Met (CMET) (Completed)   Hepatitis B e antigen (Completed)   Depression    Acutely a little worse than before. We spent some time discussing interventions for her. She is considering counseling services again - would like to  look at resources outside the ID clinic. She will contact me if she needs help with referrals.        Other Visit Diagnoses    Asymptomatic HIV infection ([place])    -  Primary   Relevant Medications   bictegravir-emtricitabine-tenofovir AF (BIKTARVY) 50-200-25 MG TABS tablet   Other Relevant Orders   HIV-1 RNA quant-no reflex-bld (Completed)   T-helper cell (CD4)- ([facility] clinic only) (Completed)     Janene Madeira, MSN, NP-C [facility] [email or web address] Pager: [phone] Office: [phone] [place]: [phone]

## 2020-11-30 NOTE — Patient Instructions (Addendum)
Always nice to see you!  Please stop by the lab on your way out.   Refills sent in for your BIktarvy   Will see you back in 6 months - enjoy the summer!

## 2020-12-01 LAB — T-HELPER CELL (CD4) - (RCID CLINIC ONLY)
CD4 % Helper T Cell: 44 % (ref 33–65)
CD4 T Cell Abs: 1120 /uL (ref 400–1790)

## 2020-12-03 LAB — COMPREHENSIVE METABOLIC PANEL
AG Ratio: 1.4 (calc) (ref 1.0–2.5)
ALT: 20 U/L (ref 6–29)
AST: 22 U/L (ref 10–30)
Albumin: 4.4 g/dL (ref 3.6–5.1)
Alkaline phosphatase (APISO): 30 U/L — ABNORMAL LOW (ref 31–125)
BUN: 11 mg/dL (ref 7–25)
CO2: 26 mmol/L (ref 20–32)
Calcium: 9.7 mg/dL (ref 8.6–10.2)
Chloride: 105 mmol/L (ref 98–110)
Creat: 0.91 mg/dL (ref 0.50–1.10)
Globulin: 3.1 g/dL (calc) (ref 1.9–3.7)
Glucose, Bld: 89 mg/dL (ref 65–99)
Potassium: 4.4 mmol/L (ref 3.5–5.3)
Sodium: 136 mmol/L (ref 135–146)
Total Bilirubin: 0.3 mg/dL (ref 0.2–1.2)
Total Protein: 7.5 g/dL (ref 6.1–8.1)

## 2020-12-03 LAB — HEPATITIS B E ANTIGEN: Hep B E Ag: NONREACTIVE

## 2020-12-03 LAB — HEPATITIS B DNA, ULTRAQUANTITATIVE, PCR
Hepatitis B DNA (Calc): <1 Log IU/mL
Hepatitis B DNA: <10 IU/mL

## 2020-12-03 LAB — HIV-1 RNA QUANT-NO REFLEX-BLD
HIV 1 RNA Quant: <20 Copies/mL — ABNORMAL HIGH
HIV-1 RNA Quant, Log: <1.3 Log cps/mL — ABNORMAL HIGH

## 2020-12-21 NOTE — Assessment & Plan Note (Signed)
She has been doing much better with biktarvy and missing less doses. Viral load has reduced again and not detected. Will update pertinent labs including e-antigen and CMP.  Will need another U/S probably near the age of 89.  Pneumonia vaccines recommended.  She has hepatitis A vaccines completed

## 2020-12-21 NOTE — Assessment & Plan Note (Signed)
Stable and under good control on Biktarvy with undetectable VL and healthy reconstitution of immune system with CD4 > 500 persistently. No changes in her health history and no new medications at this time.  Will have her update pertinent lab work and have her back in 6 months.  Will need to discuss pneumonia vaccines and menveo at upcoming visit.

## 2020-12-21 NOTE — Assessment & Plan Note (Addendum)
Acutely a little worse than before. We spent some time discussing interventions for her. She is considering counseling services again - would like to look at resources outside the ID clinic. She will contact me if she needs help with referrals.

## 2020-12-27 ENCOUNTER — Ambulatory Visit (INDEPENDENT_AMBULATORY_CARE_PROVIDER_SITE_OTHER): Payer: Medicaid Other | Admitting: Family Medicine

## 2020-12-27 ENCOUNTER — Encounter: Payer: Self-pay | Admitting: Family Medicine

## 2020-12-27 ENCOUNTER — Other Ambulatory Visit (HOSPITAL_COMMUNITY)
Admission: RE | Admit: 2020-12-27 | Discharge: 2020-12-27 | Disposition: A | Discharge status: Home / Self Care | Payer: BC Managed Care – PPO | Source: Ambulatory Visit | Attending: Family Medicine | Admitting: Family Medicine

## 2020-12-27 ENCOUNTER — Other Ambulatory Visit: Payer: Self-pay

## 2020-12-27 VITALS — Ht 63.0 in | Wt 125.6 lb

## 2020-12-27 DIAGNOSIS — B20 Human immunodeficiency virus [HIV] disease: Secondary | ICD-10-CM | POA: Diagnosis not present

## 2020-12-27 DIAGNOSIS — D259 Leiomyoma of uterus, unspecified: Secondary | ICD-10-CM

## 2020-12-27 DIAGNOSIS — F32A Depression, unspecified: Secondary | ICD-10-CM | POA: Diagnosis not present

## 2020-12-27 DIAGNOSIS — R87612 Low grade squamous intraepithelial lesion on cytologic smear of cervix (LGSIL): Secondary | ICD-10-CM | POA: Insufficient documentation

## 2020-12-27 DIAGNOSIS — Z01419 Encounter for gynecological examination (general) (routine) without abnormal findings: Secondary | ICD-10-CM

## 2020-12-27 NOTE — Progress Notes (Signed)
GYNECOLOGY ANNUAL PREVENTATIVE CARE ENCOUNTER NOTE  Subjective:   Angelica Hopkins is a 40 y.o. G75P1011 female here for a routine annual gynecologic exam.  Current complaints: Has an occasional heavy period that has a shorter interval. Has known 5-6cm fibroid. Additionally, has h/o abnormal PAP, but hasn't had leep.   Denies abnormal vaginal bleeding, discharge, pelvic pain, problems with intercourse or other gynecologic concerns.    Gynecologic History Patient's last menstrual period was 12/04/2020 (exact date). Patient is sexually active  Contraception: none Last Pap: 12/2019. Results were: LSIL Last mammogram: n/a.  Obstetric History OB History  Gravida Para Term Preterm AB Living  2 1 1  0 1 1  SAB IAB Ectopic Multiple Live Births  1 0 0 0 1    # Outcome Date GA Lbr Len/2nd Weight Sex Delivery Anes PTL Lv  2 Term 11/17/01 [redacted]w[redacted]d  7 lb 9 oz (3.43 kg) F CS-LTranv  Y LIV  1 SAB             Past Medical History:  Diagnosis Date  . Abnormal Pap smear of cervix    LSISL, colposcopy 2012  . Asthma   . Depression   . Hepatitis B infection    hepatitis B  . HIV (human immunodeficiency virus infection) ([place])   . Migraines    per patient   . 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 Panic disorder   . Vitamin D deficiency     Past Surgical History:  Procedure Laterality Date  . APPENDECTOMY    . CESAREAN SECTION     one previous  . RADIOACTIVE SEED GUIDED EXCISIONAL BREAST BIOPSY Left 03/04/2019   Procedure: RADIOACTIVE SEED GUIDED EXCISIONAL LEFT BREAST BIOPSY AND EXCISION OF LEFT BREAST MASS;  Surgeon: [name], MD;  Location: [facility];  Service: General;  Laterality: Left;    Current Outpatient Medications on File Prior to Visit  Medication Sig Dispense Refill  . albuterol (VENTOLIN HFA) 108 (90 Base) MCG/ACT inhaler Inhale 1-2 puffs into the lungs every 6 (six) hours as needed for wheezing or shortness of breath. 18 g 3  .  bictegravir-emtricitabine-tenofovir AF (BIKTARVY) 50-200-25 MG TABS tablet TAKE 1 TABLET BY MOUTH 1 TIME A DAY WITH OR WITHOUT FOOD AT THE SAME TIME EACH DAY. 30 tablet 11  . hydrOXYzine (ATARAX/VISTARIL) 50 MG tablet Take 1 tablet (50 mg total) by mouth 3 (three) times daily as needed for anxiety. 90 tablet 1  . fluticasone (FLONASE) 50 MCG/ACT nasal spray Place 2 sprays into both nostrils daily. (Patient not taking: Reported on 12/27/2020) 16 mL 0   No current facility-administered medications on file prior to visit.    Allergies  Allergen Reactions  . Hydrocodone-Acetaminophen Itching    Patient reported  . Kiwi Extract Other (See Comments)    Numbing    Social History   Socioeconomic History  . Marital status: Legally Separated    Spouse name: Not on file  . Number of children: 1  . Years of education: Not on file  . Highest education level: Bachelor's degree (e.g., BA, AB, BS)  Occupational History  . Occupation: Pharmacist, hospital  Tobacco Use  . Smoking status: Former Smoker    Types: Cigarettes    Quit date: 10/14/2017    Years since quitting: 3.2  . Smokeless tobacco: Never Used  Vaping Use  . Vaping Use: Former  Substance and Sexual Activity  . Alcohol use: Yes    Comment: occ  .  Drug use: Not Currently  . Sexual activity: Yes    Partners: Male    Birth control/protection: None  Other Topics Concern  . Not on file  Social History Narrative  . Not on file   Social Determinants of Health   Financial Resource Strain: Not on file  Food Insecurity: Not on file  Transportation Needs: Not on file  Physical Activity: Not on file  Stress: Not on file  Social Connections: Not on file  Intimate Partner Violence: Not on file    Family History  Problem Relation Age of Onset  . Uterine cancer Maternal Aunt   . Lung cancer Maternal Grandmother   . Liver disease Mother   . HIV Mother   . Drug abuse Mother   . HIV Father   . Diabetes Father   . Drug abuse Father   .  Healthy Brother   . Breast cancer Paternal Aunt   . Stomach cancer Paternal Grandmother   . Healthy Sister   . Healthy Brother   . Healthy Sister   . Healthy Daughter   . Colon cancer Neg Hx     The following portions of the patient's history were reviewed and updated as appropriate: allergies, current medications, past family history, past medical history, past social history, past surgical history and problem list.  Review of Systems Pertinent items are noted in HPI.   Objective:  Ht 5\' 3"  (1.6 m)   Wt 125 lb 9.6 oz (57 kg)   LMP 12/04/2020 (Exact Date)   BMI 22.25 kg/m  Wt Readings from Last 3 Encounters:  12/27/20 125 lb 9.6 oz (57 kg)  11/30/20 127 lb (57.6 kg)  07/17/20 127 lb 12.8 oz (58 kg)     Chaperone present during exam  CONSTITUTIONAL: Well-developed, well-nourished female in no acute distress.  HENT:  Normocephalic, atraumatic, External right and left ear normal. Oropharynx is clear and moist EYES: Conjunctivae and EOM are normal. Pupils are equal, round, and reactive to light. No scleral icterus.  NECK: Normal range of motion, supple, no masses.  Normal thyroid.   CARDIOVASCULAR: Normal heart rate noted, regular rhythm RESPIRATORY: Clear to auscultation bilaterally. Effort and breath sounds normal, no problems with respiration noted. BREASTS: declined. ABDOMEN: Soft, normal bowel sounds, no distention noted.  No tenderness, rebound or guarding.  PELVIC: Normal appearing external genitalia; normal appearing vaginal mucosa and cervix.  No abnormal discharge noted.  Normal uterine size, no other palpable masses, no uterine or adnexal tenderness. MUSCULOSKELETAL: Normal range of motion. No tenderness.  No cyanosis, clubbing, or edema.  2+ distal pulses. SKIN: Skin is warm and dry. No rash noted. Not diaphoretic. No erythema. No pallor. NEUROLOGIC: Alert and oriented to person, place, and time. Normal reflexes, muscle tone coordination. No cranial nerve deficit  noted. PSYCHIATRIC: Normal mood and affect. Normal behavior. Normal judgment and thought content.  Assessment:  Annual gynecologic examination with pap smear   Plan:  1. Well Woman Exam Will follow up results of pap smear and manage accordingly. STD testing discussed. Patient requested testing  2. Depression, unspecified depression type Having depression.  - Ambulatory referral to [place]  3. Human immunodeficiency virus (HIV) disease ([place]) Controlled with undetectable counts  4. LGSIL on Pap smear of cervix Rpt PAP  5. Uterine leiomyoma, unspecified location Will refer to Gyn surgeon for evaluation for [place].    Routine preventative health maintenance measures emphasized. Please refer to After Visit Summary for other counseling recommendations.    Edison Nasuti  Nehemiah Settle, [place] for [employer]

## 2021-01-02 LAB — CYTOLOGY - PAP
Adequacy: ABSENT
Comment: NEGATIVE
Comment: NEGATIVE
Diagnosis: NEGATIVE
HPV 16: POSITIVE — AB
HPV 18 / 45: NEGATIVE
High risk HPV: POSITIVE — AB

## 2021-01-18 NOTE — BH Specialist Note (Signed)
Pt did not arrive to video visit and did not answer the phone; Left HIPPA-compliant message to call back Donold Marotto from [facility] at  [facility] at  [phone] (Krishiv Sandler's office).  ?; left MyChart message for patient.  ? ?

## 2021-01-31 ENCOUNTER — Ambulatory Visit (INDEPENDENT_AMBULATORY_CARE_PROVIDER_SITE_OTHER): Payer: BC Managed Care – PPO | Admitting: Obstetrics and Gynecology

## 2021-01-31 ENCOUNTER — Other Ambulatory Visit (HOSPITAL_COMMUNITY)
Admission: RE | Admit: 2021-01-31 | Discharge: 2021-01-31 | Disposition: A | Discharge status: Home / Self Care | Payer: BC Managed Care – PPO | Source: Ambulatory Visit | Attending: Obstetrics and Gynecology | Admitting: Obstetrics and Gynecology

## 2021-01-31 ENCOUNTER — Other Ambulatory Visit: Payer: Self-pay

## 2021-01-31 ENCOUNTER — Encounter: Payer: Self-pay | Admitting: Obstetrics and Gynecology

## 2021-01-31 DIAGNOSIS — Z3202 Encounter for pregnancy test, result negative: Secondary | ICD-10-CM | POA: Diagnosis not present

## 2021-01-31 DIAGNOSIS — B20 Human immunodeficiency virus [HIV] disease: Secondary | ICD-10-CM

## 2021-01-31 DIAGNOSIS — D259 Leiomyoma of uterus, unspecified: Secondary | ICD-10-CM

## 2021-01-31 DIAGNOSIS — R87612 Low grade squamous intraepithelial lesion on cytologic smear of cervix (LGSIL): Secondary | ICD-10-CM

## 2021-01-31 DIAGNOSIS — B977 Papillomavirus as the cause of diseases classified elsewhere: Secondary | ICD-10-CM | POA: Insufficient documentation

## 2021-01-31 LAB — POCT PREGNANCY, URINE: Preg Test, Ur: NEGATIVE

## 2021-01-31 NOTE — Progress Notes (Signed)
40 yo G2P1 seen at [facility].  Last pap was normal with high risk HPV.  Previous pap a year ago had LGSIL with high risk HPV.  Pt does have HIV as well.  Patient given informed consent, signed copy in the chart, time out was performed. UPT was negative. Placed in lithotomy position. Cervix viewed with speculum and colposcope after application of acetic acid.   Colposcopy adequate?  no Acetowhite lesions?none, no nonstaining with lugol's Punctation?none Mosaicism?  none Abnormal vasculature?  none Biopsies? none ECC?yes  COMMENTS: Patient was given post procedure instructions.  Future care will be dictated by pathology results.  Griffin Basil, MD

## 2021-01-31 NOTE — Addendum Note (Signed)
Addended by: Bethanne Ginger on: 01/31/2021 11:37 AM   Modules accepted: Orders

## 2021-02-01 ENCOUNTER — Ambulatory Visit: Payer: BC Managed Care – PPO

## 2021-02-01 ENCOUNTER — Ambulatory Visit: Payer: BC Managed Care – PPO | Admitting: Clinical

## 2021-02-01 DIAGNOSIS — Z5329 Procedure and treatment not carried out because of patient's decision for other reasons: Secondary | ICD-10-CM

## 2021-02-01 DIAGNOSIS — Z91199 Patient's noncompliance with other medical treatment and regimen due to unspecified reason: Secondary | ICD-10-CM

## 2021-02-02 LAB — SURGICAL PATHOLOGY

## 2021-02-07 ENCOUNTER — Ambulatory Visit: Payer: BC Managed Care – PPO

## 2021-03-16 IMAGING — MG STEREOTACTIC CORE NEEDLE BIOPSY
7 of 10 series · 7 of 22 positions shown · non-contrast
Comparison: Previous exams.
COMPARISON: Previous exams.
COMPARISON: Previous exams.

Addendum:
CLINICAL DATA: 37-year-old female with indeterminate calcifications
in the lower inner left breast.

EXAM:
LEFT BREAST STEREOTACTIC CORE NEEDLE BIOPSY

[L (1 of 6)]
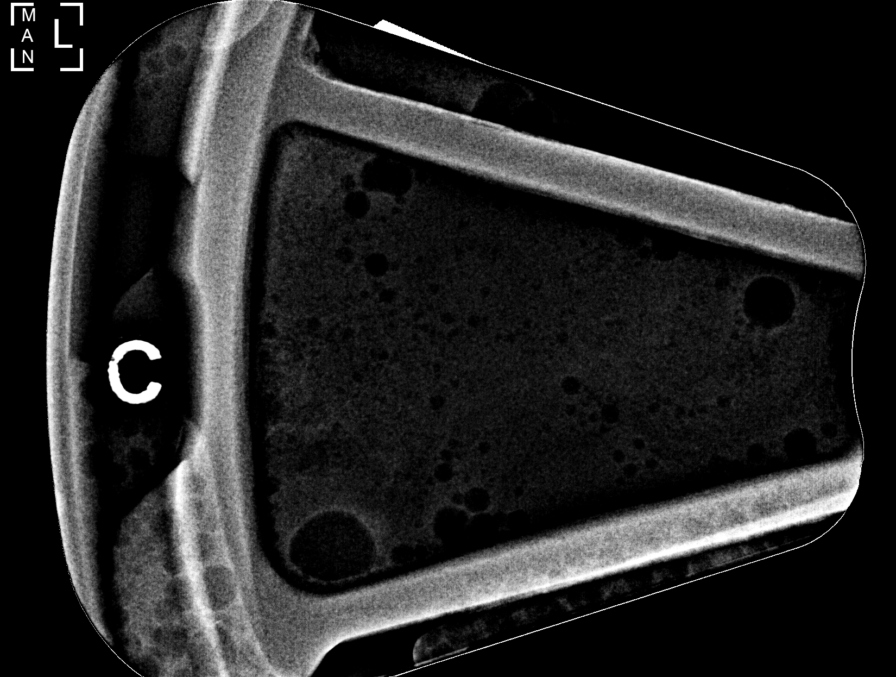

[L (2 of 6)]
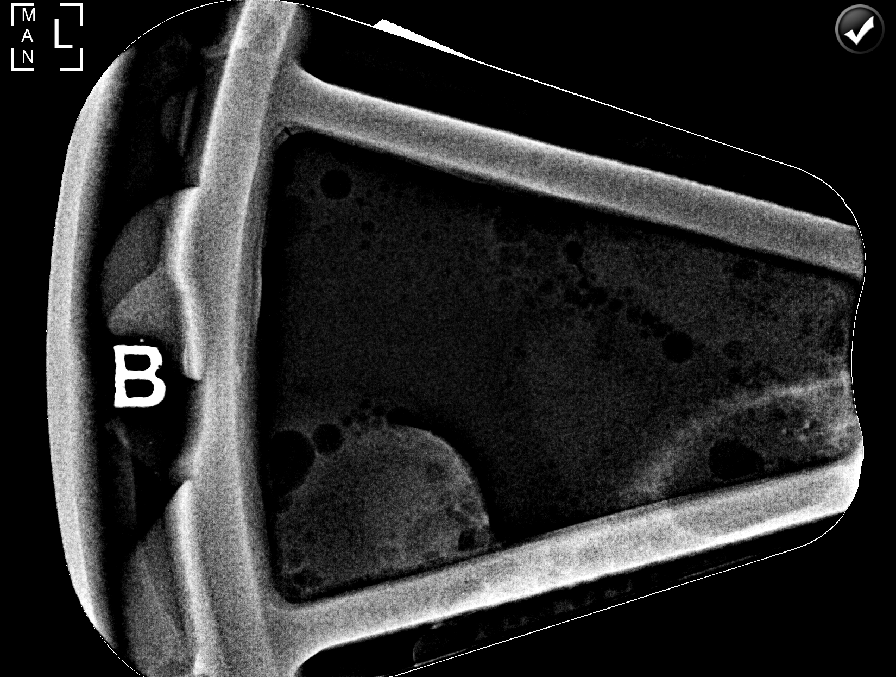

[L (3 of 6)]
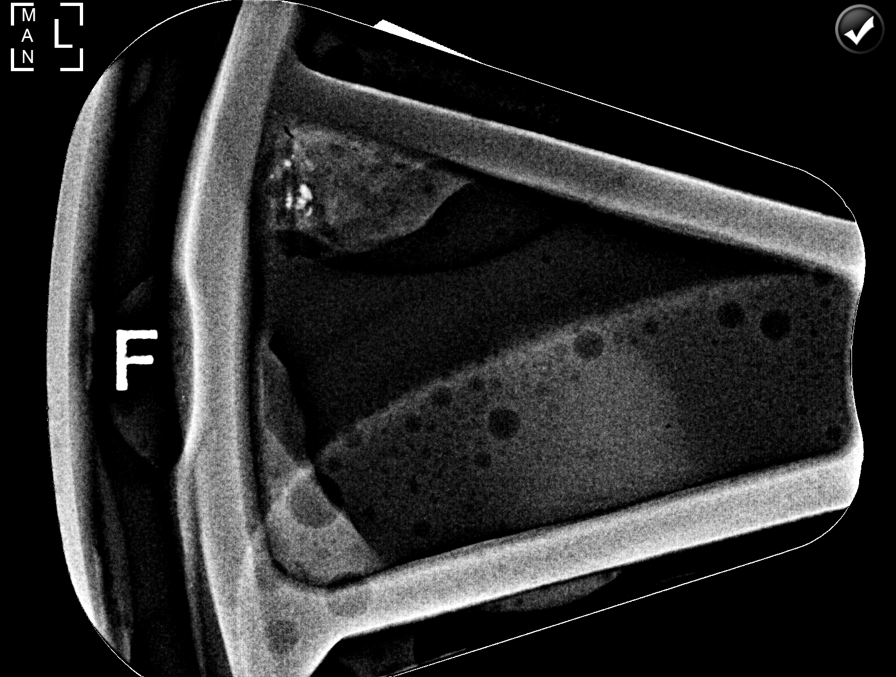

[L (4 of 6)]
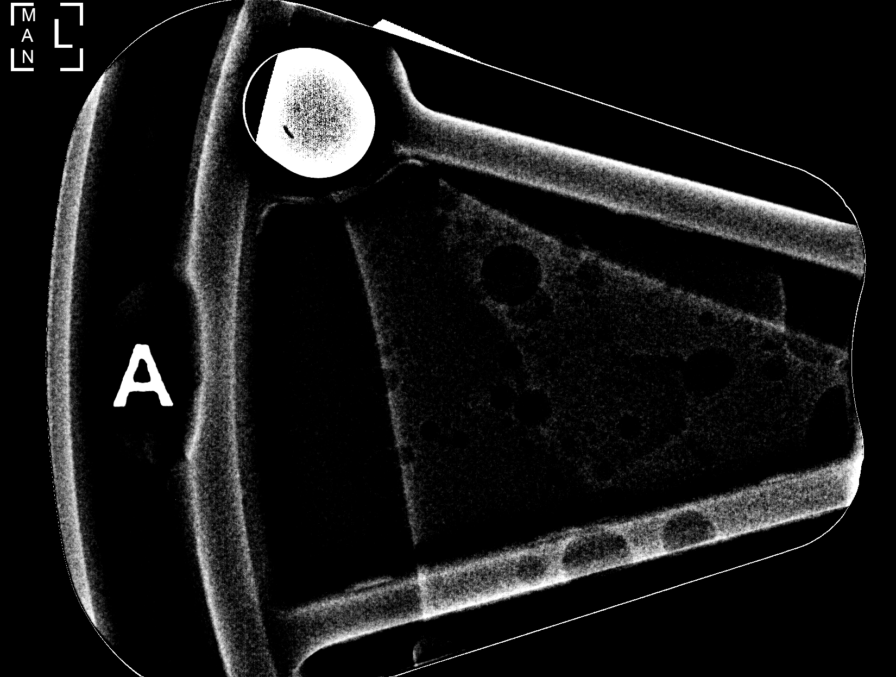

[L (5 of 6)]
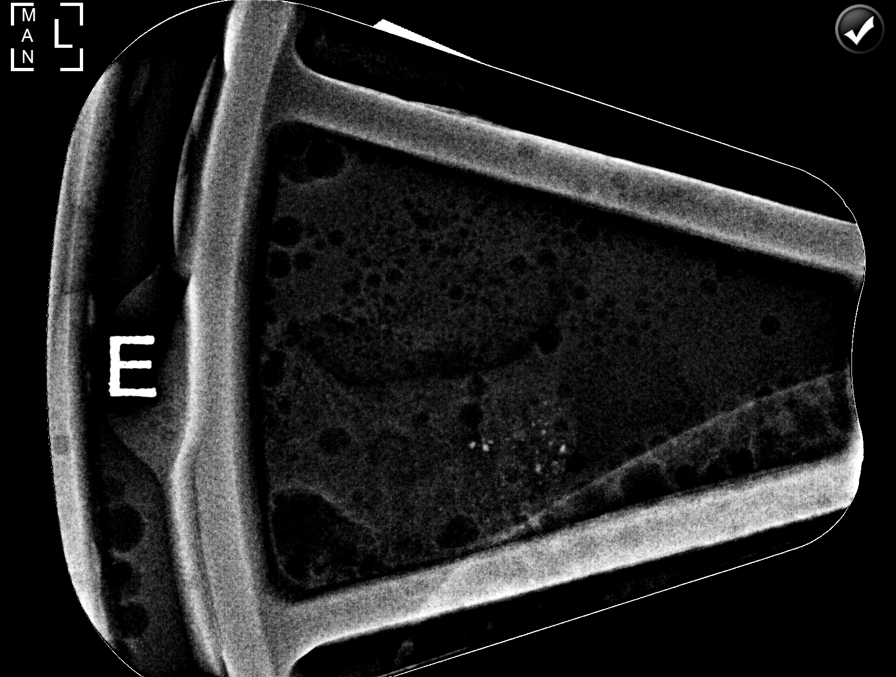

[L (6 of 6)]
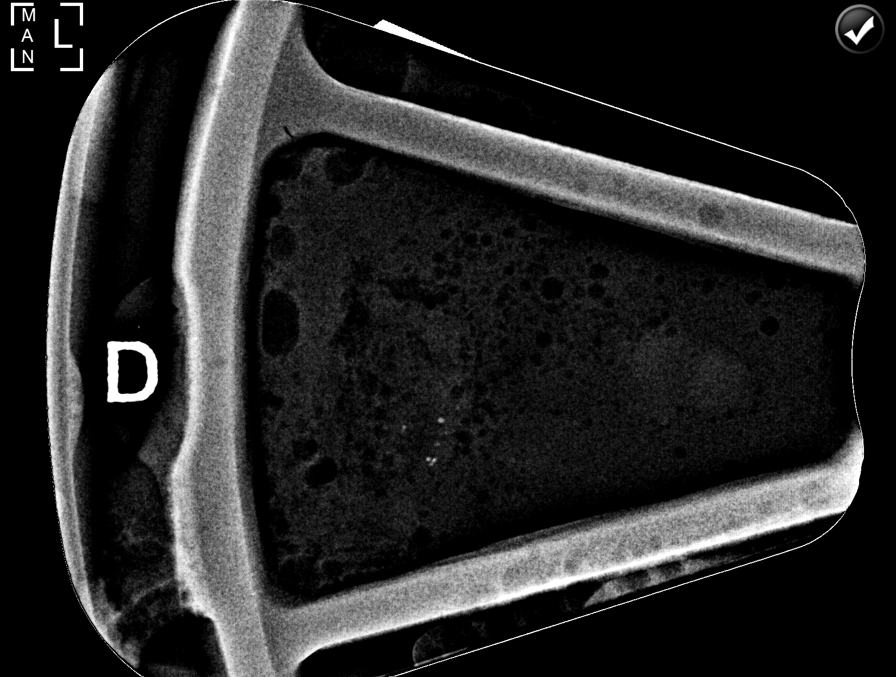

[L ML]
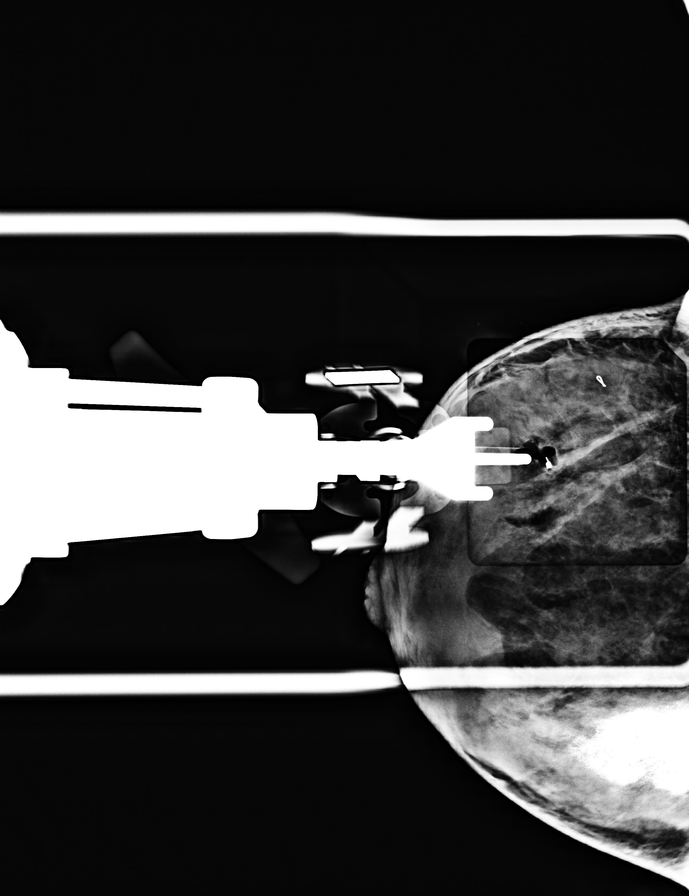

[7 of 22 positions shown; findings below may reference images not displayed]



Using sterile technique and 1% Lidocaine as local anesthetic, under
stereotactic guidance, a 9 gauge vacuum assisted device was used to
perform core needle biopsy of calcifications in the lower inner left
breast using a medial to lateral approach. Specimen radiograph was
performed showing the presence of calcifications. Specimens with
calcifications are identified for pathology.

Lesion quadrant: Lower inner

At the conclusion of the procedure, a coil shaped tissue marker clip
was deployed into the biopsy cavity. Follow-up 2-view mammogram was
performed and dictated separately.
IMPRESSION: Stereotactic-guided biopsy of the calcifications in the lower inner
left breast, at site of coil shaped biopsy marking clip. No apparent
complications.

ADDENDUM:
Pathology revealed FIBROADENOMA of the LEFT breast, 7 o'clock mass.
This was found to be concordant by Dr. Josy Diep.

Pathology revealed FLAT EPITHELIAL ATYPIA WITH CALCIFICATIONS of the
LEFT breast, lower inner quadrant calcifications. This was found to
be concordant by Dr. Josy Diep, with surgical consultation for
excision recommended.

Pathology results were discussed with the patient by telephone. The
patient reported doing well after the biopsy with tenderness at the
site. Post biopsy instructions and care were reviewed and questions
were answered. The patient was encouraged to call The [REDACTED]

Surgical consultation has been arranged with Dr. Bright Nana Huges at
[REDACTED] on February 08, 2019 per patient request.

Pathology results reported by Teneika Ac, RN on 02/01/2019.

ADDENDUM:
Pathology revealed FIBROADENOMA of the Left breast, 7 o'clock mass.
This was found to be concordant by Dr. Josy Diep.

Pathology revealed FLAT EPITHELIAL ATYPIA WITH CALCIFICATIONS of the
LEFT breast, lower inner quadrant calcifications. This was found to
be concordant by Dr. Josy Diep, with excision recommended.

Pathology results were discussed with the patient by telephone. The
patient reported doing well after the biopsy with tenderness at the
site. Post biopsy instructions and care were reviewed and questions
were answered. The patient was encouraged to call The [REDACTED]

Per patient request, surgical consultation has been arranged with
Dr. Bright Nana Huges at [REDACTED] on February 08, 2019.

Pathology results reported by Teneika Ac, RN on 02/02/2019.

*** End of Addendum ***
Addendum:
FINDINGS: The patient and I discussed the procedure of stereotactic-guided
biopsy including benefits and alternatives. We discussed the high
likelihood of a successful procedure. We discussed the risks of the
procedure including infection, bleeding, tissue injury, clip
migration, and inadequate sampling. Informed written consent was
given. The usual time out protocol was performed immediately prior
to the procedure.

Using sterile technique and 1% Lidocaine as local anesthetic, under
stereotactic guidance, a 9 gauge vacuum assisted device was used to
perform core needle biopsy of calcifications in the lower inner left
breast using a medial to lateral approach. Specimen radiograph was
performed showing the presence of calcifications. Specimens with
calcifications are identified for pathology.

Lesion quadrant: Lower inner

At the conclusion of the procedure, a coil shaped tissue marker clip
was deployed into the biopsy cavity. Follow-up 2-view mammogram was
performed and dictated separately.
IMPRESSION: Stereotactic-guided biopsy of the calcifications in the lower inner
left breast, at site of coil shaped biopsy marking clip. No apparent
complications.

ADDENDUM:
Pathology revealed FIBROADENOMA of the LEFT breast, 7 o'clock mass.
This was found to be concordant by Dr. Josy Diep.

Pathology revealed FLAT EPITHELIAL ATYPIA WITH CALCIFICATIONS of the
LEFT breast, lower inner quadrant calcifications. This was found to
be concordant by Dr. Josy Diep, with surgical consultation for
excision recommended.

Pathology results were discussed with the patient by telephone. The
patient reported doing well after the biopsy with tenderness at the
site. Post biopsy instructions and care were reviewed and questions
were answered. The patient was encouraged to call The [REDACTED]

Surgical consultation has been arranged with Dr. Bright Nana Huges at
[REDACTED] on February 08, 2019 per patient request.

Pathology results reported by Teneika Ac, RN on 02/01/2019.



Using sterile technique and 1% Lidocaine as local anesthetic, under
stereotactic guidance, a 9 gauge vacuum assisted device was used to
perform core needle biopsy of calcifications in the lower inner left
breast using a medial to lateral approach. Specimen radiograph was
performed showing the presence of calcifications. Specimens with
calcifications are identified for pathology.

Lesion quadrant: Lower inner

At the conclusion of the procedure, a coil shaped tissue marker clip
was deployed into the biopsy cavity. Follow-up 2-view mammogram was
performed and dictated separately.
IMPRESSION: Stereotactic-guided biopsy of the calcifications in the lower inner
left breast, at site of coil shaped biopsy marking clip. No apparent
complications.

## 2021-03-16 IMAGING — US US BREAST BX W LOC DEV 1ST LESION IMG BX SPEC US GUIDE*L*
1 series · 8 of 8 positions shown · non-contrast
Comparison: Previous exam(s).
COMPARISON: Previous exam(s).
COMPARISON: Previous exam(s).

Addendum:
CLINICAL DATA: 37-year-old female with an indeterminate mass in the
left breast at the 7 o'clock position.

EXAM:
ULTRASOUND GUIDED LEFT BREAST CORE NEEDLE BIOPSY

[Series 1: us breast bx w loc dev 1st lesion img bx spec us g · 0.07mm/px · 8 of 8 slices shown]
[im 1/8]
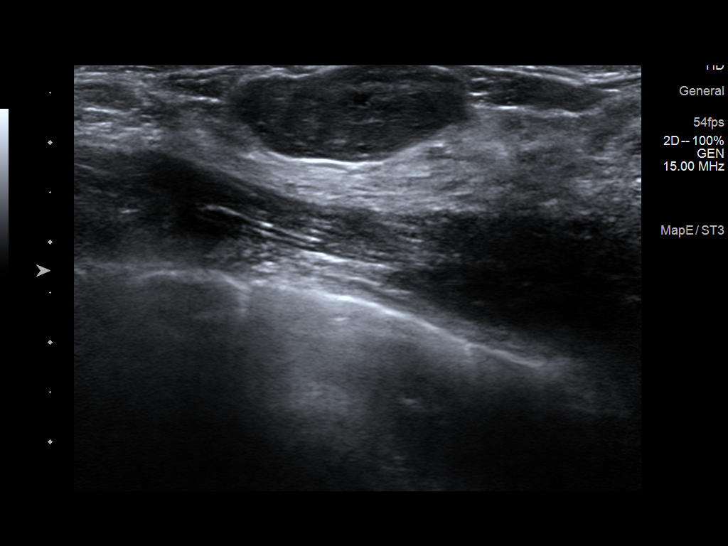
[im 2/8]
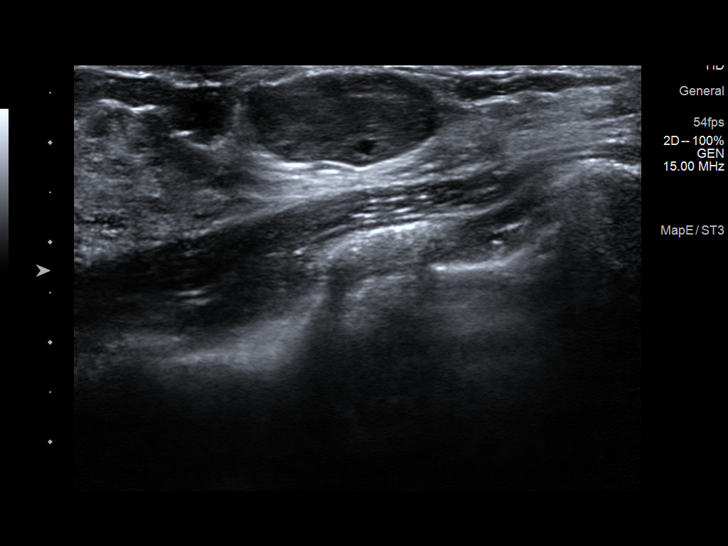
[im 3/8]
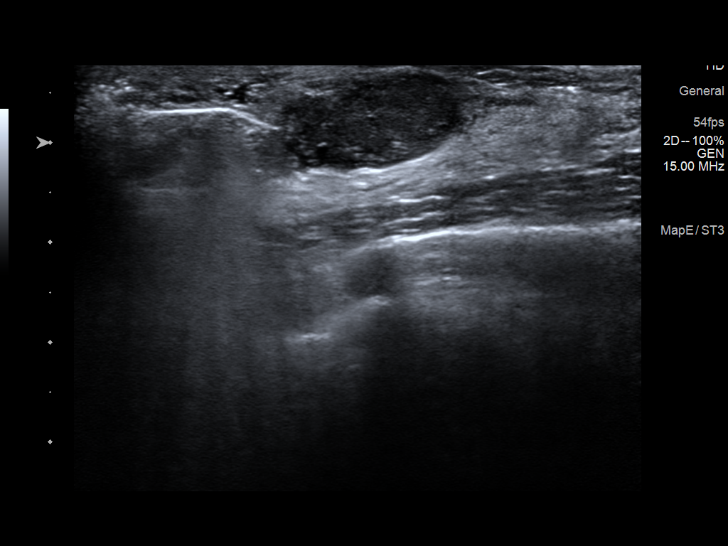
[im 4/8]
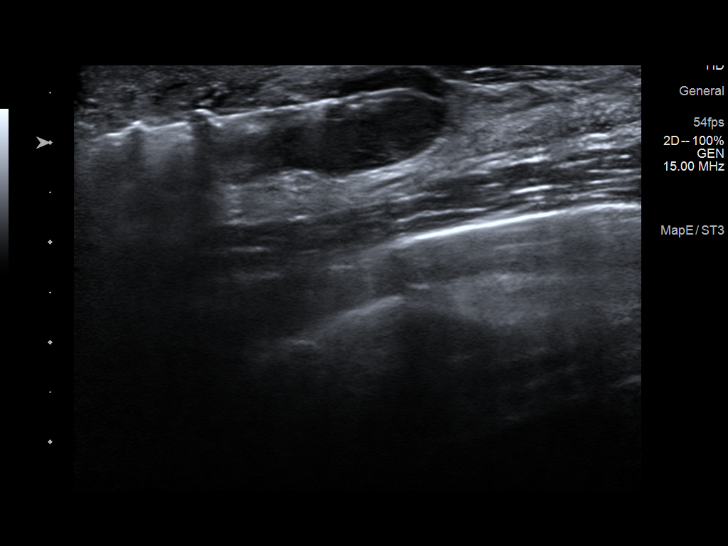
[im 5/8]
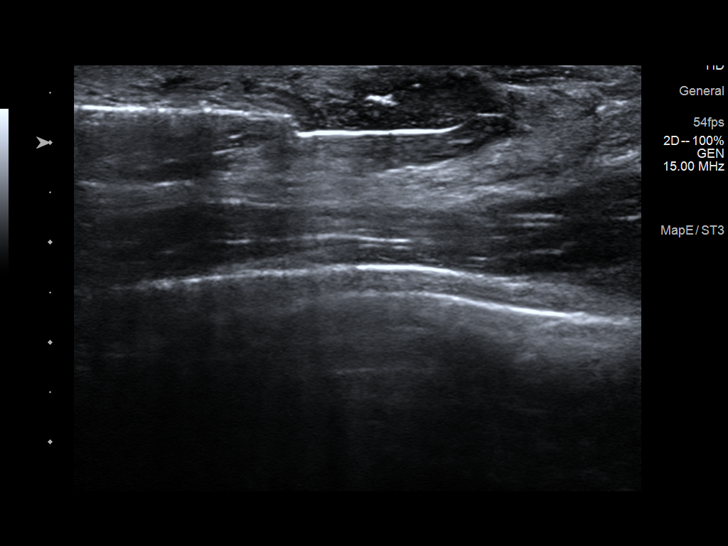
[im 6/8]
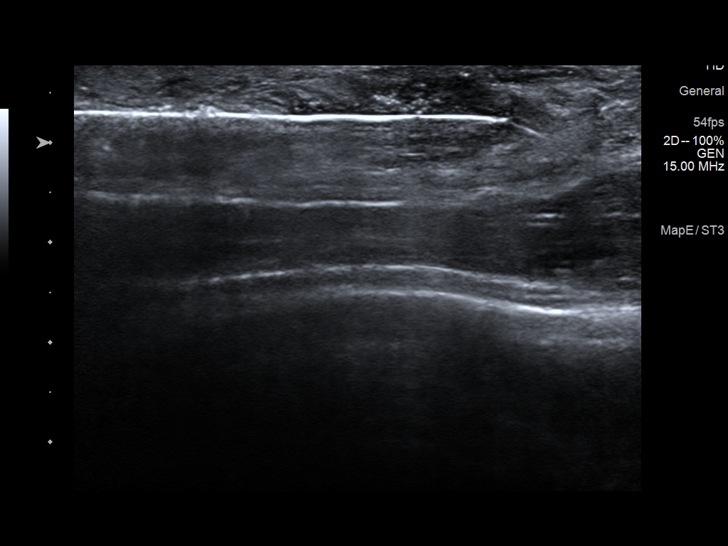
[im 7/8]
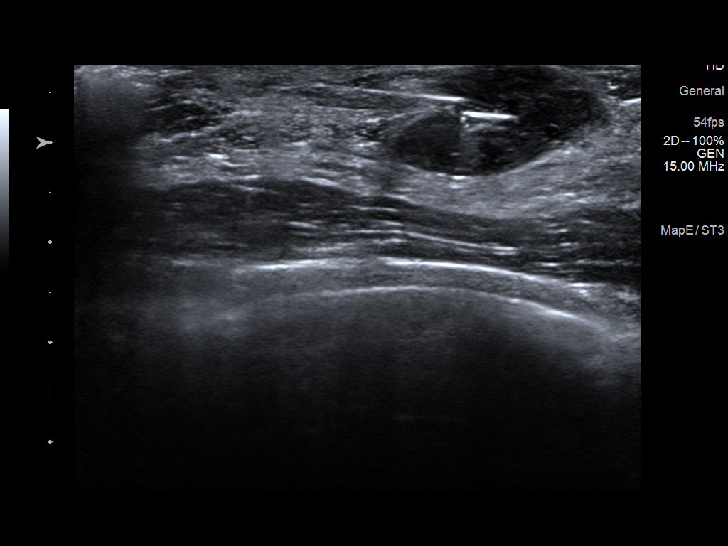
[im 8/8]
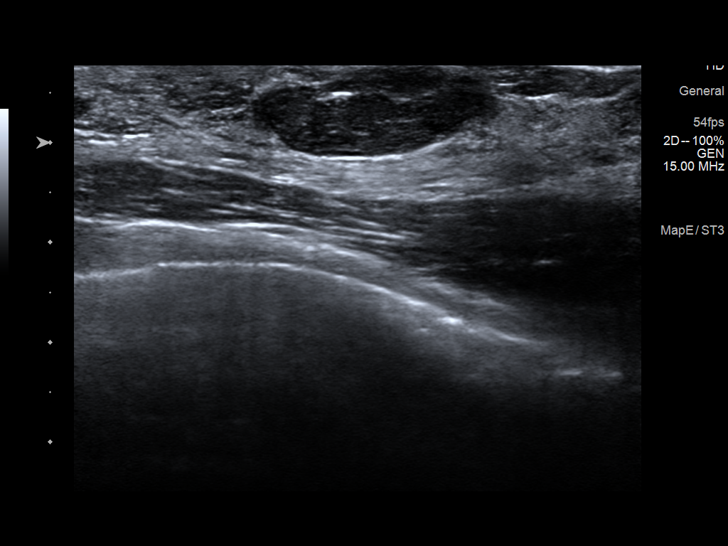

[8 of 8 positions shown; findings below may reference images not displayed]



Lesion quadrant: Lower inner

Using sterile technique and 1% Lidocaine as local anesthetic, under
direct ultrasound visualization, a 12 gauge Map device was
used to perform biopsy of the mass in the left breast at the 7
o'clock position using a medial to lateral approach. At the
conclusion of the procedure a ribbon shaped tissue marker clip was
deployed into the biopsy cavity. Follow up 2 view mammogram was
performed and dictated separately.
IMPRESSION: Ultrasound guided biopsy of the mass in the left breast at the 7
o'clock position, at site of ribbon shaped biopsy marking clip. No
apparent complications.

ADDENDUM:
Pathology revealed FIBROADENOMA of the LEFT breast, 7 o'clock mass.
This was found to be concordant by Dr. Jhemboy Padam.

Pathology revealed FLAT EPITHELIAL ATYPIA WITH CALCIFICATIONS of the
LEFT breast, lower inner quadrant calcifications. This was found to
be concordant by Dr. Jhemboy Padam, with surgical consultation for
excision recommended.

Pathology results were discussed with the patient by telephone. The
patient reported doing well after the biopsy with tenderness at the
site. Post biopsy instructions and care were reviewed and questions
were answered. The patient was encouraged to call The [REDACTED]

Surgical consultation has been arranged with Dr. Kuliner Angga Henii at
[REDACTED] on February 08, 2019 per patient request.

Pathology results reported by Claude Pascal Hessana, RN on 02/01/2019.

ADDENDUM:
Pathology revealed FIBROADENOMA of the Left breast, 7 o'clock mass.
This was found to be concordant by Dr. Jhemboy Padam.

Pathology revealed FLAT EPITHELIAL ATYPIA WITH CALCIFICATIONS of the
LEFT breast, lower inner quadrant calcifications. This was found to
be concordant by Dr. Jhemboy Padam, with excision recommended.

Pathology results were discussed with the patient by telephone. The
patient reported doing well after the biopsy with tenderness at the
site. Post biopsy instructions and care were reviewed and questions
were answered. The patient was encouraged to call The [REDACTED]

Per patient request, surgical consultation has been arranged with
Dr. Kuliner Angga Henii at [REDACTED] on February 08, 2019.

Pathology results reported by Claude Pascal Hessana, RN on 02/02/2019.

*** End of Addendum ***
Addendum:
FINDINGS: I met with the patient and we discussed the procedure of
ultrasound-guided biopsy, including benefits and alternatives. We
discussed the high likelihood of a successful procedure. We
discussed the risks of the procedure, including infection, bleeding,
tissue injury, clip migration, and inadequate sampling. Informed
written consent was given. The usual time-out protocol was performed
immediately prior to the procedure.

Lesion quadrant: Lower inner

Using sterile technique and 1% Lidocaine as local anesthetic, under
direct ultrasound visualization, a 12 gauge Map device was
used to perform biopsy of the mass in the left breast at the 7
o'clock position using a medial to lateral approach. At the
conclusion of the procedure a ribbon shaped tissue marker clip was
deployed into the biopsy cavity. Follow up 2 view mammogram was
performed and dictated separately.
IMPRESSION: Ultrasound guided biopsy of the mass in the left breast at the 7
o'clock position, at site of ribbon shaped biopsy marking clip. No
apparent complications.

ADDENDUM:
Pathology revealed FIBROADENOMA of the LEFT breast, 7 o'clock mass.
This was found to be concordant by Dr. Jhemboy Padam.

Pathology revealed FLAT EPITHELIAL ATYPIA WITH CALCIFICATIONS of the
LEFT breast, lower inner quadrant calcifications. This was found to
be concordant by Dr. Jhemboy Padam, with surgical consultation for
excision recommended.

Pathology results were discussed with the patient by telephone. The
patient reported doing well after the biopsy with tenderness at the
site. Post biopsy instructions and care were reviewed and questions
were answered. The patient was encouraged to call The [REDACTED]

Surgical consultation has been arranged with Dr. Kuliner Angga Henii at
[REDACTED] on February 08, 2019 per patient request.

Pathology results reported by Claude Pascal Hessana, RN on 02/01/2019.



Lesion quadrant: Lower inner

Using sterile technique and 1% Lidocaine as local anesthetic, under
direct ultrasound visualization, a 12 gauge Map device was
used to perform biopsy of the mass in the left breast at the 7
o'clock position using a medial to lateral approach. At the
conclusion of the procedure a ribbon shaped tissue marker clip was
deployed into the biopsy cavity. Follow up 2 view mammogram was
performed and dictated separately.
IMPRESSION: Ultrasound guided biopsy of the mass in the left breast at the 7
o'clock position, at site of ribbon shaped biopsy marking clip. No
apparent complications.

## 2021-06-13 ENCOUNTER — Other Ambulatory Visit: Payer: Self-pay

## 2021-06-13 ENCOUNTER — Ambulatory Visit (HOSPITAL_COMMUNITY)
Admission: EM | Admit: 2021-06-13 | Discharge: 2021-06-13 | Disposition: A | Discharge status: Home / Self Care | Payer: BC Managed Care – PPO | Attending: Urgent Care | Admitting: Urgent Care

## 2021-06-13 ENCOUNTER — Encounter (HOSPITAL_COMMUNITY): Payer: Self-pay | Admitting: Emergency Medicine

## 2021-06-13 DIAGNOSIS — J4530 Mild persistent asthma, uncomplicated: Secondary | ICD-10-CM

## 2021-06-13 DIAGNOSIS — J4521 Mild intermittent asthma with (acute) exacerbation: Secondary | ICD-10-CM

## 2021-06-13 DIAGNOSIS — R519 Headache, unspecified: Secondary | ICD-10-CM

## 2021-06-13 DIAGNOSIS — B349 Viral infection, unspecified: Secondary | ICD-10-CM | POA: Diagnosis not present

## 2021-06-13 DIAGNOSIS — R052 Subacute cough: Secondary | ICD-10-CM

## 2021-06-13 LAB — POC INFLUENZA A AND B ANTIGEN (URGENT CARE ONLY)
INFLUENZA A ANTIGEN, POC: NEGATIVE
INFLUENZA B ANTIGEN, POC: NEGATIVE

## 2021-06-13 MED ORDER — OSELTAMIVIR PHOSPHATE 75 MG PO CAPS
75.0000 mg | ORAL_CAPSULE | Freq: Two times a day (BID) | ORAL | 0 refills | Status: DC
Start: 2021-06-13 — End: 2021-07-11

## 2021-06-13 MED ORDER — BENZONATATE 100 MG PO CAPS: 100.0000 mg | ORAL_CAPSULE | Freq: Three times a day (TID) | ORAL | 0 refills | Status: DC | PRN

## 2021-06-13 MED ORDER — PROMETHAZINE-DM 6.25-15 MG/5ML PO SYRP
5.0000 mL | ORAL_SOLUTION | Freq: Every evening | ORAL | 0 refills | Status: DC | PRN
Start: 2021-06-13 — End: 2021-07-11

## 2021-06-13 MED ORDER — ALBUTEROL SULFATE HFA 108 (90 BASE) MCG/ACT IN AERS: 1.0000 | INHALATION_SPRAY | Freq: Four times a day (QID) | RESPIRATORY_TRACT | 3 refills | Status: DC | PRN

## 2021-06-13 MED ORDER — KETOROLAC TROMETHAMINE 30 MG/ML IJ SOLN
30.0000 mg | Freq: Once | INTRAMUSCULAR | Status: AC
  Administered 2021-06-13: 30 mg via INTRAMUSCULAR

## 2021-06-13 MED ORDER — KETOROLAC TROMETHAMINE 30 MG/ML IJ SOLN
INTRAMUSCULAR | Status: AC
  Filled 2021-06-13: qty 1

## 2021-06-13 NOTE — ED Notes (Signed)
Provided blankets 

## 2021-06-13 NOTE — ED Provider Notes (Signed)
[place]   MRN: [id number] DOB: [birth year]  Subjective:   Angelica Hopkins is a 40 y.o. female with pmh of HIV disesase presenting for 1 day history of malaise, fatigue, headaches, chills, cold sweats, coughing, throat pain, sinus congestion.  She is a third grade teacher, has had multiple exposures to influenza at school.  Reports that her CD4 counts are within normal range for her HIV disease.  She also has a history of asthma.  She also has a history of migraines, states that this morning is not as bad but generally does not take medications for her migraines because they have not been helpful.  No current facility-administered medications for this encounter.  Current Outpatient Medications:    bictegravir-emtricitabine-tenofovir AF (BIKTARVY) 50-200-25 MG TABS tablet, TAKE 1 TABLET BY MOUTH 1 TIME A DAY WITH OR WITHOUT FOOD AT THE SAME TIME EACH DAY., Disp: 30 tablet, Rfl: 11   albuterol (VENTOLIN HFA) 108 (90 Base) MCG/ACT inhaler, Inhale 1-2 puffs into the lungs every 6 (six) hours as needed for wheezing or shortness of breath. (Patient not taking: Reported on 06/13/2021), Disp: 18 g, Rfl: 3   fluticasone (FLONASE) 50 MCG/ACT nasal spray, Place 2 sprays into both nostrils daily. (Patient not taking: Reported on 12/27/2020), Disp: 16 mL, Rfl: 0   hydrOXYzine (ATARAX/VISTARIL) 50 MG tablet, Take 1 tablet (50 mg total) by mouth 3 (three) times daily as needed for anxiety. 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   Panic  disorder    Vitamin D deficiency      Past Surgical History:  Procedure Laterality Date   APPENDECTOMY     CESAREAN SECTION     one previous   RADIOACTIVE SEED GUIDED EXCISIONAL BREAST BIOPSY Left 03/04/2019   Procedure: RADIOACTIVE SEED GUIDED EXCISIONAL LEFT BREAST BIOPSY AND EXCISION OF LEFT BREAST MASS;  Surgeon: [name], MD;  Location: [facility];  Service: General;  Laterality: Left;    Family History  Problem Relation Age of Onset   Uterine cancer Maternal Aunt    Lung cancer Maternal Grandmother    Liver disease Mother    HIV Mother    Drug abuse Mother    HIV Father    Diabetes Father    Drug abuse Father    Healthy Brother    Breast cancer Paternal Aunt    Stomach cancer Paternal Grandmother    Healthy Sister    Healthy Brother    Healthy Sister    Healthy Daughter    Colon cancer Neg Hx     Social History   Tobacco Use   Smoking status: Former    Types: Cigarettes    Quit date: 10/14/2017    Years since quitting: 3.6   Smokeless tobacco: Never  Vaping Use   Vaping Use: Former  Substance Use Topics   Alcohol use: Yes    Comment: occ   Drug use: Not  Currently    ROS   Objective:   Vitals: BP 130/82 (BP Location: Left Arm)   Pulse (!) 57   Temp 98.5 F (36.9 C) (Oral)   Resp 18   SpO2 98%   Physical Exam Constitutional:      General: She is not in acute distress.    Appearance: Normal appearance. She is well-developed. She is not ill-appearing, toxic-appearing or diaphoretic.  HENT:     Head: Normocephalic and atraumatic.     Right Ear: Tympanic membrane, ear canal and external ear normal. No drainage or tenderness. No middle ear effusion. Tympanic membrane is not erythematous.     Left Ear: Tympanic membrane, ear canal and external ear normal. No drainage or tenderness.  No middle ear effusion. Tympanic membrane is not erythematous.     Nose: Nose normal. No congestion or rhinorrhea.     Mouth/Throat:     Mouth: Mucous  membranes are moist. No oral lesions.     Pharynx: No pharyngeal swelling, oropharyngeal exudate, posterior oropharyngeal erythema or uvula swelling.     Tonsils: No tonsillar exudate or tonsillar abscesses.  Eyes:     General: No scleral icterus.       Right eye: No discharge.        Left eye: No discharge.     Extraocular Movements: Extraocular movements intact.     Right eye: Normal extraocular motion.     Left eye: Normal extraocular motion.     Conjunctiva/sclera: Conjunctivae normal.     Pupils: Pupils are equal, round, and reactive to light.     Comments: Slight photophobia.  Cardiovascular:     Rate and Rhythm: Normal rate and regular rhythm.     Pulses: Normal pulses.     Heart sounds: Normal heart sounds. No murmur heard.   No friction rub. No gallop.  Pulmonary:     Effort: Pulmonary effort is normal. No respiratory distress.     Breath sounds: Normal breath sounds. No stridor. No wheezing, rhonchi or rales.  Musculoskeletal:     Cervical back: Normal range of motion and neck supple.  Lymphadenopathy:     Cervical: No cervical adenopathy.  Skin:    General: Skin is warm and dry.     Findings: No rash.  Neurological:     General: No focal deficit present.     Mental Status: She is alert and oriented to person, place, and time.     Cranial Nerves: No cranial nerve deficit.     Motor: No weakness.     Coordination: Coordination normal.     Gait: Gait normal.     Deep Tendon Reflexes: Reflexes normal.  Psychiatric:        Mood and Affect: Mood normal.        Behavior: Behavior normal.        Thought Content: Thought content normal.        Judgment: Judgment normal.   IM Toradol in clinic at 30mg .   Results for orders placed or performed during the hospital encounter of 06/13/21 (from the past 24 hour(s))  POC Influenza A & B Ag (Urgent Care)     Status: None   Collection Time: 06/13/21  7:12 PM  Result Value Ref Range   INFLUENZA A ANTIGEN, POC NEGATIVE NEGATIVE    INFLUENZA B ANTIGEN, POC NEGATIVE NEGATIVE    Assessment and Plan :   PDMP not reviewed this encounter.  1. Acute viral syndrome   2. Subacute cough  3. Bad headache   4. Mild intermittent asthma with exacerbation   5. Mild persistent asthma without complication    Deferred imaging given clear cardiopulmonary exam, hemodynamically stable vital signs.  Refilled her albuterol inhaler.  Will cover for influenza with Tamiflu given exposure, symptom set, current incidence in the community.  Use supportive care, rest, fluids, hydration, light meals, schedule Tylenol and ibuprofen. Counseled patient on potential for adverse effects with medications prescribed today, patient verbalized understanding. ER and return-to-clinic precautions discussed, patient verbalized understanding.     Jaynee Eagles, PA-C 06/13/21 1918

## 2021-06-13 NOTE — ED Triage Notes (Signed)
Headache, minimal sore throat congestion in head and minimal cough, chest fullness.  Patient is having chills.

## 2021-06-14 ENCOUNTER — Ambulatory Visit: Payer: BC Managed Care – PPO | Admitting: Infectious Diseases

## 2021-07-10 ENCOUNTER — Ambulatory Visit: Payer: BC Managed Care – PPO | Admitting: Infectious Diseases

## 2021-07-11 ENCOUNTER — Encounter: Payer: Self-pay | Admitting: Infectious Diseases

## 2021-07-11 ENCOUNTER — Ambulatory Visit: Payer: BC Managed Care – PPO | Admitting: Infectious Diseases

## 2021-07-11 ENCOUNTER — Ambulatory Visit (INDEPENDENT_AMBULATORY_CARE_PROVIDER_SITE_OTHER): Payer: BC Managed Care – PPO

## 2021-07-11 ENCOUNTER — Other Ambulatory Visit: Payer: Self-pay

## 2021-07-11 VITALS — BP 109/74 | HR 70 | Wt 129.0 lb

## 2021-07-11 DIAGNOSIS — B181 Chronic viral hepatitis B without delta-agent: Secondary | ICD-10-CM

## 2021-07-11 DIAGNOSIS — Z23 Encounter for immunization: Secondary | ICD-10-CM | POA: Diagnosis not present

## 2021-07-11 DIAGNOSIS — Z21 Asymptomatic human immunodeficiency virus [HIV] infection status: Secondary | ICD-10-CM | POA: Diagnosis not present

## 2021-07-11 DIAGNOSIS — B20 Human immunodeficiency virus [HIV] disease: Secondary | ICD-10-CM | POA: Diagnosis not present

## 2021-07-11 DIAGNOSIS — R87612 Low grade squamous intraepithelial lesion on cytologic smear of cervix (LGSIL): Secondary | ICD-10-CM | POA: Diagnosis not present

## 2021-07-11 NOTE — Progress Notes (Signed)
Name: Daylan Boggess  DOB: [birth year]  MRN: [id number] PCP: Sloan Leiter, MD     Subjective   Brief Narrative:  Zelma Snead is a 40 y.o. female with well controlled HIV, Dx 2005. Deferred treatment with ART x 5 years.  HIV Risk: heterosexual, parents HIV+ (was not perinatally infected) History of OIs: none.   Previous Regimens:  Genvoya 2010 >> suppressed, inconsistent use  Biktarvy 2018 >> suppressed    Genotype:  04/2017 - sensitive   CC:  HIV follow up care Hep B follow up care      HPI:  Completed her master's program!  Continues on biktarvy once daily without concern here.  No changes to her health since LOV with me in May 2022. She is hopeful to have more time to optimize her diet to increase her protein and overall lean body mass.  Would like to get back in touch with Dr. Gordy Councilman office re: possible fibroid removal re: heavy periods.    Review of Systems  Constitutional:  Negative for chills and fever.  HENT:  Negative for tinnitus.   Eyes:  Negative for blurred vision and photophobia.  Respiratory:  Negative for cough and sputum production.   Cardiovascular:  Negative for chest pain.  Gastrointestinal:  Negative for diarrhea, nausea and vomiting.  Genitourinary:  Negative for dysuria.  Skin:  Negative for rash.  Neurological:  Negative for headaches.    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 18 g 3   bictegravir-emtricitabine-tenofovir AF (BIKTARVY) 50-200-25 MG TABS tablet TAKE 1 TABLET BY MOUTH 1 TIME A DAY WITH OR WITHOUT FOOD AT THE SAME TIME EACH DAY. 30 tablet 11   benzonatate (TESSALON) 100 MG capsule Take 1-2 capsules (100-200 mg total) by mouth 3 (three) times daily as needed for cough. (Patient not taking: Reported on 07/11/2021) 60 capsule 0   fluticasone (FLONASE) 50 MCG/ACT nasal spray Place 2 sprays into both nostrils daily. (Patient not taking: Reported on 12/27/2020) 16 mL 0   hydrOXYzine (ATARAX/VISTARIL) 50 MG tablet Take 1 tablet (50 mg total) by mouth 3 (three) times daily as needed for anxiety. (Patient not taking: Reported on 06/13/2021) 90 tablet 1   oseltamivir (TAMIFLU) 75 MG capsule Take 1 capsule (75 mg total) by mouth 2 (two) times daily. (Patient not taking: Reported on 07/11/2021) 10 capsule 0   promethazine-dextromethorphan (PROMETHAZINE-DM) 6.25-15 MG/5ML syrup Take 5 mLs by mouth at bedtime as needed for cough. (Patient not taking: Reported on 07/11/2021)  100 mL 0   No facility-administered medications prior to visit.   Allergies  Allergen Reactions   Hydrocodone-Acetaminophen Itching    Patient reported   Kiwi Extract Other (See Comments)    Numbing    Social History   Socioeconomic History   Marital status: Unknown    Spouse name: Not on file   Number of children: 1   Years of education: Not on file   Highest education level: Bachelor's degree (e.g., BA, AB, BS)  Occupational History   Occupation: Teacher  Tobacco Use   Smoking status: Former    Types: Cigarettes    Quit date: 10/14/2017    Years since quitting: 3.8   Smokeless tobacco: Never  Vaping Use   Vaping Use: Former  Substance and Sexual  Activity   Alcohol use: Yes    Comment: occ   Drug use: Not Currently   Sexual activity: Yes    Partners: Male    Birth control/protection: None  Other Topics Concern   Not on file  Social History Narrative   Not on file   Social Determinants of Health   Financial Resource Strain: Not on file  Food Insecurity: No Food Insecurity   Worried About Charity fundraiser in the Last Year: Never true   [place] in the Last Year: Never true  Transportation Needs: No Transportation Needs   Lack of Transportation (Medical): No   Lack of Transportation (Non-Medical): No  Physical Activity: Not on file  Stress: Not on file  Social Connections: Not on file     OBJECTIVE: Vitals:   07/11/21 1620  BP: 109/74  Pulse: 70  SpO2: 99%  Weight: 129 lb (58.5 kg)   Body mass index is 22.85 kg/m.   Physical Exam Vitals reviewed.  HENT:     Mouth/Throat:     Mouth: No oral lesions.     Dentition: No dental abscesses.  Eyes:     Pupils: Pupils are equal, round, and reactive to light.  Cardiovascular:     Rate and Rhythm: Normal rate and regular rhythm.     Heart sounds: Normal heart sounds. No murmur heard. Pulmonary:     Effort: Pulmonary effort is normal. No respiratory distress.     Breath sounds: Normal breath sounds.  Abdominal:     General: There is no distension.     Palpations: Abdomen is soft.     Tenderness: There is no abdominal tenderness.  Musculoskeletal:        General: No tenderness. Normal range of motion.  Lymphadenopathy:     Cervical: No cervical adenopathy.  Skin:    General: Skin is warm and dry.     Findings: No rash.  Neurological:     Mental Status: She is alert and oriented to person, place, and time.  Psychiatric:        Mood and Affect: Mood and affect normal.        Judgment: Judgment normal.     Comments: In good spirits.      HIV 1 RNA Quant (Copies/mL)  Date Value  07/11/2021 Not Detected  11/30/2020 <20 (H)  04/25/2020 <20    CD4 T Cell Abs (/uL)  Date Value  07/11/2021 1,411  11/30/2020 1,120  12/13/2019 961    Lab Results  Component Value Date   HAV NON-REACTIVE 05/12/2017   HBEAG NON-REACTIVE 07/11/2021   HEPCAB NON-REACTIVE 05/12/2017   Lab Results  Component Value Date   WBC 3.9 (L)  01/28/2020   HGB 12.7 01/28/2020   HCT 38.1 01/28/2020   MCV 89.8 01/28/2020   PLT 192.0 01/28/2020   Lab Results  Component Value Date   CREATININE 0.97 07/11/2021   CREATININE 0.91 11/30/2020   CREATININE 1.02 01/28/2020   Lab Results  Component Value Date   ALT 18 07/11/2021   AST 24 07/11/2021   GGT 16 06/23/2017   ALKPHOS 31 (L) 01/28/2020   BILITOT 0.3 07/11/2021    Problem List Items Addressed This Visit       High   Human immunodeficiency virus (HIV) disease ([place])    Well controlled on once daily biktarvy. Will update pertinent labs today and have her return in 6 months.  She has access to primary care team as well as OBGYN.  COVID bivalent booster today.  RTC in 96m        Unprioritized   Hepatitis B infection    Treated chronically with biktarvy.  Check hep b DNA today, LFTs. U/S screenings to start @ 38 yo.  LFTs have been normal the last few checks and eAg has finally seroconverted.        Relevant Orders   COMPLETE METABOLIC PANEL WITH GFR (Completed)   Hepatitis B DNA, ultraquantitative, PCR (Completed)   Hepatitis B e antigen (Completed)   LGSIL on Pap smear of cervix    Will need repeat testing in July 2023 - can also talk with Dr. Elgie Congo about treatment of fibroids.       Other Visit Diagnoses     Asymptomatic HIV infection ([place])    -  Primary   Relevant Orders   HIV-1 RNA quant-no reflex-bld (Completed)   T-helper cell (CD4)- ([facility] clinic only) (Completed)       Janene Madeira, MSN, NP-C [facility] [email or web address] Pager: [phone] Office: [phone] [place]: [phone]

## 2021-07-11 NOTE — Progress Notes (Signed)
° °  Covid-19 Vaccination Clinic  Name:  Angelica Hopkins    MRN: [id number] DOB: [birth year]  07/11/2021  [name] was observed post Covid-19 immunization for 15 minutes without incident. She was provided with Vaccine Information Sheet and instruction to access the V-Safe system.   [name] was instructed to call 911 with any severe reactions post vaccine: Difficulty breathing  Swelling of face and throat  A fast heartbeat  A bad rash all over body  Dizziness and weakness   Immunizations Administered     Name Date Dose VIS Date Route   Pfizer Covid-19 Vaccine Bivalent Booster 07/11/2021  4:39 PM 0.3 mL 03/28/2021 Intramuscular   Manufacturer: [place]   Lot: [id number]   [place]: [phone]

## 2021-07-11 NOTE — Patient Instructions (Addendum)
Vital Protein Collagen - put a scoop in your coffee or tea to help get more protein.   Continue Biktarvy every day - stop by the lab on your way out.

## 2021-07-12 ENCOUNTER — Encounter: Payer: Self-pay | Admitting: Infectious Diseases

## 2021-07-12 LAB — T-HELPER CELL (CD4) - (RCID CLINIC ONLY)
CD4 % Helper T Cell: 44 % (ref 33–65)
CD4 T Cell Abs: 1411 /uL (ref 400–1790)

## 2021-07-15 LAB — COMPLETE METABOLIC PANEL WITH GFR
AG Ratio: 1.4 (calc) (ref 1.0–2.5)
ALT: 18 U/L (ref 6–29)
AST: 24 U/L (ref 10–30)
Albumin: 4.5 g/dL (ref 3.6–5.1)
Alkaline phosphatase (APISO): 29 U/L — ABNORMAL LOW (ref 31–125)
BUN: 16 mg/dL (ref 7–25)
CO2: 27 mmol/L (ref 20–32)
Calcium: 9.6 mg/dL (ref 8.6–10.2)
Chloride: 103 mmol/L (ref 98–110)
Creat: 0.97 mg/dL (ref 0.50–0.97)
Globulin: 3.2 g/dL (calc) (ref 1.9–3.7)
Glucose, Bld: 96 mg/dL (ref 65–99)
Potassium: 4.3 mmol/L (ref 3.5–5.3)
Sodium: 136 mmol/L (ref 135–146)
Total Bilirubin: 0.3 mg/dL (ref 0.2–1.2)
Total Protein: 7.7 g/dL (ref 6.1–8.1)
eGFR: 77 mL/min/{1.73_m2} (ref >=60)

## 2021-07-15 LAB — HEPATITIS B DNA, ULTRAQUANTITATIVE, PCR
Hepatitis B DNA (Calc): <1 {Log_IU}/mL — ABNORMAL HIGH
Hepatitis B DNA: <10 [IU]/mL — ABNORMAL HIGH

## 2021-07-15 LAB — HIV-1 RNA QUANT-NO REFLEX-BLD
HIV 1 RNA Quant: NOT DETECTED {copies}/mL
HIV-1 RNA Quant, Log: NOT DETECTED {Log_copies}/mL

## 2021-07-15 LAB — HEPATITIS B E ANTIGEN: Hep B E Ag: NONREACTIVE

## 2021-08-28 NOTE — Assessment & Plan Note (Signed)
Well controlled on once daily biktarvy. Will update pertinent labs today and have her return in 6 months.  She has access to primary care team as well as OBGYN.  COVID bivalent booster today.  RTC in 18m

## 2021-08-28 NOTE — Assessment & Plan Note (Signed)
Will need repeat testing in July 2023 - can also talk with Dr. Elgie Congo about treatment of fibroids.

## 2021-08-28 NOTE — Assessment & Plan Note (Signed)
Treated chronically with biktarvy.  Check hep b DNA today, LFTs. U/S screenings to start @ 41 yo.  LFTs have been normal the last few checks and eAg has finally seroconverted.

## 2021-11-12 ENCOUNTER — Telehealth: Payer: Self-pay | Admitting: Lactation Services

## 2021-11-12 NOTE — Telephone Encounter (Signed)
Patient called in requesting a follow up appointment that cannot be scheduled for a few months due to appointment availability.  ? ?Reviewed Colposcopy results at her request. She reports she could not get the results in Mychart.  ? ?Patient reports she is experiencing heavy Menstrual bleeding. She reports she has cycles every 28 days and every 3-4 months, she will have a cycle twice a months. She  reports the cycles are heavy, painful and causing nausea. She will have to change pads every 2 hours. She reports she has had to use 2 super tampons today up until now.  ? ?She feels her bleeding is increasing.  ? ?She reports she has a golf ball sized Fibroid.  ? ?Reviewed with patient that if she is soaking more that 1 pad an hour for more than 2 hours to go to [facility].  ? ?Patient would like to know what to do for the increased bleeding. She reports she is not taking any medication for the bleeding, she reports she has had bleeding issues for a while.  ? ?Will send to Dr. Nehemiah Settle at patients request for evaluation of increased bleeding.  ? ? ?

## 2021-11-13 NOTE — Telephone Encounter (Signed)
Per Dr. Nehemiah Settle, patient needs to be scheduled to see Dr. Elgie Congo to discuss treatment with possible [place].  ? ?Attempted to call patient, did not reach her. LM for her to call the office at her convenience.  ? ?Will send My Chart message.  ?

## 2021-12-03 ENCOUNTER — Other Ambulatory Visit: Payer: Self-pay | Admitting: Infectious Diseases

## 2021-12-03 DIAGNOSIS — B20 Human immunodeficiency virus [HIV] disease: Secondary | ICD-10-CM

## 2021-12-10 ENCOUNTER — Encounter: Payer: Self-pay | Admitting: Obstetrics and Gynecology

## 2021-12-10 ENCOUNTER — Ambulatory Visit (INDEPENDENT_AMBULATORY_CARE_PROVIDER_SITE_OTHER): Payer: BC Managed Care – PPO | Admitting: Obstetrics and Gynecology

## 2021-12-10 DIAGNOSIS — D259 Leiomyoma of uterus, unspecified: Secondary | ICD-10-CM | POA: Insufficient documentation

## 2021-12-10 DIAGNOSIS — N921 Excessive and frequent menstruation with irregular cycle: Secondary | ICD-10-CM | POA: Insufficient documentation

## 2021-12-10 DIAGNOSIS — D251 Intramural leiomyoma of uterus: Secondary | ICD-10-CM

## 2021-12-10 DIAGNOSIS — N882 Stricture and stenosis of cervix uteri: Secondary | ICD-10-CM | POA: Insufficient documentation

## 2021-12-10 DIAGNOSIS — Z3202 Encounter for pregnancy test, result negative: Secondary | ICD-10-CM | POA: Diagnosis not present

## 2021-12-10 DIAGNOSIS — D250 Submucous leiomyoma of uterus: Secondary | ICD-10-CM

## 2021-12-10 LAB — POCT PREGNANCY, URINE: Preg Test, Ur: NEGATIVE

## 2021-12-10 MED ORDER — IBUPROFEN 600 MG PO TABS: 600.0000 mg | ORAL_TABLET | Freq: Four times a day (QID) | ORAL | 2 refills | Status: DC | PRN

## 2021-12-10 MED ORDER — MISOPROSTOL 100 MCG PO TABS: ORAL_TABLET | ORAL | 0 refills | Status: DC

## 2021-12-10 NOTE — Progress Notes (Signed)
ENDOMETRIAL BIOPSY     ? ?Angelica Hopkins is a 41 y.o. G2P1011 here for endometrial biopsy. ? ?The indications for endometrial biopsy were reviewed.  Risks of the biopsy including cramping, bleeding, infection, uterine perforation, inadequate specimen and need for additional procedures were discussed. The patient states she understands and agrees to undergo procedure today. Consent was signed. Time out was performed.  ? ?Indications: metrorrhagia ?Urine HCG: neg ? ?A bivalve speculum was placed into the vagina and the cervix was easily visualized and was prepped with Betadine x2. A single-toothed tenaculum was placed on the anterior lip of the cervix to stabilize it. The 3 mm pipelle was introduced into the cervix and immediately met resistance with increased patient discomfort.  Uterus kept on continued traction and tried entry with other angles.  Uterine cavity was unable to be entered.  The procedure was then discontinued. ? ?Lynnda Shields, MD ?

## 2021-12-10 NOTE — Progress Notes (Signed)
?  CC: irregular bleeding and fibroid ?Subjective:  ? ? Patient ID: Angelica Hopkins, female    DOB: 1980/10/10, 41 y.o.   MRN: [id number] ? ?HPI ?41 yo G2P1 seen for discussion of possible Sonata procedure due to fibroid uterus.  Pt notes her menses have become heavier and she occasionally will have two cycles per month.  Discussed Sonata procedure and gave patient pamphlet/website information. Insurance form filled out today.  See separate noted regarding endometrial biopsy. ? ? ?Review of Systems ? ?   ?Objective:  ? Physical Exam ?Vitals:  ? 12/10/21 0941  ?BP: 110/87  ?Pulse: 73  ? ?CLINICAL DATA:  41 year old female with abnormal menses. ?  ?EXAM: ?TRANSABDOMINAL ULTRASOUND OF PELVIS ?  ?TECHNIQUE: ?Transabdominal ultrasound examination of the pelvis was performed ?including evaluation of the uterus, ovaries, adnexal regions, and ?pelvic cul-de-sac. ?  ?COMPARISON:  None. ?  ?FINDINGS: ?Uterus ?  ?Measurements: 10.8 x 5.0 x 6.0 cm = volume: 170 mL. The uterus is ?anteverted and appears heterogeneous. There is a 6.7 x 4.9 x 6.2 cm ?right posterior body intramural fibroid with possible submucosal ?component. ?  ?Endometrium ?  ?Thickness: 7 mm.  No focal abnormality visualized. ?  ?Right ovary ?  ?Measurements: 3.2 x 1.3 x 2.6 cm = volume: 5.4 mL. Normal ?appearance/no adnexal mass. ?  ?Left ovary ?  ?Measurements: 3.5 x 2.4 x 3.1 cm = volume: 13.7 mL. Normal ?appearance/no adnexal mass. ?  ?Other findings:  No abnormal free fluid. ?  ?IMPRESSION: ?1. Right posterior body intramural fibroid with possible submucosal ?component. ?2. Unremarkable ovaries. ?  ? ? ?   ?Assessment & Plan:  ? ?1. Intramural and submucous leiomyoma of uterus ?Will schedule Sonata procedure pending endometrial biopsy and insurance approval ? ?2. Metrorrhagia ? ? ?3. Cervical stenosis (uterine cervix) ?Will attempt ultrasound guided endometrial biopsy in 3 weeks,after cervical ripening ? ?- misoprostol (CYTOTEC) 100 MCG tablet; 1 tab per  vagina the night before and the morning of the procedure  Dispense: 2 tablet; Refill: 0 ? ?F/u in 3 weeks to retry biopsy  ? ?Griffin Basil, MD ?Faculty Attending, [facility]  ?

## 2021-12-11 ENCOUNTER — Other Ambulatory Visit: Payer: Self-pay | Admitting: Obstetrics and Gynecology

## 2021-12-11 DIAGNOSIS — N882 Stricture and stenosis of cervix uteri: Secondary | ICD-10-CM

## 2022-01-09 ENCOUNTER — Encounter: Payer: Self-pay | Admitting: General Practice

## 2022-01-09 ENCOUNTER — Encounter: Payer: Self-pay | Admitting: Obstetrics and Gynecology

## 2022-01-09 ENCOUNTER — Telehealth: Payer: Self-pay

## 2022-01-09 ENCOUNTER — Encounter: Payer: Self-pay | Admitting: Family Medicine

## 2022-01-09 ENCOUNTER — Ambulatory Visit (INDEPENDENT_AMBULATORY_CARE_PROVIDER_SITE_OTHER): Payer: BC Managed Care – PPO | Admitting: Obstetrics and Gynecology

## 2022-01-09 VITALS — BP 106/72 | HR 66 | Ht 64.0 in | Wt 120.3 lb

## 2022-01-09 DIAGNOSIS — N882 Stricture and stenosis of cervix uteri: Secondary | ICD-10-CM

## 2022-01-09 DIAGNOSIS — N921 Excessive and frequent menstruation with irregular cycle: Secondary | ICD-10-CM

## 2022-01-09 DIAGNOSIS — Z3202 Encounter for pregnancy test, result negative: Secondary | ICD-10-CM | POA: Diagnosis not present

## 2022-01-09 DIAGNOSIS — D250 Submucous leiomyoma of uterus: Secondary | ICD-10-CM

## 2022-01-09 LAB — POCT PREGNANCY, URINE: Preg Test, Ur: NEGATIVE

## 2022-01-09 NOTE — Telephone Encounter (Signed)
Called Pt to inform of [place] scheduled for 01/16/22@ 1:30P and to arrive at 1:15 w/ full bladder, no answer, left VM.

## 2022-01-09 NOTE — Progress Notes (Signed)
ENDOMETRIAL BIOPSY      Angelica Hopkins is a 41 y.o. G2P1011 here for endometrial biopsy.  The indications for endometrial biopsy were reviewed.  Risks of the biopsy including cramping, bleeding, infection, uterine perforation, inadequate specimen and need for additional procedures were discussed. The patient states she understands and agrees to undergo procedure today. Consent was signed. Time out was performed.   Indications: metrorrhagia, fibroid uterus Urine HCG: neg  A bivalve speculum was placed into the vagina and the cervix was easily visualized and was prepped with Betadine x2. Pt had premedicated with misoprostol the night before and the morning of the procedure.  Real time ultrasound was performed and a large fundal fibroid was noted.  Cervical canal was not well visualized.  A single-toothed tenaculum was placed on the anterior lip of the cervix to stabilize it. The 3 mm pipelle was introduced into the cervical canal and met resistance.  Different angle of entry were attempted without success.  Due to patient discomfort the procedure was terminated at this time. The instruments were removed from the patient's vagina. Minimal bleeding from the cervix at the tenaculum was noted.   The patient tolerated the procedure well. Routine post-procedure instructions were given to the patient.    Discussed other treatment options for her fibroid and bleeding including myfembree, UFE and possible vaginal hysterectomy.  Will recheck pelvic ultrasound to see current uterine size.  Gyn virtual visit in 5 weeks.  Will cancel Sonata procedure in august as it would be impossible to pass the probe through the cervix.  Lynnda Shields, MD

## 2022-01-11 ENCOUNTER — Emergency Department (HOSPITAL_COMMUNITY)
Admission: EM | Admit: 2022-01-11 | Discharge: 2022-01-11 | Disposition: A | Discharge status: Home / Self Care | Payer: BC Managed Care – PPO | Attending: Emergency Medicine | Admitting: Emergency Medicine

## 2022-01-11 ENCOUNTER — Emergency Department (HOSPITAL_COMMUNITY): Payer: BC Managed Care – PPO

## 2022-01-11 ENCOUNTER — Encounter: Payer: Self-pay | Admitting: Obstetrics and Gynecology

## 2022-01-11 ENCOUNTER — Encounter (HOSPITAL_COMMUNITY): Payer: Self-pay

## 2022-01-11 DIAGNOSIS — N9489 Other specified conditions associated with female genital organs and menstrual cycle: Secondary | ICD-10-CM | POA: Insufficient documentation

## 2022-01-11 DIAGNOSIS — D259 Leiomyoma of uterus, unspecified: Secondary | ICD-10-CM | POA: Insufficient documentation

## 2022-01-11 DIAGNOSIS — R1032 Left lower quadrant pain: Secondary | ICD-10-CM | POA: Diagnosis present

## 2022-01-11 LAB — CBC WITH DIFFERENTIAL/PLATELET
Abs Immature Granulocytes: 0.02 10*3/uL (ref 0.00–0.07)
Basophils Absolute: 0 10*3/uL (ref 0.0–0.1)
Basophils Relative: 0 %
Eosinophils Absolute: 0 10*3/uL (ref 0.0–0.5)
Eosinophils Relative: 0 %
HCT: 35.8 % — ABNORMAL LOW (ref 36.0–46.0)
Hemoglobin: 12 g/dL (ref 12.0–15.0)
Immature Granulocytes: 0 %
Lymphocytes Relative: 23 %
Lymphs Abs: 1.8 10*3/uL (ref 0.7–4.0)
MCH: 29.8 pg (ref 26.0–34.0)
MCHC: 33.5 g/dL (ref 30.0–36.0)
MCV: 88.8 fL (ref 80.0–100.0)
Monocytes Absolute: 0.5 10*3/uL (ref 0.1–1.0)
Monocytes Relative: 7 %
Neutro Abs: 5.2 10*3/uL (ref 1.7–7.7)
Neutrophils Relative %: 70 %
Platelets: 209 10*3/uL (ref 150–400)
RBC: 4.03 MIL/uL (ref 3.87–5.11)
RDW: 13.2 % (ref 11.5–15.5)
WBC: 7.6 10*3/uL (ref 4.0–10.5)
nRBC: 0 % (ref 0.0–0.2)

## 2022-01-11 LAB — URINALYSIS, ROUTINE W REFLEX MICROSCOPIC
Bilirubin Urine: NEGATIVE
Glucose, UA: NEGATIVE mg/dL
Hgb urine dipstick: NEGATIVE
Ketones, ur: NEGATIVE mg/dL
Leukocytes,Ua: NEGATIVE
Nitrite: NEGATIVE
Protein, ur: NEGATIVE mg/dL
Specific Gravity, Urine: 1.021 (ref 1.005–1.030)
pH: 5 (ref 5.0–8.0)

## 2022-01-11 LAB — COMPREHENSIVE METABOLIC PANEL
ALT: 14 U/L (ref 0–44)
AST: 24 U/L (ref 15–41)
Albumin: 3.6 g/dL (ref 3.5–5.0)
Alkaline Phosphatase: 27 U/L — ABNORMAL LOW (ref 38–126)
Anion gap: 12 (ref 5–15)
BUN: 9 mg/dL (ref 6–20)
CO2: 22 mmol/L (ref 22–32)
Calcium: 9 mg/dL (ref 8.9–10.3)
Chloride: 104 mmol/L (ref 98–111)
Creatinine, Ser: 0.95 mg/dL (ref 0.44–1.00)
GFR, Estimated: >60 mL/min (ref >60)
Glucose, Bld: 94 mg/dL (ref 70–99)
Potassium: 3.6 mmol/L (ref 3.5–5.1)
Sodium: 138 mmol/L (ref 135–145)
Total Bilirubin: 1.2 mg/dL (ref 0.3–1.2)
Total Protein: 6.5 g/dL (ref 6.5–8.1)

## 2022-01-11 LAB — I-STAT BETA HCG BLOOD, ED (MC, WL, AP ONLY): I-stat hCG, quantitative: <5 m[IU]/mL (ref <5)

## 2022-01-11 MED ORDER — OXYCODONE-ACETAMINOPHEN 5-325 MG PO TABS
1.0000 | ORAL_TABLET | Freq: Once | ORAL | Status: AC
  Administered 2022-01-11: 1 via ORAL
  Filled 2022-01-11: qty 1

## 2022-01-11 MED ORDER — OXYCODONE-ACETAMINOPHEN 5-325 MG PO TABS: 1.0000 | ORAL_TABLET | Freq: Three times a day (TID) | ORAL | 0 refills | Status: DC | PRN

## 2022-01-11 MED ORDER — NAPROXEN 250 MG PO TABS
500.0000 mg | ORAL_TABLET | Freq: Once | ORAL | Status: AC
  Administered 2022-01-11: 500 mg via ORAL
  Filled 2022-01-11: qty 2

## 2022-01-11 MED ORDER — IBUPROFEN 600 MG PO TABS: 600.0000 mg | ORAL_TABLET | Freq: Four times a day (QID) | ORAL | 0 refills | Status: DC | PRN

## 2022-01-11 MED ORDER — ONDANSETRON HCL 4 MG/2ML IJ SOLN: 4.0000 mg | Freq: Once | INTRAMUSCULAR | Status: DC

## 2022-01-11 MED ORDER — ONDANSETRON 4 MG PO TBDP
4.0000 mg | ORAL_TABLET | Freq: Once | ORAL | Status: AC
  Administered 2022-01-11: 4 mg via ORAL
  Filled 2022-01-11: qty 1

## 2022-01-11 NOTE — ED Notes (Signed)
Pt unwilling to participate in assessment. States she is in too much pain and for this RN to just read her chart.

## 2022-01-11 NOTE — Discharge Instructions (Addendum)
Take the medicine ordered for pain control, call your Gyne today for close follow up for symptoms management and to review the ultrasound.

## 2022-01-11 NOTE — ED Notes (Signed)
Patient Alert and oriented to baseline. Stable and ambulatory to baseline. Patient verbalized understanding of the discharge instructions.  Patient belongings were taken by the patient.   

## 2022-01-11 NOTE — ED Provider Notes (Signed)
[facility] EMERGENCY DEPARTMENT Provider Note   CSN: [id number] Arrival date & time: 01/11/22  0422     History {Add pertinent medical, surgical, social history, OB history to HPI:1} Chief Complaint  Patient presents with   Abdominal Pain    Angelica Hopkins is a 41 y.o. female.  HPI     41 year old female comes in with chief complaint of abdominal pain.  Patient indicates that she has been suffering with fibroids.  She recently saw her gynecologist and had a procedure attempted to get further information.  That procedure did not succeed as it appears that there was some resistance noted when the gynecologist attempted to introduce instrument into patient's cervix.  Patient was advised that she might have some discomfort for which she will need ibuprofen, but that her symptoms will resolve.  This procedure was attempted on Wednesday.  Yesterday she started noticing some discomfort, it was tolerable.  Last night however the pain started getting more intense and patient decided to come to the ER.  She denies any new vaginal bleeding.   Home Medications Prior to Admission medications   Medication Sig Start Date End Date Taking? Authorizing Provider  albuterol (VENTOLIN HFA) 108 (90 Base) MCG/ACT inhaler Inhale 1-2 puffs into the lungs every 6 (six) hours as needed for wheezing or shortness of breath. 06/13/21   Jaynee Eagles, PA-C  BIKTARVY 50-200-25 MG TABS tablet TAKE 1 TABLET BY MOUTH 1 TIME A DAY. WITH OR WITHOUT FOOD AT THE SAME TIME EACH DAY 12/03/21   [place] Callas, NP  ibuprofen (ADVIL) 600 MG tablet Take 1 tablet (600 mg total) by mouth every 6 (six) hours as needed for headache, mild pain, moderate pain or cramping. 12/10/21   Griffin Basil, MD  misoprostol (CYTOTEC) 100 MCG tablet 1 tab per vagina the night before and the morning of the procedure 12/10/21   Griffin Basil, MD      Allergies    Hydrocodone-acetaminophen and Kiwi extract    Review  of Systems   Review of Systems  All other systems reviewed and are negative.   Physical Exam Updated Vital Signs BP 126/88   Pulse (!) 51   Temp 98.5 F (36.9 C) (Oral)   Resp 11   SpO2 100%  Physical Exam Vitals and nursing note reviewed.  Constitutional:      Appearance: She is well-developed.  HENT:     Head: Atraumatic.  Cardiovascular:     Rate and Rhythm: Normal rate.  Pulmonary:     Effort: Pulmonary effort is normal.  Abdominal:     Tenderness: There is abdominal tenderness. There is no guarding or rebound.     Comments: Lower quadrant abdominal tenderness  Musculoskeletal:     Cervical back: Normal range of motion and neck supple.  Skin:    General: Skin is warm and dry.  Neurological:     Mental Status: She is alert and oriented to person, place, and time.     ED Results / Procedures / Treatments   Labs (all labs ordered are listed, but only abnormal results are displayed) Labs Reviewed  CBC WITH DIFFERENTIAL/PLATELET - Abnormal; Notable for the following components:      Result Value   HCT 35.8 (*)    All other components within normal limits  COMPREHENSIVE METABOLIC PANEL - Abnormal; Notable for the following components:   Alkaline Phosphatase 27 (*)    All other components within normal limits  URINALYSIS, ROUTINE W REFLEX  MICROSCOPIC  I-STAT BETA HCG BLOOD, ED ([facility], [facility], [facility] ONLY)    EKG None  Radiology No results found.  Procedures Procedures  {Document cardiac monitor, telemetry assessment procedure when appropriate:1}  Medications Ordered in ED Medications  oxyCODONE-acetaminophen (PERCOCET/ROXICET) 5-325 MG per tablet 1 tablet (has no administration in time range)  naproxen (NAPROSYN) tablet 500 mg (has no administration in time range)  oxyCODONE-acetaminophen (PERCOCET/ROXICET) 5-325 MG per tablet 1 tablet (1 tablet Oral Given 01/11/22 0455)  ondansetron (ZOFRAN-ODT) disintegrating tablet 4 mg (4 mg Oral Given 01/11/22 0455)    ED  Course/ Medical Decision Making/ A&P                           Medical Decision Making Risk Prescription drug management.   This patient presents to the ED with chief complaint(s) of lower quadrant abdominal pain with pertinent past medical history of fibroids with recent instrumentation in the gynecologist office which further complicates the presenting complaint. The complaint involves an extensive differential diagnosis and also carries with it a high risk of complications and morbidity.    The differential diagnosis includes : Postop pain, uterine spasms, cystitis, soft tissue tear, nerve injury.  The initial plan is to get ultrasound of the pelvis and focus on pain control.  Basic labs were also ordered.   Additional history obtained: Additional history obtained from spouse Records reviewed Primary Care Documents reviewed 989 visit with patient's OB/GYN.  Independent labs interpretation:  The following labs were independently interpreted: normal urine test, normal cbc.   Treatment and Reassessment: Patient was given Percocet arrival.  She feels a little better.  She will let us know when her pain starts getting worse  Consideration for admission or further workup: Social Determinants of health:  Final Clinical Impression(s) / ED Diagnoses Final diagnoses:  None    Rx / DC Orders ED Discharge Orders     None

## 2022-01-11 NOTE — ED Provider Triage Note (Signed)
Emergency Medicine Provider Triage Evaluation Note  Layal Javid , a 41 y.o. female  was evaluated in triage.  Pt complains of abdominal cramping and sever pain after misoprostol for preprocedure prep prior to attempted fibroid biopsy yesterday, failed. nausea  Review of Systems  Positive: Nausea, pelvic pain Negative: V, vaginal bleeding  Physical Exam  BP 140/74 (BP Location: Left Arm)   Pulse 70   Temp 98.5 F (36.9 C) (Oral)   Resp 16   SpO2 100%  Gen:   Awake, tearful, fetal position Resp:  Normal effort  MSK:   Moves extremities without difficulty  Other:  TTP over the bilateral pelvis, suprapubic  Medical Decision Making  Medically screening exam initiated at 4:45 AM.  Appropriate orders placed.  Aissa Lisowski was informed that the remainder of the evaluation will be completed by another provider, this initial triage assessment does not replace that evaluation, and the importance of remaining in the ED until their evaluation is complete.  This chart was dictated using voice recognition software, Dragon. Despite the best efforts of this provider to proofread and correct errors, errors may still occur which can change documentation meaning.    Emeline Darling, PA-C 01/11/22 [phone]

## 2022-01-11 NOTE — ED Triage Notes (Signed)
Pt states that she has a fibroid on her uterus and that she was given misoprostol, abd pain is now getting worse with nausea.

## 2022-01-16 ENCOUNTER — Ambulatory Visit (HOSPITAL_COMMUNITY): Payer: BC Managed Care – PPO

## 2022-02-14 ENCOUNTER — Telehealth (INDEPENDENT_AMBULATORY_CARE_PROVIDER_SITE_OTHER): Payer: BC Managed Care – PPO | Admitting: Obstetrics and Gynecology

## 2022-02-14 ENCOUNTER — Encounter: Payer: Self-pay | Admitting: Obstetrics and Gynecology

## 2022-02-14 DIAGNOSIS — N921 Excessive and frequent menstruation with irregular cycle: Secondary | ICD-10-CM

## 2022-02-14 NOTE — Progress Notes (Signed)
    GYNECOLOGY VIRTUAL VISIT ENCOUNTER NOTE  Provider location: [facility]   Patient location: Home  I connected with Brittanny Levenhagen on 02/14/22 at  2:55 PM EDT by MyChart Video Encounter and verified that I am speaking with the correct person using two identifiers.   I discussed the limitations, risks, security and privacy concerns of performing an evaluation and management service virtually and the availability of in person appointments. I also discussed with the patient that there may be a patient responsible charge related to this service. The patient expressed understanding and agreed to proceed.   History:  Angelica Hopkins is a 41 y.o. G78P1011 female being evaluated today to review ultrasound reports and discuss her definitive treatment. She denies any abnormal vaginal discharge, bleeding, pelvic pain or other concerns.  Pt notes understanding regarding her TVH.  She will need a preop appointment closer to her surgery date.  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   Panic disorder    Vitamin D deficiency    Past Surgical History:  Procedure Laterality Date   APPENDECTOMY     CESAREAN SECTION     one previous   RADIOACTIVE SEED GUIDED EXCISIONAL BREAST BIOPSY Left 03/04/2019   Procedure: RADIOACTIVE SEED GUIDED EXCISIONAL LEFT BREAST BIOPSY AND EXCISION OF LEFT BREAST MASS;  Surgeon: [name], MD;  Location: [facility];  Service: General;  Laterality: Left;   The following portions of the patient's history were reviewed and updated as appropriate: allergies, current medications, past family history, past  medical history, past social history, past surgical history and problem list.   Health Maintenance:  pap 01/31/21 with normal pap. Positive high risk HPV  Review of Systems:  Pertinent items noted in HPI and remainder of comprehensive ROS otherwise negative.  Physical Exam:   General:  Alert, oriented and cooperative. Patient appears to be in no acute distress.  Mental Status: Normal mood and affect. Normal behavior. Normal judgment and thought content.   Respiratory: Normal respiratory effort, no problems with respiration noted  Rest of physical exam deferred due to type of encounter  Labs and Imaging No results found for this or any previous visit (from the past 336 hour(s)). No results found.     Assessment and Plan:    Metrorrhagia:  TVH planned, pt will need current pap smear prior to procedure.      I discussed the assessment and treatment plan with the patient. The patient was provided an opportunity to ask questions and all were answered. The patient agreed with the plan and demonstrated an understanding of the instructions.   The patient was advised to call back or seek an in-person evaluation/go to the ED if the symptoms worsen or if the condition fails to improve as anticipated.  I provided 10 minutes of face-to-face time during this encounter.   Griffin Basil, MD [facility], [place]

## 2022-02-20 ENCOUNTER — Other Ambulatory Visit: Payer: Self-pay

## 2022-02-20 ENCOUNTER — Encounter: Payer: Self-pay | Admitting: Infectious Diseases

## 2022-02-20 ENCOUNTER — Ambulatory Visit: Payer: BC Managed Care – PPO | Admitting: Infectious Diseases

## 2022-02-20 VITALS — BP 96/62 | HR 63 | Temp 98.5°F | Resp 16 | Ht 64.0 in | Wt 121.0 lb

## 2022-02-20 DIAGNOSIS — B20 Human immunodeficiency virus [HIV] disease: Secondary | ICD-10-CM

## 2022-02-20 DIAGNOSIS — B977 Papillomavirus as the cause of diseases classified elsewhere: Secondary | ICD-10-CM | POA: Diagnosis not present

## 2022-02-20 DIAGNOSIS — B181 Chronic viral hepatitis B without delta-agent: Secondary | ICD-10-CM

## 2022-02-20 MED ORDER — BIKTARVY 50-200-25 MG PO TABS: ORAL_TABLET | ORAL | 11 refills | Status: DC

## 2022-02-20 NOTE — Progress Notes (Signed)
Name: Angelica Hopkins  DOB: Jul 20, 1981  MRN: [id number] PCP: Patient, No Pcp Per     Subjective   Brief Narrative:  Angelica Hopkins is a 41 y.o. female with well controlled HIV, Dx 2005. Deferred treatment with ART x 5 years.  HIV Risk: sexual Hep B co-infected  History of OIs: none.   Previous Regimens:  Genvoya 2010 >> suppressed, inconsistent use  Biktarvy 2018 >> suppressed    Genotype:  04/2017 - sensitive     CC: HIV / Hep B follow up     HPI:  Doing well with biktarvy once daily and no concerns with the medication. 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  Has had a horrible time with uterine bleeding from growing fibroid. Planning LAVH sometime in November when she can take 2 weeks off. Planning a pap smear with GYN team prior to for one more cervical screen. While she does not have firm plans to have more children she is struggling a little with the thought of this being a permanent no-option for her. She is looking around for counseling services to help her through this time.   Spent time catching up on new job and other fun trips planned.     Review of Systems  Constitutional:  Negative for chills and fever.  HENT:  Negative for tinnitus.   Eyes:  Negative for blurred vision and photophobia.  Respiratory:  Negative for cough and sputum production.   Cardiovascular:  Negative for chest pain.  Gastrointestinal:  Negative for diarrhea, nausea and vomiting.  Genitourinary:  Negative for dysuria.  Skin:  Negative for rash.  Neurological:  Negative for headaches.     Past Medical History:  Diagnosis Date   Abnormal Pap smear of cervix    LSISL, colposcopy 2012   Asthma    Depression    Hepatitis B infection    hepatitis B   HIV (human immunodeficiency virus infection) ([place])    Migraines    per patient    Neuropathy    Likely r/t HIV   Panic disorder    Vitamin D deficiency     Social History   Tobacco  Use   Smoking status: Former    Types: Cigarettes    Quit date: 10/14/2017    Years since quitting: 4.3   Smokeless tobacco: Never  Vaping Use   Vaping Use: Former  Substance Use Topics   Alcohol use: Yes    Comment: occ   Drug use: Not Currently   Outpatient Medications Prior to Visit  Medication Sig Dispense Refill   albuterol (VENTOLIN HFA) 108 (90 Base) MCG/ACT inhaler Inhale 1-2 puffs into the lungs every 6 (six) hours as needed for wheezing or shortness of breath. (Patient taking differently: Inhale 2-3 puffs into the lungs as needed for wheezing or shortness of breath.) 18 g 3   BIKTARVY 50-200-25 MG TABS tablet TAKE 1 TABLET BY MOUTH 1 TIME A DAY. WITH OR WITHOUT FOOD AT THE SAME TIME EACH DAY 30 tablet 2   ibuprofen (ADVIL) 600 MG tablet Take 1 tablet (600 mg total) by mouth every 6 (six) hours as needed. (Patient not taking: Reported on 02/20/2022) 20 tablet 0   misoprostol (CYTOTEC) 100 MCG tablet 1 tab per vagina the night before and the morning of the procedure (Patient not taking: Reported on 02/14/2022) 2 tablet 0   oxyCODONE-acetaminophen (PERCOCET/ROXICET) 5-325 MG tablet Take 1 tablet by mouth every 8 (eight) hours  as needed for severe pain. (Patient not taking: Reported on 02/20/2022) 6 tablet 0   No facility-administered medications prior to visit.   Allergies  Allergen Reactions   Hydrocodone-Acetaminophen Itching    Patient reported   Kiwi Extract Other (See Comments)    Numbing       OBJECTIVE: Vitals:   02/20/22 1341  BP: 96/62  Pulse: 63  Resp: 16  Temp: 98.5 F (36.9 C)  TempSrc: Oral  SpO2: 100%  Weight: 121 lb (54.9 kg)  Height: '5\' 4"'$  (1.626 m)   Body mass index is 20.77 kg/m.   Physical Exam Constitutional:      Appearance: Normal appearance. She is not ill-appearing.  HENT:     Mouth/Throat:     Mouth: Mucous membranes are moist.     Pharynx: Oropharynx is clear.  Eyes:     General: No scleral icterus. Cardiovascular:     Rate and  Rhythm: Normal rate.  Pulmonary:     Effort: Pulmonary effort is normal.  Neurological:     Mental Status: She is oriented to person, place, and time.  Psychiatric:        Mood and Affect: Mood normal.        Thought Content: Thought content normal.      HIV 1 RNA Quant (Copies/mL)  Date Value  07/11/2021 Not Detected  11/30/2020 <20 (H)  04/25/2020 <20   CD4 T Cell Abs (/uL)  Date Value  07/11/2021 1,411  11/30/2020 1,120  12/13/2019 961    Lab Results  Component Value Date   HAV NON-REACTIVE 05/12/2017   HBEAG NON-REACTIVE 07/11/2021   HEPCAB NON-REACTIVE 05/12/2017   Lab Results  Component Value Date   WBC 7.6 01/11/2022   HGB 12.0 01/11/2022   HCT 35.8 (L) 01/11/2022   MCV 88.8 01/11/2022   PLT 209 01/11/2022   Lab Results  Component Value Date   CREATININE 0.95 01/11/2022   CREATININE 0.97 07/11/2021   CREATININE 0.91 11/30/2020   Lab Results  Component Value Date   ALT 14 01/11/2022   AST 24 01/11/2022   GGT 16 06/23/2017   ALKPHOS 27 (L) 01/11/2022   BILITOT 1.2 01/11/2022    Problem List Items Addressed This Visit       High   Human immunodeficiency virus (HIV) disease ([place]) - Primary    Very well controlled on once daily Biktarvy. No concerns with side effects or adherence to medication. We gave her a copay card to activate through [place] program today to help with costs. She needs to continue TAF regimen given hep b coinfection.   No drug interactions identified.  Recommendations for counseling service provided today.  I have no reservations from ID perspective for upcoming surgery.   RTC in 6-9 moths       Relevant Medications   bictegravir-emtricitabine-tenofovir AF (BIKTARVY) 50-200-25 MG TABS tablet   Other Relevant Orders   HIV 1 RNA quant-no reflex-bld   T-helper cells (CD4) count     Unprioritized   HPV in female    Planning repeat pap with gyn prior to LAVH later this year.  Would recommend a post-procedure baseline vaginal  pap 1 year after procedure.       Relevant Medications   bictegravir-emtricitabine-tenofovir AF (BIKTARVY) 50-200-25 MG TABS tablet   Hepatitis B infection    HBV dna was not detected at last few checks now after e-antigen seroconversion. Will repeat HBsAg, DNA, LFTs, Fibrotest today.  Continue treatment with Biktarvy QD.  Annual  ultrasound after 40 to start       Relevant Medications   bictegravir-emtricitabine-tenofovir AF (BIKTARVY) 50-200-25 MG TABS tablet   Other Relevant Orders   Liver Fibrosis, FibroTest-ActiTest   COMPLETE METABOLIC PANEL WITH GFR   Hepatitis B DNA, ultraquantitative, PCR   Hepatitis B surface antigen   Janene Madeira, MSN, NP-C [email or web address] Pager: [phone] Office: [phone] [place]: [phone]

## 2022-02-20 NOTE — Assessment & Plan Note (Signed)
Planning repeat pap with gyn prior to LAVH later this year.  Would recommend a post-procedure baseline vaginal pap 1 year after procedure.

## 2022-02-20 NOTE — Assessment & Plan Note (Addendum)
Very well controlled on once daily Biktarvy. No concerns with side effects or adherence to medication. We gave her a copay card to activate through [place] program today to help with costs. She needs to continue TAF regimen given hep b coinfection.   No drug interactions identified.  Recommendations for counseling service provided today.  I have no reservations from ID perspective for upcoming surgery.   RTC in 6-9 moths

## 2022-02-20 NOTE — Assessment & Plan Note (Signed)
HBV dna was not detected at last few checks now after e-antigen seroconversion. Will repeat HBsAg, DNA, LFTs, Fibrotest today.  Continue treatment with Biktarvy QD.  Annual ultrasound after 40 to start

## 2022-02-20 NOTE — Patient Instructions (Addendum)
[facility] -  [place] [place] Location [phone]  [place], [place]   Continue your biktarvy everyday - call the number on your copay assistance card to activate it. Once you activate it, call your mail order pharmacy (number is on this sheet) to give them the information on the card to apply it to your prescription    FU again 6-9 months - whatever works for your schedule and preference.

## 2022-02-22 ENCOUNTER — Other Ambulatory Visit: Payer: Self-pay

## 2022-02-22 ENCOUNTER — Telehealth: Payer: BC Managed Care – PPO

## 2022-02-22 LAB — T-HELPER CELLS (CD4) COUNT (NOT AT ARMC)
CD4 % Helper T Cell: 43 % (ref 33–65)
CD4 T Cell Abs: 890 /uL (ref 400–1790)

## 2022-02-24 LAB — LIVER FIBROSIS, FIBROTEST-ACTITEST
ALT: 10 U/L (ref 6–29)
Alpha-2-Macroglobulin: 262 mg/dL (ref 106–279)
Apolipoprotein A1: 161 mg/dL (ref 101–198)
Bilirubin: 0.4 mg/dL (ref 0.2–1.2)
Fibrosis Score: 0.1
GGT: 10 U/L (ref 3–50)
Haptoglobin: 127 mg/dL (ref 43–212)
Necroinflammat ACT Score: 0.02
Reference ID: 4476218

## 2022-02-24 LAB — COMPLETE METABOLIC PANEL WITH GFR
AG Ratio: 1.4 (calc) (ref 1.0–2.5)
ALT: 14 U/L (ref 6–29)
AST: 17 U/L (ref 10–30)
Albumin: 4.4 g/dL (ref 3.6–5.1)
Alkaline phosphatase (APISO): 28 U/L — ABNORMAL LOW (ref 31–125)
BUN: 12 mg/dL (ref 7–25)
CO2: 26 mmol/L (ref 20–32)
Calcium: 9.1 mg/dL (ref 8.6–10.2)
Chloride: 106 mmol/L (ref 98–110)
Creat: 0.83 mg/dL (ref 0.50–0.97)
Globulin: 3.1 g/dL (calc) (ref 1.9–3.7)
Glucose, Bld: 84 mg/dL (ref 65–99)
Potassium: 4.5 mmol/L (ref 3.5–5.3)
Sodium: 139 mmol/L (ref 135–146)
Total Bilirubin: 0.5 mg/dL (ref 0.2–1.2)
Total Protein: 7.5 g/dL (ref 6.1–8.1)
eGFR: 92 mL/min/{1.73_m2} (ref >=60)

## 2022-02-24 LAB — HEPATITIS B DNA, ULTRAQUANTITATIVE, PCR
Hepatitis B DNA: NOT DETECTED IU/mL
Hepatitis B virus DNA: NOT DETECTED Log IU/mL

## 2022-02-24 LAB — HIV-1 RNA QUANT-NO REFLEX-BLD
HIV 1 RNA Quant: NOT DETECTED Copies/mL
HIV-1 RNA Quant, Log: NOT DETECTED Log cps/mL

## 2022-02-24 LAB — HEPATITIS B SURFACE ANTIGEN: Hepatitis B Surface Ag: REACTIVE — AB

## 2022-03-05 ENCOUNTER — Ambulatory Visit: Admit: 2022-03-05 | Payer: BC Managed Care – PPO | Admitting: Obstetrics and Gynecology

## 2022-03-05 SURGERY — RADIOFREQUENCY ABLATION, LEIOMYOMA, UTERUS, TRANSCERVICAL APPROACH, WITH US GUIDANCE
Anesthesia: Choice

## 2022-03-07 ENCOUNTER — Telehealth: Payer: Self-pay

## 2022-03-07 NOTE — Telephone Encounter (Signed)
Called patient to discuss potential surgery dates, no answer, unable to leave voicemail (voicemail box is full), will schedule on first available 11/8

## 2022-04-14 ENCOUNTER — Encounter: Payer: Self-pay | Admitting: Radiology

## 2022-04-23 ENCOUNTER — Ambulatory Visit: Admit: 2022-04-23 | Payer: BC Managed Care – PPO | Admitting: Obstetrics and Gynecology

## 2022-04-23 SURGERY — HYSTERECTOMY, VAGINAL
Anesthesia: Choice

## 2022-04-29 ENCOUNTER — Ambulatory Visit (INDEPENDENT_AMBULATORY_CARE_PROVIDER_SITE_OTHER): Payer: BC Managed Care – PPO | Admitting: Obstetrics and Gynecology

## 2022-04-29 ENCOUNTER — Other Ambulatory Visit (HOSPITAL_COMMUNITY)
Admission: RE | Admit: 2022-04-29 | Discharge: 2022-04-29 | Disposition: A | Discharge status: Home / Self Care | Payer: BC Managed Care – PPO | Source: Ambulatory Visit | Attending: Obstetrics and Gynecology | Admitting: Obstetrics and Gynecology

## 2022-04-29 ENCOUNTER — Encounter: Payer: Self-pay | Admitting: Obstetrics and Gynecology

## 2022-04-29 VITALS — BP 107/77 | HR 69 | Ht 64.0 in | Wt 117.7 lb

## 2022-04-29 DIAGNOSIS — R87612 Low grade squamous intraepithelial lesion on cytologic smear of cervix (LGSIL): Secondary | ICD-10-CM

## 2022-04-29 NOTE — Progress Notes (Signed)
[name] presents for repeat pap smear 6/22 normal cytology, HPV + 6/21 LGSIL, HPV + Colpo 7/22, no cervical Bx obtained, ECC, HPV +  Pt with H/O HIV and followed by ID  PE AF VSS Chaperone present Lungs clear Heart RRR Abd soft + BS GU Nl EGBUS, pap smear obtained  A/P Abnormal pap smear  Repeat pap smear today. Scheduled for TVH with Dr Elgie Congo. F/U and surgery as per Dr. Elgie Congo

## 2022-05-03 LAB — CYTOLOGY - PAP
Comment: NEGATIVE
Diagnosis: REACTIVE
High risk HPV: NEGATIVE

## 2022-05-24 ENCOUNTER — Ambulatory Visit: Payer: BC Managed Care – PPO | Admitting: Obstetrics and Gynecology

## 2022-05-27 ENCOUNTER — Encounter (HOSPITAL_BASED_OUTPATIENT_CLINIC_OR_DEPARTMENT_OTHER): Payer: Self-pay | Admitting: Obstetrics and Gynecology

## 2022-05-28 ENCOUNTER — Encounter (HOSPITAL_BASED_OUTPATIENT_CLINIC_OR_DEPARTMENT_OTHER): Payer: Self-pay | Admitting: Obstetrics and Gynecology

## 2022-05-28 ENCOUNTER — Other Ambulatory Visit: Payer: Self-pay

## 2022-05-28 NOTE — Progress Notes (Signed)
Your procedure is scheduled on Wednesday, 06/05/2022.  Report to [facility]   Call this number if you have problems the morning of surgery  [phone].   OUR ADDRESS IS [address].  WE ARE LOCATED IN [facility].  PLEASE BRING YOUR INSURANCE CARD AND PHOTO ID DAY OF SURGERY.  ONLY 2 PEOPLE ARE ALLOWED IN  WAITING  ROOM.                                      REMEMBER:  DO NOT EAT FOOD, CANDY GUM OR MINTS  AFTER MIDNIGHT THE NIGHT BEFORE YOUR SURGERY . YOU MAY HAVE CLEAR LIQUIDS FROM MIDNIGHT THE NIGHT BEFORE YOUR SURGERY UNTIL  5:30 AM. NO CLEAR LIQUIDS AFTER   5:30 AM DAY OF SURGERY.  YOU MAY  BRUSH YOUR TEETH MORNING OF SURGERY AND RINSE YOUR MOUTH OUT, NO CHEWING GUM CANDY OR MINTS.     CLEAR LIQUID DIET   Foods Allowed                                                                     Foods Excluded  Coffee and tea, regular and decaf                             liquids that you cannot  Plain Jell-O                                                                   see through such as: Fruit ices (not with fruit pulp)                                     milk, soups, orange juice  Plain  Popsicles                                    All solid food Carbonated beverages, regular and diet                                    Cranberry, grape and apple juices Sports drinks like Gatorade _____________________________________________________________________     TAKE ONLY THESE MEDICATIONS MORNING OF SURGERY: Albuterol Inhaler, Biktarvy Please bring your albuterol inhaler with you on the day of surgery.    UP TO 4 VISITORS  MAY VISIT IN THE EXTENDED RECOVERY ROOM UNTIL 800 PM ONLY.  ONE  VISITOR AGE 36 AND OVER MAY SPEND THE NIGHT AND MUST BE IN EXTENDED RECOVERY ROOM NO LATER THAN 800 PM . YOUR DISCHARGE TIME AFTER YOU SPEND THE NIGHT IS 900 AM THE MORNING AFTER YOUR SURGERY.  YOU MAY PACK A SMALL OVERNIGHT BAG WITH  TOILETRIES FOR YOUR OVERNIGHT STAY IF YOU WISH.  YOUR PRESCRIPTION MEDICATIONS WILL BE PROVIDED DURING [place].                                      DO NOT WEAR JEWERLY, MAKE UP. DO NOT WEAR LOTIONS, POWDERS, PERFUMES OR NAIL POLISH ON YOUR FINGERNAILS. TOENAIL POLISH IS OK TO WEAR. DO NOT SHAVE FOR 48 HOURS PRIOR TO DAY OF SURGERY. MEN MAY SHAVE FACE AND NECK. CONTACTS, GLASSES, OR DENTURES MAY NOT BE WORN TO SURGERY.  REMEMBER: NO SMOKING, DRUGS OR ALCOHOL FOR 24 HOURS BEFORE YOUR SURGERY.                                    [facility] IS NOT RESPONSIBLE  FOR ANY BELONGINGS.                                                                    Marland Kitchen           [facility] - Preparing for Surgery Before surgery, you can play an important role.  Because skin is not sterile, your skin needs to be as free of germs as possible.  You can reduce the number of germs on your skin by washing with CHG (chlorahexidine gluconate) soap before surgery.  CHG is an antiseptic cleaner which kills germs and bonds with the skin to continue killing germs even after washing. Please DO NOT use if you have an allergy to CHG or antibacterial soaps.  If your skin becomes reddened/irritated stop using the CHG and inform your nurse when you arrive at Short Stay. Do not shave (including legs and underarms) for at least 48 hours prior to the first CHG shower.  You may shave your face/neck. Please follow these instructions carefully:  1.  Shower with CHG Soap the night before surgery and the  morning of Surgery.  2.  If you choose to wash your hair, wash your hair first as usual with your  normal  shampoo.  3.  After you shampoo, rinse your hair and body thoroughly to remove the  shampoo.                                        4.  Use CHG as you would any other liquid soap.  You can apply chg directly  to the skin and wash , chg soap provided, night before and morning of your surgery.  5.  Apply the CHG Soap to your  body ONLY FROM THE NECK DOWN.   Do not use on face/ open                           Wound or open sores. Avoid contact with eyes, ears mouth and genitals (private parts).  Wash face,  Genitals (private parts) with your normal soap.             6.  Wash thoroughly, paying special attention to the area where your surgery  will be performed.  7.  Thoroughly rinse your body with warm water from the neck down.  8.  DO NOT shower/wash with your normal soap after using and rinsing off  the CHG Soap.             9.  Pat yourself dry with a clean towel.            10.  Wear clean pajamas.            11.  Place clean sheets on your bed the night of your first shower and do not  sleep with pets. Day of Surgery : Do not apply any lotions/deodorants the morning of surgery.  Please wear clean clothes to the hospital/surgery center.  IF YOU HAVE ANY SKIN IRRITATION OR PROBLEMS WITH THE SURGICAL SOAP, PLEASE GET A BAR OF GOLD DIAL SOAP AND SHOWER THE NIGHT BEFORE YOUR SURGERY AND THE MORNING OF YOUR SURGERY. PLEASE LET THE NURSE KNOW MORNING OF YOUR SURGERY IF YOU HAD ANY PROBLEMS WITH THE SURGICAL SOAP.   ________________________________________________________________________                                                        QUESTIONS [facility] PRE OP NURSE PHONE [phone].

## 2022-05-28 NOTE — Progress Notes (Signed)
Spoke w/ via phone for pre-op interview---[facility] needs dos---- urine pregnancy per anesthesia, surgeon orders pending as of 05/28/22              Lab results------05/31/22 lab appt for cbc, type & screen COVID test -----patient states asymptomatic no test needed Arrive at -------0630 on Wednesday, 06/05/2022 NPO after MN NO Solid Food.  Clear liquids from MN until---0530 Med rec completed Medications to take morning of surgery -----Albuterol inhaler, Biktarvy Diabetic medication -----n/a Patient instructed no nail polish to be worn day of surgery Patient instructed to bring photo id and insurance card day of surgery Patient aware to have Driver (ride ) / caregiver    for 24 hours after surgery - husband, Broadus John Patient Special Instructions -----Extended / overnight stay instructions given. Pre-Op special Istructions -----Requested orders from Dr. Elgie Congo on 05/28/22 via Epic IB. Patient verbalized understanding of instructions that were given at this phone interview. Patient denies shortness of breath, chest pain, fever, cough at this phone interview.  FYI: Patient is HIV+ & Hep B+.

## 2022-05-30 ENCOUNTER — Ambulatory Visit (INDEPENDENT_AMBULATORY_CARE_PROVIDER_SITE_OTHER): Payer: BC Managed Care – PPO | Admitting: Obstetrics and Gynecology

## 2022-05-30 ENCOUNTER — Encounter: Payer: Self-pay | Admitting: Obstetrics and Gynecology

## 2022-05-30 ENCOUNTER — Other Ambulatory Visit: Payer: Self-pay

## 2022-05-30 VITALS — BP 112/75 | HR 61 | Ht 64.0 in | Wt 123.5 lb

## 2022-05-30 DIAGNOSIS — Z01818 Encounter for other preprocedural examination: Secondary | ICD-10-CM | POA: Diagnosis not present

## 2022-05-30 NOTE — H&P (View-Only) (Signed)
OB/GYN Pre-Op History and Physical  Angelica Hopkins is a 41 y.o. [id number] presenting for preoperative appointment. She will be having a total vaginal hysterectomy with bilateral salpingectomy if possible. Pt understands risks and benefits of the procedure including bleeding, infection, involvement of other organs especially bladder and bowel.  Post op care discussed in detail.      Past Medical History:  Diagnosis Date   Abnormal Pap smear of cervix    LSISL, colposcopy 2012   Asthma    inhaler prescribed by PCP, pt states occasional SOB due to asthma   Cellulitis and abscess of buttock 10/22/2019   treated w/ incision and drainage   Depression    no current meds as of 05/28/22   Fibroids    uterine fibroids   Hepatitis B infection    Follows with [facility] for infectious disease, LOV 02/20/22 as of 05/27/22.   HIV (human immunodeficiency virus infection) ([place]) 2005   Follows w/ [facility] for Infectious disease, LOV 02/20/22 as of 05/27/22.   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 HIV, worse in winter, only in fingertips   Panic disorder    hx of anxiety, pt states that sometimes her chest hurts when she gets very anxious (05/28/22)   Vitamin D deficiency    Wears glasses     Past Surgical History:  Procedure Laterality Date   APPENDECTOMY     around [place]  2003   one previous   RADIOACTIVE SEED GUIDED EXCISIONAL BREAST BIOPSY Left 03/04/2019   Procedure: RADIOACTIVE SEED GUIDED EXCISIONAL LEFT BREAST BIOPSY AND EXCISION OF LEFT BREAST MASS;  Surgeon: [name], MD;  Location: [facility];  Service: General;  Laterality: Left;    OB History  Gravida Para Term Preterm AB Living  '2 1 1 '$ 0 1 1  SAB IAB Ectopic Multiple Live Births  1 0 0 0 1    # Outcome Date GA Lbr Len/2nd Weight Sex Delivery Anes PTL Lv  2 Term 11/17/01 [redacted]w[redacted]d 7 lb 9 oz (3.43 kg) F CS-LTranv  Y LIV  1 SAB              Social History   Socioeconomic History   Marital status: Married    Spouse name: Not on file   Number of children: 1   Years of education: Not on file   Highest education level: Master's degree (e.g., MA, MS, MEng, MEd, MSW, MBA)  Occupational History   Occupation: Teacher  Tobacco Use   Smoking status: Every Day    Packs/day: 0.33    Years: 15.00    Total pack years: 4.95    Types: Cigarettes   Smokeless tobacco: Never   Tobacco comments:    Patient quit smoking for a couple of years and then started back.  Vaping Use   Vaping Use: Former   Substances: Nicotine, THC, CBD, Mixture of cannabinoids  Substance and Sexual Activity   Alcohol use: Yes    Alcohol/week: 10.0 standard drinks of alcohol    Types: 10 Cans of beer per week   Drug use: Not Currently   Sexual activity: Yes    Partners: Male    Birth control/protection: None  Other Topics Concern   Not on file  Social History Narrative   Not on file   Social Determinants of Health   Financial Resource Strain: Low Risk  (07/18/2017)  Overall Financial Resource Strain (CARDIA)    Difficulty of Paying Living Expenses: Not very hard  Food Insecurity: No Food Insecurity (12/10/2021)   Hunger Vital Sign    Worried About Running Out of Food in the Last Year: Never true    Ran Out of Food in the Last Year: Never true  Transportation Needs: No Transportation Needs (12/10/2021)   PRAPARE - Hydrologist (Medical): No    Lack of Transportation (Non-Medical): No  Physical Activity: Not on file  Stress: Not on file  Social Connections: Not on file    Family History  Problem Relation Age of Onset   Uterine cancer Maternal Aunt    Lung cancer Maternal Grandmother    Liver disease Mother    HIV Mother    Drug abuse Mother    HIV Father    Diabetes Father    Drug abuse Father    Healthy Brother    Breast cancer Paternal Aunt    Stomach cancer Paternal Grandmother    Healthy Sister     Healthy Brother    Healthy Sister    Healthy Daughter    Colon cancer Neg Hx     (Not in a hospital admission)   Allergies  Allergen Reactions   Hydrocodone-Acetaminophen Itching    Patient reported   Kiwi Extract Other (See Comments)    Tongue gets numb and tingling.    Review of Systems: Negative except for what is mentioned in HPI.     Physical Exam: BP 112/75   Pulse 61   Ht '5\' 4"'$  (1.626 m)   Wt 123 lb 8 oz (56 kg)   LMP 05/30/2022 Comment: starting spotting  BMI 21.20 kg/m  CONSTITUTIONAL: Well-developed, well-nourished female in no acute distress.  HENT:  Normocephalic, atraumatic, External right and left ear normal. Oropharynx is clear and moist EYES: Conjunctivae and EOM are normal.  NECK: Normal range of motion, supple, no masses SKIN: Skin is warm and dry. No rash noted. Not diaphoretic. No erythema. No pallor. [place]: Alert and oriented to person, place, and time. Normal reflexes, muscle tone coordination. No cranial nerve deficit noted. PSYCHIATRIC: Normal mood and affect. Normal behavior. Normal judgment and thought content. CARDIOVASCULAR: Normal heart rate noted, regular rhythm RESPIRATORY: Effort and breath sounds normal, no problems with respiration noted ABDOMEN: Soft, nontender, nondistended,well-healed Pfannenstiel incision. PELVIC: slightly enlarged, mobile, irregular contoured uterus MUSCULOSKELETAL: Normal range of motion. No edema and no tenderness. 2+ distal pulses.   Pertinent Labs/Studies:   CLINICAL DATA:  [id number] bilateral pelvic pain and cramping   EXAM: TRANSABDOMINAL AND TRANSVAGINAL ULTRASOUND OF PELVIS   DOPPLER ULTRASOUND OF OVARIES   TECHNIQUE: Both transabdominal and transvaginal ultrasound examinations of the pelvis were performed. Transabdominal technique was performed for global imaging of the pelvis including uterus, ovaries, adnexal regions, and pelvic cul-de-sac.   It was necessary to proceed with endovaginal  exam following the transabdominal exam to visualize the endometrium and the uterine fibroid. Color and duplex Doppler ultrasound was utilized to evaluate blood flow to the ovaries.   COMPARISON:  February 11, 2020   FINDINGS: Uterus   Measurements: 13.9 x 7.1 x 10.3 = volume: 536.7 mL. There is a large uterine fibroid measuring 8.5 x 7.6 x 6.7 cm at the right posterior fundus/body of the uterus in comparison to 6.7 x 4.9 x 6.2 cm in the previous study.   Endometrium   Thickness: 1.3 cm.  No focal abnormality visualized.   Right ovary  Measurements: 3.4 x 2.2 x 1.8 cm = volume: 9.6 mL. Normal appearance/no adnexal mass. Right ovary seen only by the transabdominal ultrasound.   Left ovary   Measurements: 3.7 x 1.8 x 2.3 cm = volume: 8.2 mL. Normal appearance/no adnexal mass.   Pulsed Doppler evaluation of both ovaries demonstrates normal low-resistance arterial and venous waveforms.   Other findings   Nabothian cyst in the cervix   IMPRESSION: Uterus is enlarged with a large uterine fibroid measuring 8.5 x 7.6 x 6.7 cm at the right posterior fundus/body of the uterus in comparison to 6.7 x 4.9 x 6.2 cm on the previous study.   Suggest uterine fibroid embolization in the appropriate clinical setting.   Endometrial stripe measures 1.3 cm and is at the upper limits of normal.         Assessment and Plan :Angelica Hopkins is a 41 y.o. G2P1011 here for preoperative evaluation.   Plan for [facility] with bilateral salpingectomy NPO Admission labs ordered VS Q4 Discussed likely need for morcellation due to enlarged fibroid uterus.   Lynnda Shields, M.D. Attending [place], [facility] for [employer], [place]

## 2022-05-30 NOTE — Progress Notes (Signed)
OB/GYN Pre-Op History and Physical  Angelica Hopkins is a 41 y.o. [id number] presenting for preoperative appointment. She will be having a total vaginal hysterectomy with bilateral salpingectomy if possible. Pt understands risks and benefits of the procedure including bleeding, infection, involvement of other organs especially bladder and bowel.  Post op care discussed in detail.      Past Medical History:  Diagnosis Date   Abnormal Pap smear of cervix    LSISL, colposcopy 2012   Asthma    inhaler prescribed by PCP, pt states occasional SOB due to asthma   Cellulitis and abscess of buttock 10/22/2019   treated w/ incision and drainage   Depression    no current meds as of 05/28/22   Fibroids    uterine fibroids   Hepatitis B infection    Follows with [facility] for infectious disease, LOV 02/20/22 as of 05/27/22.   HIV (human immunodeficiency virus infection) ([place]) 2005   Follows w/ [facility] for Infectious disease, LOV 02/20/22 as of 05/27/22.   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 HIV, worse in winter, only in fingertips   Panic disorder    hx of anxiety, pt states that sometimes her chest hurts when she gets very anxious (05/28/22)   Vitamin D deficiency    Wears glasses     Past Surgical History:  Procedure Laterality Date   APPENDECTOMY     around [place]  2003   one previous   RADIOACTIVE SEED GUIDED EXCISIONAL BREAST BIOPSY Left 03/04/2019   Procedure: RADIOACTIVE SEED GUIDED EXCISIONAL LEFT BREAST BIOPSY AND EXCISION OF LEFT BREAST MASS;  Surgeon: [name], MD;  Location: [facility];  Service: General;  Laterality: Left;    OB History  Gravida Para Term Preterm AB Living  '2 1 1 '$ 0 1 1  SAB IAB Ectopic Multiple Live Births  1 0 0 0 1    # Outcome Date GA Lbr Len/2nd Weight Sex Delivery Anes PTL Lv  2 Term 11/17/01 [redacted]w[redacted]d 7 lb 9 oz (3.43 kg) F CS-LTranv  Y LIV  1 SAB              Social History   Socioeconomic History   Marital status: Married    Spouse name: Not on file   Number of children: 1   Years of education: Not on file   Highest education level: Master's degree (e.g., MA, MS, MEng, MEd, MSW, MBA)  Occupational History   Occupation: Teacher  Tobacco Use   Smoking status: Every Day    Packs/day: 0.33    Years: 15.00    Total pack years: 4.95    Types: Cigarettes   Smokeless tobacco: Never   Tobacco comments:    Patient quit smoking for a couple of years and then started back.  Vaping Use   Vaping Use: Former   Substances: Nicotine, THC, CBD, Mixture of cannabinoids  Substance and Sexual Activity   Alcohol use: Yes    Alcohol/week: 10.0 standard drinks of alcohol    Types: 10 Cans of beer per week   Drug use: Not Currently   Sexual activity: Yes    Partners: Male    Birth control/protection: None  Other Topics Concern   Not on file  Social History Narrative   Not on file   Social Determinants of Health   Financial Resource Strain: Low Risk  (07/18/2017)  Overall Financial Resource Strain (CARDIA)    Difficulty of Paying Living Expenses: Not very hard  Food Insecurity: No Food Insecurity (12/10/2021)   Hunger Vital Sign    Worried About Running Out of Food in the Last Year: Never true    Ran Out of Food in the Last Year: Never true  Transportation Needs: No Transportation Needs (12/10/2021)   PRAPARE - Hydrologist (Medical): No    Lack of Transportation (Non-Medical): No  Physical Activity: Not on file  Stress: Not on file  Social Connections: Not on file    Family History  Problem Relation Age of Onset   Uterine cancer Maternal Aunt    Lung cancer Maternal Grandmother    Liver disease Mother    HIV Mother    Drug abuse Mother    HIV Father    Diabetes Father    Drug abuse Father    Healthy Brother    Breast cancer Paternal Aunt    Stomach cancer Paternal Grandmother    Healthy Sister     Healthy Brother    Healthy Sister    Healthy Daughter    Colon cancer Neg Hx     (Not in a hospital admission)   Allergies  Allergen Reactions   Hydrocodone-Acetaminophen Itching    Patient reported   Kiwi Extract Other (See Comments)    Tongue gets numb and tingling.    Review of Systems: Negative except for what is mentioned in HPI.     Physical Exam: BP 112/75   Pulse 61   Ht '5\' 4"'$  (1.626 m)   Wt 123 lb 8 oz (56 kg)   LMP 05/30/2022 Comment: starting spotting  BMI 21.20 kg/m  CONSTITUTIONAL: Well-developed, well-nourished female in no acute distress.  HENT:  Normocephalic, atraumatic, External right and left ear normal. Oropharynx is clear and moist EYES: Conjunctivae and EOM are normal.  NECK: Normal range of motion, supple, no masses SKIN: Skin is warm and dry. No rash noted. Not diaphoretic. No erythema. No pallor. [place]: Alert and oriented to person, place, and time. Normal reflexes, muscle tone coordination. No cranial nerve deficit noted. PSYCHIATRIC: Normal mood and affect. Normal behavior. Normal judgment and thought content. CARDIOVASCULAR: Normal heart rate noted, regular rhythm RESPIRATORY: Effort and breath sounds normal, no problems with respiration noted ABDOMEN: Soft, nontender, nondistended,well-healed Pfannenstiel incision. PELVIC: slightly enlarged, mobile, irregular contoured uterus MUSCULOSKELETAL: Normal range of motion. No edema and no tenderness. 2+ distal pulses.   Pertinent Labs/Studies:   CLINICAL DATA:  [id number] bilateral pelvic pain and cramping   EXAM: TRANSABDOMINAL AND TRANSVAGINAL ULTRASOUND OF PELVIS   DOPPLER ULTRASOUND OF OVARIES   TECHNIQUE: Both transabdominal and transvaginal ultrasound examinations of the pelvis were performed. Transabdominal technique was performed for global imaging of the pelvis including uterus, ovaries, adnexal regions, and pelvic cul-de-sac.   It was necessary to proceed with endovaginal  exam following the transabdominal exam to visualize the endometrium and the uterine fibroid. Color and duplex Doppler ultrasound was utilized to evaluate blood flow to the ovaries.   COMPARISON:  February 11, 2020   FINDINGS: Uterus   Measurements: 13.9 x 7.1 x 10.3 = volume: 536.7 mL. There is a large uterine fibroid measuring 8.5 x 7.6 x 6.7 cm at the right posterior fundus/body of the uterus in comparison to 6.7 x 4.9 x 6.2 cm in the previous study.   Endometrium   Thickness: 1.3 cm.  No focal abnormality visualized.   Right ovary  Measurements: 3.4 x 2.2 x 1.8 cm = volume: 9.6 mL. Normal appearance/no adnexal mass. Right ovary seen only by the transabdominal ultrasound.   Left ovary   Measurements: 3.7 x 1.8 x 2.3 cm = volume: 8.2 mL. Normal appearance/no adnexal mass.   Pulsed Doppler evaluation of both ovaries demonstrates normal low-resistance arterial and venous waveforms.   Other findings   Nabothian cyst in the cervix   IMPRESSION: Uterus is enlarged with a large uterine fibroid measuring 8.5 x 7.6 x 6.7 cm at the right posterior fundus/body of the uterus in comparison to 6.7 x 4.9 x 6.2 cm on the previous study.   Suggest uterine fibroid embolization in the appropriate clinical setting.   Endometrial stripe measures 1.3 cm and is at the upper limits of normal.         Assessment and Plan :Angelica Hopkins is a 41 y.o. G2P1011 here for preoperative evaluation.   Plan for [facility] with bilateral salpingectomy NPO Admission labs ordered VS Q4 Discussed likely need for morcellation due to enlarged fibroid uterus.   Lynnda Shields, M.D. Attending [place], [facility] for [employer], [place]

## 2022-05-31 ENCOUNTER — Encounter (HOSPITAL_COMMUNITY)
Admission: RE | Admit: 2022-05-31 | Discharge: 2022-05-31 | Disposition: A | Discharge status: Home / Self Care | Payer: BC Managed Care – PPO | Source: Ambulatory Visit | Attending: Obstetrics and Gynecology | Admitting: Obstetrics and Gynecology

## 2022-05-31 DIAGNOSIS — Z01818 Encounter for other preprocedural examination: Secondary | ICD-10-CM | POA: Diagnosis present

## 2022-05-31 LAB — CBC
HCT: 37.1 % (ref 36.0–46.0)
Hemoglobin: 12.2 g/dL (ref 12.0–15.0)
MCH: 29 pg (ref 26.0–34.0)
MCHC: 32.9 g/dL (ref 30.0–36.0)
MCV: 88.3 fL (ref 80.0–100.0)
Platelets: 204 10*3/uL (ref 150–400)
RBC: 4.2 MIL/uL (ref 3.87–5.11)
RDW: 12.7 % (ref 11.5–15.5)
WBC: 6.1 10*3/uL (ref 4.0–10.5)
nRBC: 0 % (ref 0.0–0.2)

## 2022-06-04 NOTE — Anesthesia Preprocedure Evaluation (Signed)
Anesthesia Evaluation  Patient identified by MRN, date of birth, ID band Patient awake  General Assessment Comment:History noted Dr. Nyoka Cowden  Reviewed: Allergy & Precautions, NPO status , Patient's Chart, lab work & pertinent test results  Airway Mallampati: I       Dental   Pulmonary asthma , COPD,  COPD inhaler, Current Smoker and Patient abstained from smoking.   breath sounds clear to auscultation       Cardiovascular negative cardio ROS  Rhythm:Regular Rate:Normal     Neuro/Psych  Headaches PSYCHIATRIC DISORDERS         GI/Hepatic negative GI ROS,,,(+) Hepatitis -  Endo/Other  negative endocrine ROS    Renal/GU negative Renal ROS     Musculoskeletal   Abdominal   Peds  Hematology  (+) HIV  Anesthesia Other Findings   Reproductive/Obstetrics                             Anesthesia Physical Anesthesia Plan  ASA: 3  Anesthesia Plan: General   Post-op Pain Management: Tylenol PO (pre-op)*   Induction: Intravenous  PONV Risk Score and Plan: 2 and Ondansetron and Dexamethasone  Airway Management Planned: Oral ETT  Additional Equipment: None  Intra-op Plan:   Post-operative Plan: Extubation in OR  Informed Consent:      Dental advisory given  Plan Discussed with: CRNA and Anesthesiologist  Anesthesia Plan Comments:         Anesthesia Quick Evaluation

## 2022-06-05 ENCOUNTER — Encounter (HOSPITAL_BASED_OUTPATIENT_CLINIC_OR_DEPARTMENT_OTHER): Payer: Self-pay | Admitting: Obstetrics and Gynecology

## 2022-06-05 ENCOUNTER — Other Ambulatory Visit: Payer: Self-pay

## 2022-06-05 ENCOUNTER — Observation Stay (HOSPITAL_BASED_OUTPATIENT_CLINIC_OR_DEPARTMENT_OTHER): Payer: BC Managed Care – PPO | Admitting: Anesthesiology

## 2022-06-05 ENCOUNTER — Encounter (HOSPITAL_BASED_OUTPATIENT_CLINIC_OR_DEPARTMENT_OTHER)
Admission: RE | Disposition: A | Discharge status: Home / Self Care | Payer: Self-pay | Source: Home / Self Care | Attending: Obstetrics and Gynecology

## 2022-06-05 ENCOUNTER — Observation Stay (HOSPITAL_BASED_OUTPATIENT_CLINIC_OR_DEPARTMENT_OTHER)
Admission: RE | Admit: 2022-06-05 | Discharge: 2022-06-06 | Disposition: A | Discharge status: Home / Self Care | Payer: BC Managed Care – PPO | Attending: Obstetrics and Gynecology | Admitting: Obstetrics and Gynecology

## 2022-06-05 DIAGNOSIS — N8501 Benign endometrial hyperplasia: Secondary | ICD-10-CM | POA: Diagnosis not present

## 2022-06-05 DIAGNOSIS — Z01818 Encounter for other preprocedural examination: Secondary | ICD-10-CM

## 2022-06-05 DIAGNOSIS — N72 Inflammatory disease of cervix uteri: Secondary | ICD-10-CM | POA: Diagnosis not present

## 2022-06-05 DIAGNOSIS — Z90710 Acquired absence of both cervix and uterus: Secondary | ICD-10-CM | POA: Diagnosis present

## 2022-06-05 DIAGNOSIS — N920 Excessive and frequent menstruation with regular cycle: Secondary | ICD-10-CM | POA: Diagnosis present

## 2022-06-05 DIAGNOSIS — D251 Intramural leiomyoma of uterus: Principal | ICD-10-CM | POA: Insufficient documentation

## 2022-06-05 DIAGNOSIS — D259 Leiomyoma of uterus, unspecified: Secondary | ICD-10-CM | POA: Diagnosis present

## 2022-06-05 DIAGNOSIS — J45909 Unspecified asthma, uncomplicated: Secondary | ICD-10-CM | POA: Diagnosis not present

## 2022-06-05 DIAGNOSIS — Z0289 Encounter for other administrative examinations: Secondary | ICD-10-CM

## 2022-06-05 DIAGNOSIS — F17210 Nicotine dependence, cigarettes, uncomplicated: Secondary | ICD-10-CM | POA: Insufficient documentation

## 2022-06-05 DIAGNOSIS — B20 Human immunodeficiency virus [HIV] disease: Secondary | ICD-10-CM | POA: Diagnosis not present

## 2022-06-05 DIAGNOSIS — N921 Excessive and frequent menstruation with irregular cycle: Secondary | ICD-10-CM

## 2022-06-05 HISTORY — DX: Presence of spectacles and contact lenses: Z97.3

## 2022-06-05 HISTORY — PX: VAGINAL HYSTERECTOMY: SHX2639

## 2022-06-05 HISTORY — DX: Benign neoplasm of connective and other soft tissue, unspecified: D21.9

## 2022-06-05 LAB — CBC
HCT: 25.1 % — ABNORMAL LOW (ref 36.0–46.0)
Hemoglobin: 8.4 g/dL — ABNORMAL LOW (ref 12.0–15.0)
MCH: 29.6 pg (ref 26.0–34.0)
MCHC: 33.5 g/dL (ref 30.0–36.0)
MCV: 88.4 fL (ref 80.0–100.0)
Platelets: 168 10*3/uL (ref 150–400)
RBC: 2.84 MIL/uL — ABNORMAL LOW (ref 3.87–5.11)
RDW: 12.9 % (ref 11.5–15.5)
WBC: 13.9 10*3/uL — ABNORMAL HIGH (ref 4.0–10.5)
nRBC: 0 % (ref 0.0–0.2)

## 2022-06-05 LAB — POCT I-STAT, CHEM 8
BUN: 10 mg/dL (ref 6–20)
Calcium, Ion: 1.22 mmol/L (ref 1.15–1.40)
Chloride: 103 mmol/L (ref 98–111)
Creatinine, Ser: 0.7 mg/dL (ref 0.44–1.00)
Glucose, Bld: 133 mg/dL — ABNORMAL HIGH (ref 70–99)
HCT: 30 % — ABNORMAL LOW (ref 36.0–46.0)
Hemoglobin: 10.2 g/dL — ABNORMAL LOW (ref 12.0–15.0)
Potassium: 3.9 mmol/L (ref 3.5–5.1)
Sodium: 138 mmol/L (ref 135–145)
TCO2: 22 mmol/L (ref 22–32)

## 2022-06-05 LAB — ABO/RH: ABO/RH(D): A POS

## 2022-06-05 LAB — CREATININE, SERUM
Creatinine, Ser: 0.75 mg/dL (ref 0.44–1.00)
GFR, Estimated: >60 mL/min (ref >60)

## 2022-06-05 LAB — PREPARE RBC (CROSSMATCH)

## 2022-06-05 LAB — POCT PREGNANCY, URINE: Preg Test, Ur: NEGATIVE

## 2022-06-05 SURGERY — HYSTERECTOMY, VAGINAL
Anesthesia: General | Site: Vagina

## 2022-06-05 MED ORDER — LIDOCAINE HCL (PF) 2 % IJ SOLN
INTRAMUSCULAR | Status: AC
  Filled 2022-06-05: qty 5

## 2022-06-05 MED ORDER — LACTATED RINGERS IV SOLN: INTRAVENOUS | Status: DC

## 2022-06-05 MED ORDER — KETOROLAC TROMETHAMINE 15 MG/ML IJ SOLN: 15.0000 mg | INTRAMUSCULAR | Status: AC

## 2022-06-05 MED ORDER — SODIUM CHLORIDE 0.9 % IV SOLN: 10.0000 mL/h | Freq: Once | INTRAVENOUS | Status: DC

## 2022-06-05 MED ORDER — DEXMEDETOMIDINE HCL IN NACL 80 MCG/20ML IV SOLN
INTRAVENOUS | Status: DC | PRN
  Administered 2022-06-05: 8 ug via BUCCAL
  Administered 2022-06-05: 4 ug via BUCCAL
  Administered 2022-06-05 (×2): 8 ug via BUCCAL

## 2022-06-05 MED ORDER — DOCUSATE SODIUM 100 MG PO CAPS
ORAL_CAPSULE | ORAL | Status: AC
  Filled 2022-06-05: qty 1

## 2022-06-05 MED ORDER — MAGNESIUM HYDROXIDE 400 MG/5ML PO SUSP
30.0000 mL | Freq: Every day | ORAL | Status: DC | PRN
  Filled 2022-06-05: qty 30

## 2022-06-05 MED ORDER — POVIDONE-IODINE 10 % EX SWAB: 2.0000 | Freq: Once | CUTANEOUS | Status: DC

## 2022-06-05 MED ORDER — KETOROLAC TROMETHAMINE 30 MG/ML IJ SOLN
INTRAMUSCULAR | Status: AC
  Filled 2022-06-05: qty 1

## 2022-06-05 MED ORDER — ACETAMINOPHEN 325 MG PO TABS
650.0000 mg | ORAL_TABLET | ORAL | Status: DC | PRN
  Administered 2022-06-05 – 2022-06-06 (×3): 650 mg via ORAL

## 2022-06-05 MED ORDER — ONDANSETRON HCL 4 MG/2ML IJ SOLN
4.0000 mg | Freq: Four times a day (QID) | INTRAMUSCULAR | Status: DC | PRN
  Administered 2022-06-05: 4 mg via INTRAVENOUS

## 2022-06-05 MED ORDER — ROCURONIUM BROMIDE 10 MG/ML (PF) SYRINGE
PREFILLED_SYRINGE | INTRAVENOUS | Status: DC | PRN
  Administered 2022-06-05: 10 mg via INTRAVENOUS
  Administered 2022-06-05: 20 mg via INTRAVENOUS
  Administered 2022-06-05: 30 mg via INTRAVENOUS
  Administered 2022-06-05: 10 mg via INTRAVENOUS
  Administered 2022-06-05: 20 mg via INTRAVENOUS

## 2022-06-05 MED ORDER — 0.9 % SODIUM CHLORIDE (POUR BTL) OPTIME
TOPICAL | Status: DC | PRN
  Administered 2022-06-05: 500 mL

## 2022-06-05 MED ORDER — CEFAZOLIN SODIUM-DEXTROSE 2-4 GM/100ML-% IV SOLN
2.0000 g | INTRAVENOUS | Status: AC
  Administered 2022-06-05: 2 g via INTRAVENOUS

## 2022-06-05 MED ORDER — SOD CITRATE-CITRIC ACID 500-334 MG/5ML PO SOLN: 30.0000 mL | ORAL | Status: DC

## 2022-06-05 MED ORDER — OXYCODONE HCL 5 MG PO TABS
ORAL_TABLET | ORAL | Status: AC
  Filled 2022-06-05: qty 2

## 2022-06-05 MED ORDER — ALBUMIN HUMAN 5 % IV SOLN: INTRAVENOUS | Status: DC | PRN

## 2022-06-05 MED ORDER — GABAPENTIN 300 MG PO CAPS
300.0000 mg | ORAL_CAPSULE | Freq: Two times a day (BID) | ORAL | Status: DC
  Administered 2022-06-05 (×2): 300 mg via ORAL

## 2022-06-05 MED ORDER — LIDOCAINE 2% (20 MG/ML) 5 ML SYRINGE
INTRAMUSCULAR | Status: DC | PRN
  Administered 2022-06-05: 100 mg via INTRAVENOUS

## 2022-06-05 MED ORDER — ACETAMINOPHEN 325 MG PO TABS
ORAL_TABLET | ORAL | Status: AC
  Filled 2022-06-05: qty 2

## 2022-06-05 MED ORDER — FENTANYL CITRATE (PF) 100 MCG/2ML IJ SOLN
INTRAMUSCULAR | Status: AC
  Filled 2022-06-05: qty 2

## 2022-06-05 MED ORDER — FENTANYL CITRATE (PF) 100 MCG/2ML IJ SOLN
INTRAMUSCULAR | Status: DC | PRN
  Administered 2022-06-05 (×2): 50 ug via INTRAVENOUS

## 2022-06-05 MED ORDER — SCOPOLAMINE 1 MG/3DAYS TD PT72
MEDICATED_PATCH | TRANSDERMAL | Status: AC
  Filled 2022-06-05: qty 1

## 2022-06-05 MED ORDER — GABAPENTIN 300 MG PO CAPS
ORAL_CAPSULE | ORAL | Status: AC
  Filled 2022-06-05: qty 1

## 2022-06-05 MED ORDER — IBUPROFEN 200 MG PO TABS
ORAL_TABLET | ORAL | Status: AC
  Filled 2022-06-05: qty 3

## 2022-06-05 MED ORDER — DEXMEDETOMIDINE HCL IN NACL 80 MCG/20ML IV SOLN
INTRAVENOUS | Status: AC
  Filled 2022-06-05: qty 20

## 2022-06-05 MED ORDER — HYDROMORPHONE HCL 1 MG/ML IJ SOLN
INTRAMUSCULAR | Status: AC
  Filled 2022-06-05: qty 1

## 2022-06-05 MED ORDER — IBUPROFEN 200 MG PO TABS
600.0000 mg | ORAL_TABLET | Freq: Four times a day (QID) | ORAL | Status: DC
  Administered 2022-06-05 – 2022-06-06 (×3): 600 mg via ORAL

## 2022-06-05 MED ORDER — ACETAMINOPHEN 500 MG PO TABS
1000.0000 mg | ORAL_TABLET | Freq: Once | ORAL | Status: AC
  Administered 2022-06-05: 1000 mg via ORAL

## 2022-06-05 MED ORDER — SUGAMMADEX SODIUM 200 MG/2ML IV SOLN
INTRAVENOUS | Status: DC | PRN
  Administered 2022-06-05: 110 mg via INTRAVENOUS

## 2022-06-05 MED ORDER — SUCCINYLCHOLINE CHLORIDE 200 MG/10ML IV SOSY
PREFILLED_SYRINGE | INTRAVENOUS | Status: DC | PRN
  Administered 2022-06-05: 120 mg via INTRAVENOUS

## 2022-06-05 MED ORDER — FENTANYL CITRATE (PF) 250 MCG/5ML IJ SOLN
INTRAMUSCULAR | Status: AC
  Filled 2022-06-05: qty 5

## 2022-06-05 MED ORDER — DOCUSATE SODIUM 100 MG PO CAPS
100.0000 mg | ORAL_CAPSULE | Freq: Two times a day (BID) | ORAL | Status: DC
  Administered 2022-06-05: 100 mg via ORAL

## 2022-06-05 MED ORDER — OXYCODONE HCL 5 MG PO TABS
5.0000 mg | ORAL_TABLET | ORAL | Status: DC | PRN
  Administered 2022-06-05 (×3): 10 mg via ORAL
  Administered 2022-06-06: 5 mg via ORAL
  Administered 2022-06-06: 10 mg via ORAL

## 2022-06-05 MED ORDER — DEXAMETHASONE SODIUM PHOSPHATE 10 MG/ML IJ SOLN
INTRAMUSCULAR | Status: AC
  Filled 2022-06-05: qty 1

## 2022-06-05 MED ORDER — HYDROMORPHONE HCL 1 MG/ML IJ SOLN
0.5000 mg | INTRAMUSCULAR | Status: AC | PRN
  Administered 2022-06-05 (×2): 0.5 mg via INTRAVENOUS

## 2022-06-05 MED ORDER — ONDANSETRON HCL 4 MG/2ML IJ SOLN
INTRAMUSCULAR | Status: AC
  Filled 2022-06-05: qty 2

## 2022-06-05 MED ORDER — KETOROLAC TROMETHAMINE 30 MG/ML IJ SOLN: 15.0000 mg | Freq: Once | INTRAMUSCULAR | Status: DC

## 2022-06-05 MED ORDER — LACTATED RINGERS IV SOLN
INTRAVENOUS | Status: DC
  Administered 2022-06-05: 1 mL via INTRAVENOUS

## 2022-06-05 MED ORDER — ROCURONIUM BROMIDE 10 MG/ML (PF) SYRINGE
PREFILLED_SYRINGE | INTRAVENOUS | Status: AC
  Filled 2022-06-05: qty 10

## 2022-06-05 MED ORDER — SCOPOLAMINE 1 MG/3DAYS TD PT72
1.0000 | MEDICATED_PATCH | TRANSDERMAL | Status: DC
  Administered 2022-06-05: 1.5 mg via TRANSDERMAL

## 2022-06-05 MED ORDER — PROPOFOL 10 MG/ML IV BOLUS
INTRAVENOUS | Status: AC
  Filled 2022-06-05: qty 20

## 2022-06-05 MED ORDER — MIDAZOLAM HCL 2 MG/2ML IJ SOLN
INTRAMUSCULAR | Status: AC
  Filled 2022-06-05: qty 2

## 2022-06-05 MED ORDER — FENTANYL CITRATE (PF) 100 MCG/2ML IJ SOLN
25.0000 ug | INTRAMUSCULAR | Status: DC | PRN
  Administered 2022-06-05 (×3): 50 ug via INTRAVENOUS

## 2022-06-05 MED ORDER — DIPHENHYDRAMINE HCL 25 MG PO CAPS
25.0000 mg | ORAL_CAPSULE | Freq: Once | ORAL | Status: AC
  Administered 2022-06-05: 25 mg via ORAL

## 2022-06-05 MED ORDER — DEXAMETHASONE SODIUM PHOSPHATE 10 MG/ML IJ SOLN
INTRAMUSCULAR | Status: DC | PRN
  Administered 2022-06-05: 5 mg via INTRAVENOUS

## 2022-06-05 MED ORDER — ALBUMIN HUMAN 5 % IV SOLN
INTRAVENOUS | Status: AC
  Filled 2022-06-05: qty 500

## 2022-06-05 MED ORDER — ACETAMINOPHEN 500 MG PO TABS: 1000.0000 mg | ORAL_TABLET | ORAL | Status: DC

## 2022-06-05 MED ORDER — KETOROLAC TROMETHAMINE 30 MG/ML IJ SOLN
INTRAMUSCULAR | Status: DC | PRN
  Administered 2022-06-05: 15 mg via INTRAVENOUS

## 2022-06-05 MED ORDER — ONDANSETRON HCL 4 MG PO TABS: 4.0000 mg | ORAL_TABLET | Freq: Four times a day (QID) | ORAL | Status: DC | PRN

## 2022-06-05 MED ORDER — PROPOFOL 10 MG/ML IV BOLUS
INTRAVENOUS | Status: DC | PRN
  Administered 2022-06-05: 40 mg via INTRAVENOUS
  Administered 2022-06-05: 160 mg via INTRAVENOUS

## 2022-06-05 MED ORDER — CEFAZOLIN SODIUM-DEXTROSE 2-4 GM/100ML-% IV SOLN
INTRAVENOUS | Status: AC
  Filled 2022-06-05: qty 100

## 2022-06-05 MED ORDER — ACETAMINOPHEN 500 MG PO TABS
ORAL_TABLET | ORAL | Status: AC
  Filled 2022-06-05: qty 2

## 2022-06-05 MED ORDER — DIPHENHYDRAMINE HCL 25 MG PO CAPS
ORAL_CAPSULE | ORAL | Status: AC
  Filled 2022-06-05: qty 1

## 2022-06-05 MED ORDER — SODIUM CHLORIDE 0.9 % IV SOLN: INTRAVENOUS | Status: DC | PRN

## 2022-06-05 MED ORDER — ONDANSETRON HCL 4 MG/2ML IJ SOLN
INTRAMUSCULAR | Status: DC | PRN
  Administered 2022-06-05: 4 mg via INTRAVENOUS

## 2022-06-05 MED ORDER — ENOXAPARIN SODIUM 40 MG/0.4ML IJ SOSY
40.0000 mg | PREFILLED_SYRINGE | INTRAMUSCULAR | Status: DC
  Administered 2022-06-06: 40 mg via SUBCUTANEOUS

## 2022-06-05 MED ORDER — SIMETHICONE 80 MG PO CHEW: 80.0000 mg | CHEWABLE_TABLET | Freq: Four times a day (QID) | ORAL | Status: DC | PRN

## 2022-06-05 MED ORDER — MIDAZOLAM HCL 5 MG/5ML IJ SOLN
INTRAMUSCULAR | Status: DC | PRN
  Administered 2022-06-05: 2 mg via INTRAVENOUS

## 2022-06-05 SURGICAL SUPPLY — 29 items
BLADE SURG 10 STRL SS (BLADE) ×1 IMPLANT
DRAPE SHEET LG 3/4 BI-LAMINATE (DRAPES) IMPLANT
GAUZE 4X4 16PLY ~~LOC~~+RFID DBL (SPONGE) ×2 IMPLANT
GLOVE BIO SURGEON STRL SZ7.5 (GLOVE) ×1 IMPLANT
GLOVE BIOGEL PI IND STRL 6.5 (GLOVE) ×1 IMPLANT
GLOVE BIOGEL PI IND STRL 7.0 (GLOVE) IMPLANT
GLOVE BIOGEL PI IND STRL 7.5 (GLOVE) IMPLANT
GLOVE ECLIPSE 6.5 STRL STRAW (GLOVE) IMPLANT
GLOVE SURG LX STRL 7.5 STRW (GLOVE) IMPLANT
GLOVE SURG ORTHO 8.0 STRL STRW (GLOVE) ×1 IMPLANT
GOWN STRL REUS W/TWL LRG LVL3 (GOWN DISPOSABLE) ×3 IMPLANT
GOWN STRL REUS W/TWL XL LVL3 (GOWN DISPOSABLE) ×1 IMPLANT
HIBICLENS CHG 4% 4OZ BTL (MISCELLANEOUS) ×1 IMPLANT
HOLDER FOLEY CATH W/STRAP (MISCELLANEOUS) IMPLANT
KIT TURNOVER CYSTO (KITS) ×1 IMPLANT
LEGGING LITHOTOMY PAIR STRL (DRAPES) IMPLANT
MANIFOLD NEPTUNE II (INSTRUMENTS) IMPLANT
NS IRRIG 500ML POUR BTL (IV SOLUTION) IMPLANT
PACK VAGINAL WOMENS (CUSTOM PROCEDURE TRAY) ×1 IMPLANT
PAD OB MATERNITY 4.3X12.25 (PERSONAL CARE ITEMS) ×1 IMPLANT
SPIKE FLUID TRANSFER (MISCELLANEOUS) IMPLANT
SUT VIC AB 2-0 CT1 18 (SUTURE) ×1 IMPLANT
SUT VIC AB 2-0 CT1 27 (SUTURE) ×1
SUT VIC AB 2-0 CT1 TAPERPNT 27 (SUTURE) ×1 IMPLANT
SUT VIC AB PLUS 45CM 1-MO-4 (SUTURE) ×2 IMPLANT
SUT VICRYL 1 TIES 12X18 (SUTURE) ×1 IMPLANT
TOWEL OR 17X26 10 PK STRL BLUE (TOWEL DISPOSABLE) ×2 IMPLANT
TRAY FOLEY W/BAG SLVR 14FR (SET/KITS/TRAYS/PACK) IMPLANT
TUBE CONNECTING 12X1/4 (SUCTIONS) IMPLANT

## 2022-06-05 NOTE — Anesthesia Postprocedure Evaluation (Signed)
Anesthesia Post Note  Patient: Angelica Hopkins  Procedure(s) Performed: HYSTERECTOMY VAGINAL (Vagina )     Patient location during evaluation: PACU Anesthesia Type: General Level of consciousness: awake Pain management: pain level controlled Vital Signs Assessment: post-procedure vital signs reviewed and stable Respiratory status: spontaneous breathing Cardiovascular status: stable Postop Assessment: no apparent nausea or vomiting Anesthetic complications: no   No notable events documented.  Last Vitals:  Vitals:   06/05/22 1215 06/05/22 1230  BP: 100/71   Pulse: 66 68  Resp: 15 13  Temp:    SpO2: 96% 97%    Last Pain:  Vitals:   06/05/22 1215  TempSrc:   PainSc: Asleep                 Rykker Coviello

## 2022-06-05 NOTE — Anesthesia Procedure Notes (Signed)
Procedure Name: Intubation Date/Time: 06/05/2022 8:35 AM  Performed by: Bonney Aid, CRNAPre-anesthesia Checklist: Patient identified, Emergency Drugs available, Suction available and Patient being monitored Patient Re-evaluated:Patient Re-evaluated prior to induction Oxygen Delivery Method: Circle system utilized Preoxygenation: Pre-oxygenation with 100% oxygen Induction Type: IV induction Ventilation: Mask ventilation without difficulty Laryngoscope Size: Mac and 3 Grade View: Grade I Tube type: Oral Tube size: 7.0 mm Number of attempts: 1 Airway Equipment and Method: Stylet Placement Confirmation: ETT inserted through vocal cords under direct vision, positive ETCO2 and breath sounds checked- equal and bilateral Secured at: 21 cm Tube secured with: Tape Dental Injury: Teeth and Oropharynx as per pre-operative assessment

## 2022-06-05 NOTE — Progress Notes (Signed)
Report to [facility]

## 2022-06-05 NOTE — Interval H&P Note (Signed)
History and Physical Interval Note:  06/05/2022 8:23 AM  Angelica Hopkins  has presented today for surgery, with the diagnosis of metrorrhagia fibroids.  The various methods of treatment have been discussed with the patient and family. After consideration of risks, benefits and other options for treatment, the patient has consented to  Procedure(s): HYSTERECTOMY VAGINAL (N/A) as a surgical intervention.  The patient's history has been reviewed, patient examined, no change in status, stable for surgery.  I have reviewed the patient's chart and labs.  Questions were answered to the patient's satisfaction.     Griffin Basil

## 2022-06-05 NOTE — Transfer of Care (Signed)
Immediate Anesthesia Transfer of Care Note  Patient: Salina Stanfield  Procedure(s) Performed: HYSTERECTOMY VAGINAL (Vagina )  Patient Location: PACU  Anesthesia Type:General  Level of Consciousness: awake and patient cooperative  Airway & Oxygen Therapy: Patient Spontanous Breathing and Patient connected to nasal cannula oxygen  Post-op Assessment: Report given to RN and Post -op Vital signs reviewed and stable  Post vital signs: Reviewed and stable  Last Vitals:  Vitals Value Taken Time  BP 111/78 06/05/22 1130  Temp    Pulse 72 06/05/22 1137  Resp 16 06/05/22 1137  SpO2 100 % 06/05/22 1137  Vitals shown include unvalidated device data.  Last Pain:  Vitals:   06/05/22 0644  TempSrc: Oral      Patients Stated Pain Goal: 7 (83/33/83 2919)  Complications: No notable events documented.

## 2022-06-05 NOTE — Op Note (Addendum)
[name] PROCEDURE DATE: 06/05/2022  PREOPERATIVE DIAGNOSIS:  Symptomatic fibroids, menorrhagia POSTOPERATIVE DIAGNOSIS:  Symptomatic fibroids, menorrhagia SURGEON:   Lynnda Shields M.D., FACOG ASSISTANT: Arlina Robes, M.D. OPERATION:  Total Vaginal hysterectomy , lysis of adhesions ANESTHESIA:  General endotracheal.  An experienced assistant was required given the standard of surgical care given the complexity of the case.  This assistant was needed for exposure, dissection, suctioning, retraction, instrument exchange, and for overall help during the procedure.   INDICATIONS: The patient is a 41 y.o. G2P1011 with history of symptomatic uterine fibroids/menorrhagia. The patient made a decision to undergo definite surgical treatment. On the preoperative visit, the risks, benefits, indications, and alternatives of the procedure were reviewed with the patient.  On the day of surgery, the risks of surgery were again discussed with the patient including but not limited to: bleeding which may require transfusion or reoperation; infection which may require antibiotics; injury to bowel, bladder, ureters or other surrounding organs; need for additional procedures; thromboembolic phenomenon, incisional problems and other postoperative/anesthesia complications. Written informed consent was obtained.    OPERATIVE FINDINGS: A 11 week size uterus with normal tubes and ovaries bilaterally.  ESTIMATED BLOOD LOSS: 1300 ml FLUIDS:  1900 ml of Lactated Ringers Albumin: 500 ml URINE OUTPUT:  175 ml of clear yellow urine. SPECIMENS:  Uterus and cervix sent to pathology COMPLICATIONS:  None immediate.  DESCRIPTION OF PROCEDURE:  The patient received intravenous antibiotics and had sequential compression devices applied to her lower extremities while in the preoperative area.  She was then taken to the operating room where general anesthesia was administered and was found to be adequate.  She was placed in  the dorsal lithotomy position, and was prepped and draped in a sterile manner.  The patients bladder was drained with a foley catheter that was placed to gravity. After an adequate timeout was performed, attention was turned to her pelvis.  A weighted speculum was then placed in the vagina, and the anterior and posterior lips of the cervix were grasped bilaterally with tenaculums.    The cervix was then circumferentially incised, and the bladder was dissected off the pubocervical fascia without complication.  The posterior cul-de-sac was entered sharply without difficulty. A suture was placed on the posterior vagina.  A long weighted speculum was inserted into the posterior cul-de-sac.  The Heaney clamp was then used to clamp the uterosacral ligaments on either side.  They were then cut and sutured ligated with 0 Vicryl, and the ligated uterosacral ligaments were transfixed to the posterior lateral vaginal epithelium to further support the vagina and provide hemostasis. Of note, all sutures used in this case were 0 Vicryl unless otherwise noted.   The cardinal ligaments were then clamped, cut and ligated. The uterine vessels and broad ligaments were then serially clamped with the Heaney clamps, cut, and suture ligated on both sides.  Excellent hemostasis was noted at this point.  Due to the size of the anterior fibroid, it was morcellated using a coring technique. During the coring, the  anterior cul-de-sac was then entered sharpely.  The uterus was then delivered via the posterior cul-de-sac, and the  right cornua were clamped with the Heaney clamps and transected.  There was some difficulty isolating the left cornua and fundus and we later found there was a thick band of scar adhering the anterior portion of the fundus to the anterior abdominal wall. The left cornua was finally ligated, sharply incised and suture secured. The uterus was finally delivered and  sent to pathology. These pedicles were then suture  ligated to ensure hemostasis.  After completion of the hysterectomy,  All pedicles from the uterosacral ligament to the cornua were examined hemostasis was confirmed.  The vaginal cuff was reefed in a running locked fashion then reapproximated using figure of eight sutures care was given to incorporate the uterosacral pedicles bilaterally.  All instruments were then removed from the pelvis. The patient tolerated the procedure well.  All instruments, needles, and sponge counts were correct x 2. The patient was taken to the recovery room in stable condition.      Lynnda Shields, MD Faculty attending, [facility] [employer]

## 2022-06-05 NOTE — Progress Notes (Signed)
Dr. Elgie Congo called and made aware of hgb 8.4 with most recent cbc. Verbal orders received to administer 25 mg PO Benadryl and transfuse 1 unit PRBC. Orders placed and enacted. Education provided to patient.  Angelica Hopkins, Angelica Hopkins

## 2022-06-05 NOTE — Progress Notes (Signed)
Assumed care of patient from Irena Reichmann, RN for lunch relief

## 2022-06-06 ENCOUNTER — Encounter (HOSPITAL_BASED_OUTPATIENT_CLINIC_OR_DEPARTMENT_OTHER): Payer: Self-pay | Admitting: Obstetrics and Gynecology

## 2022-06-06 ENCOUNTER — Encounter: Payer: Self-pay | Admitting: Obstetrics and Gynecology

## 2022-06-06 DIAGNOSIS — D251 Intramural leiomyoma of uterus: Secondary | ICD-10-CM | POA: Diagnosis not present

## 2022-06-06 LAB — CBC
HCT: 25.4 % — ABNORMAL LOW (ref 36.0–46.0)
Hemoglobin: 8.7 g/dL — ABNORMAL LOW (ref 12.0–15.0)
MCH: 29.5 pg (ref 26.0–34.0)
MCHC: 34.3 g/dL (ref 30.0–36.0)
MCV: 86.1 fL (ref 80.0–100.0)
Platelets: 153 10*3/uL (ref 150–400)
RBC: 2.95 MIL/uL — ABNORMAL LOW (ref 3.87–5.11)
RDW: 14 % (ref 11.5–15.5)
WBC: 14.9 10*3/uL — ABNORMAL HIGH (ref 4.0–10.5)
nRBC: 0 % (ref 0.0–0.2)

## 2022-06-06 LAB — SURGICAL PATHOLOGY

## 2022-06-06 MED ORDER — ACETAMINOPHEN 325 MG PO TABS
ORAL_TABLET | ORAL | Status: AC
  Filled 2022-06-06: qty 2

## 2022-06-06 MED ORDER — DOCUSATE SODIUM 100 MG PO CAPS: 100.0000 mg | ORAL_CAPSULE | Freq: Two times a day (BID) | ORAL | 0 refills | Status: DC

## 2022-06-06 MED ORDER — IBUPROFEN 200 MG PO TABS
ORAL_TABLET | ORAL | Status: AC
  Filled 2022-06-06: qty 3

## 2022-06-06 MED ORDER — IBUPROFEN 600 MG PO TABS: 600.0000 mg | ORAL_TABLET | Freq: Four times a day (QID) | ORAL | 2 refills | Status: DC | PRN

## 2022-06-06 MED ORDER — ENOXAPARIN SODIUM 40 MG/0.4ML IJ SOSY
PREFILLED_SYRINGE | INTRAMUSCULAR | Status: AC
  Filled 2022-06-06: qty 0.4

## 2022-06-06 MED ORDER — FERROUS SULFATE 325 (65 FE) MG PO TABS: 325.0000 mg | ORAL_TABLET | ORAL | 2 refills | Status: DC

## 2022-06-06 MED ORDER — OXYCODONE HCL 5 MG PO TABS
ORAL_TABLET | ORAL | Status: AC
  Filled 2022-06-06: qty 2

## 2022-06-06 MED ORDER — OXYCODONE HCL 5 MG PO TABS: 5.0000 mg | ORAL_TABLET | ORAL | 0 refills | Status: DC | PRN

## 2022-06-06 NOTE — Discharge Summary (Signed)
Physician Discharge Summary  Patient ID: Angelica Hopkins MRN: [id number] DOB/AGE: [birth year] 41 y.o.  Admit date: 06/05/2022 Discharge date: 06/06/2022  Admission Diagnoses: menorrhagia, fibroid  Discharge Diagnoses:  Principal Problem:   Menorrhagia with regular cycle Active Problems:   S/P vaginal hysterectomy   Discharged Condition: good  Hospital Course: Pt admitted for [facility].  See separate operative note for details. Pt did have post op drop in h/h to  8.4/25.1.  Pt was transfused 1 unit PRBC without issue.  She did well overnight and notes flatus, voiding without issue and tolerating regular diet.  Subjectively, the patient looked well and was in good spirits.  She met all discharge criteria and was sent home on POD 1.  Consults: None  Significant Diagnostic Studies: labs: cbc  Treatments: anticoagulation: LMW heparin, surgery: vaginal hysterectomy, and blood transfusion  Discharge Exam: Blood pressure 111/72, pulse 64, temperature 99.3 F (37.4 C), resp. rate 16, height _0  (1.626 m), weight 55.5 kg, last menstrual period 05/10/2022, SpO2 100 %. General appearance: alert, cooperative, and no distress Head: Normocephalic, without obvious abnormality, atraumatic Cardio: regular rate and rhythm GI: soft , nondistended, appropriately tender, positive bowel sounds Extremities: Homans sign is negative, no sign of DVT and no edema, redness or tenderness in the calves or thighs Resp:  clear to auscultation bilaterally  Disposition: Discharge disposition: 01-Home or Self Care       Discharge Instructions     Call MD for:  difficulty breathing, headache or visual disturbances   Complete by: As directed    Call MD for:  persistant dizziness or light-headedness   Complete by: As directed    Call MD for:  persistant nausea and vomiting   Complete by: As directed    Call MD for:  severe uncontrolled pain   Complete by: As directed    Call MD for:  temperature >100.4    Complete by: As directed    Diet - low sodium heart healthy   Complete by: As directed    Driving Restrictions   Complete by: As directed    May resume in 1 week   Increase activity slowly   Complete by: As directed    Lifting restrictions   Complete by: As directed    Max 20-25 pounds   Sexual Activity Restrictions   Complete by: As directed    Pelvic rest for 6 weeks      Allergies as of 06/06/2022       Reactions   Hydrocodone-acetaminophen Itching   Patient reported   Kiwi Extract Other (See Comments)   Tongue gets numb and tingling.        Medication List     TAKE these medications    albuterol 108 (90 Base) MCG/ACT inhaler Commonly known as: VENTOLIN HFA Inhale 1-2 puffs into the lungs every 6 (six) hours as needed for wheezing or shortness of breath. What changed:  how much to take when to take this   Biktarvy 50-200-25 MG Tabs tablet Generic drug: bictegravir-emtricitabine-tenofovir AF Take one tablet daily   docusate sodium 100 MG capsule Commonly known as: COLACE Take 1 capsule (100 mg total) by mouth 2 (two) times daily.   ferrous sulfate 325 (65 FE) MG tablet Take 1 tablet (325 mg total) by mouth every other day.   ibuprofen 600 MG tablet Commonly known as: ADVIL Take 1 tablet (600 mg total) by mouth every 6 (six) hours as needed.   oxyCODONE 5 MG immediate release tablet Commonly known as:  Oxy IR/ROXICODONE Take 1-2 tablets (5-10 mg total) by mouth every 4 (four) hours as needed for moderate pain.        Follow-up [place] [facility]. Schedule an appointment as soon as possible for a visit in 1 month(s).   Specialty: Obstetrics and Gynecology Why: post op Contact information: [place] [address] [phone]                Signed: Griffin Basil 06/06/2022, 8:53 AM

## 2022-06-06 NOTE — Progress Notes (Signed)
Dr. Elgie Congo called with Hgb results of 8.7 this morning.  No new orders.

## 2022-06-09 LAB — TYPE AND SCREEN
ABO/RH(D): A POS
Antibody Screen: NEGATIVE
Unit division: 0
Unit division: 0

## 2022-06-09 LAB — BPAM RBC
Blood Product Expiration Date: 202312012359
Blood Product Expiration Date: 202312022359
ISSUE DATE / TIME: 202311081020
ISSUE DATE / TIME: 202311081805
Unit Type and Rh: 6200
Unit Type and Rh: 6200

## 2022-06-13 ENCOUNTER — Encounter: Payer: Self-pay | Admitting: Obstetrics and Gynecology

## 2022-06-13 ENCOUNTER — Other Ambulatory Visit (INDEPENDENT_AMBULATORY_CARE_PROVIDER_SITE_OTHER): Payer: Self-pay | Admitting: Obstetrics and Gynecology

## 2022-06-13 ENCOUNTER — Other Ambulatory Visit (INDEPENDENT_AMBULATORY_CARE_PROVIDER_SITE_OTHER): Payer: BC Managed Care – PPO | Admitting: Obstetrics and Gynecology

## 2022-06-13 DIAGNOSIS — G8918 Other acute postprocedural pain: Secondary | ICD-10-CM

## 2022-06-13 MED ORDER — OXYCODONE HCL 5 MG PO TABS: 5.0000 mg | ORAL_TABLET | ORAL | 0 refills | Status: DC | PRN

## 2022-06-13 NOTE — Progress Notes (Signed)
Refill of oxycodone # 10 sent, no refill.

## 2022-07-10 ENCOUNTER — Encounter: Payer: Self-pay | Admitting: Obstetrics and Gynecology

## 2022-07-10 ENCOUNTER — Ambulatory Visit (INDEPENDENT_AMBULATORY_CARE_PROVIDER_SITE_OTHER): Payer: BC Managed Care – PPO | Admitting: Obstetrics and Gynecology

## 2022-07-10 VITALS — BP 105/74 | HR 85 | Ht 64.0 in | Wt 119.5 lb

## 2022-07-10 DIAGNOSIS — Z4889 Encounter for other specified surgical aftercare: Secondary | ICD-10-CM

## 2022-07-10 DIAGNOSIS — D509 Iron deficiency anemia, unspecified: Secondary | ICD-10-CM

## 2022-07-10 NOTE — Progress Notes (Signed)
Pt reports extreme tiredness during the day& gets out of breath when climbs stairs.Also states when she stretches arms feels a weird pain on her nightside but not as much as she was.

## 2022-07-10 NOTE — Progress Notes (Signed)
    Subjective:    Angelica Hopkins is a 41 y.o. female who presents to the clinic status post TVH on 06/05/22. The patient is not having any pain.  Eating a regular diet without difficulty. Bowel movements are normal. No other significant postoperative concerns.  She complains of some mild right abdominal wall pain, higher than would be expected from the procedure.  Pt also notes some mild fatigue.  She did have known anemia from the procedure and received a blood transfusion.  Will recheck cbc today.  The following portions of the patient's history were reviewed and updated as appropriate: allergies, current medications, past family history, past medical history, past social history, past surgical history, and problem list..  Last pap smear was normal on 04/29/22.  Review of Systems Pertinent items are noted in HPI.   Objective:   BP 105/74   Pulse 85   Ht '5\' 4"'$  (1.626 m)   Wt 119 lb 8 oz (54.2 kg)   LMP 05/30/2022 Comment: starting spotting  BMI 20.51 kg/m  Constitutional:  Well-developed, well-nourished female in no acute distress.   Skin: Skin is warm and dry, no rash noted, not diaphoretic,no erythema, no pallor.  Cardiovascular: Normal heart rate noted  Respiratory: Effort and breath sounds normal, no problems with respiration noted  Abdomen: Soft, bowel sounds active, non-tender, no abnormal masses  Incision: Healing well, no drainage, no erythema, no hernia, no seroma, no swelling, no dehiscence, incision well approximated  Pelvic:   Normal appearing external genitalia; normal appearing vaginal mucosa and well-healing vaginal cuff with sutures visible.  No abnormal discharge noted.   Surgical pathology () Benign uterus with uterine fibroids noted Assessment:   Doing well postoperatively.  Operative findings again reviewed. Pathology report discussed.   Plan:   1. Continue any current medications. 2. Wound care discussed. 3. Activity restrictions: none 4. Anticipated  return to work: 1-2 weeks. 5. Follow up as needed 6.  Routine preventative health maintenance measures emphasized. 7.  If pt has anemia with a hemoglobin less than 10, then will offer iron transfusion. Please refer to After Visit Summary for other counseling recommendations.    Lynnda Shields, MD, [place] Attending [place] for [facility], [place]

## 2022-07-11 LAB — CBC
Hematocrit: 36.9 % (ref 34.0–46.6)
Hemoglobin: 11.9 g/dL (ref 11.1–15.9)
MCH: 28.1 pg (ref 26.6–33.0)
MCHC: 32.2 g/dL (ref 31.5–35.7)
MCV: 87 fL (ref 79–97)
Platelets: 249 10*3/uL (ref 150–450)
RBC: 4.23 x10E6/uL (ref 3.77–5.28)
RDW: 12.2 % (ref 11.7–15.4)
WBC: 8.2 10*3/uL (ref 3.4–10.8)

## 2022-07-16 ENCOUNTER — Encounter: Payer: Self-pay | Admitting: Obstetrics and Gynecology

## 2022-08-15 ENCOUNTER — Other Ambulatory Visit: Payer: Self-pay | Admitting: Infectious Diseases

## 2022-08-15 ENCOUNTER — Other Ambulatory Visit: Payer: Self-pay

## 2022-08-15 ENCOUNTER — Other Ambulatory Visit (HOSPITAL_COMMUNITY): Payer: Self-pay

## 2022-08-15 ENCOUNTER — Ambulatory Visit (INDEPENDENT_AMBULATORY_CARE_PROVIDER_SITE_OTHER): Payer: BC Managed Care – PPO | Admitting: Infectious Diseases

## 2022-08-15 ENCOUNTER — Encounter: Payer: Self-pay | Admitting: Infectious Diseases

## 2022-08-15 VITALS — BP 145/83 | HR 71 | Temp 98.3°F | Wt 119.8 lb

## 2022-08-15 DIAGNOSIS — R630 Anorexia: Secondary | ICD-10-CM

## 2022-08-15 DIAGNOSIS — B181 Chronic viral hepatitis B without delta-agent: Secondary | ICD-10-CM

## 2022-08-15 DIAGNOSIS — B20 Human immunodeficiency virus [HIV] disease: Secondary | ICD-10-CM

## 2022-08-15 DIAGNOSIS — F419 Anxiety disorder, unspecified: Secondary | ICD-10-CM | POA: Diagnosis not present

## 2022-08-15 MED ORDER — HYDROXYZINE HCL 25 MG PO TABS: 25.0000 mg | ORAL_TABLET | Freq: Three times a day (TID) | ORAL | 5 refills | Status: DC | PRN

## 2022-08-15 MED ORDER — MEGESTROL ACETATE 625 MG/5ML PO SUSP: 625.0000 mg | Freq: Every day | ORAL | 0 refills | Status: DC

## 2022-08-15 MED ORDER — HYDROXYZINE HCL 25 MG PO TABS: 25.0000 mg | ORAL_TABLET | Freq: Three times a day (TID) | ORAL | 0 refills | Status: DC | PRN

## 2022-08-15 MED ORDER — BIKTARVY 50-200-25 MG PO TABS: ORAL_TABLET | ORAL | 11 refills | Status: DC

## 2022-08-15 NOTE — Progress Notes (Signed)
Name: Angelica Hopkins  DOB: May 04, 1981  MRN: [id number] PCP: Patient, No Pcp Per     Subjective   Brief Narrative:  Angelica Hopkins is a 42 y.o. female with well controlled HIV, Dx 2005. Deferred treatment with ART x 5 years.  HIV Risk: sexual Hep B co-infected  History of OIs: none.   Previous Regimens:  Genvoya 2010 >> suppressed, inconsistent use  Biktarvy 2018 >> suppressed    Genotype:  04/2017 - sensitive     CC: HIV / Hep B follow up  Increased anxiety and stress at work and home.    HPI:  Angelica Hopkins is here today.  Angelica Hopkins is very anxious.  Angelica Hopkins says Angelica Hopkins has chest pains and shaking and hyperventilation when Angelica Hopkins gets this anxious.  Experiences at least once a week.  Feels that the anxiety medication is too much and knocks her out now when Angelica Hopkins takes 50 mg.  Angelica Hopkins would like to try 25 mg to see if this helps better.   Has had trouble getting her Biktarvy from pharmacy from previous balance -Angelica Hopkins has never been without her medication but it has been a challenge to try to advocate continuing to get it.    We spent time discussing in detail other stressors at home, and personal life and relationships.  Recently separated place. When Angelica Hopkins gets very anxious like this Angelica Hopkins has trouble eating.  Angelica Hopkins is hopeful that I can recommend something for appetite stimulation for her.  Angelica Hopkins would like to avoid any sort of medication that could potentially have a neuropsychiatric side effect.    Review of Systems  Constitutional:  Negative for chills and fever.  HENT:  Negative for tinnitus.   Eyes:  Negative for blurred vision and photophobia.  Respiratory:  Negative for cough and sputum production.   Cardiovascular:  Negative for chest pain.  Gastrointestinal:  Negative for diarrhea, nausea and vomiting.  Genitourinary:  Negative for dysuria.  Skin:  Negative for rash.  Neurological:  Negative for headaches.  Psychiatric/Behavioral:  The patient is nervous/anxious.      Past Medical  History:  Diagnosis Date   Abnormal Pap smear of cervix    LSISL, colposcopy 2012   Asthma    inhaler prescribed by PCP, pt states occasional SOB due to asthma   Cellulitis and abscess of buttock 10/22/2019   treated w/ incision and drainage   Depression    no current meds as of 05/28/22   Fibroids    uterine fibroids   Hepatitis B infection    Follows with [facility] for infectious disease, LOV 02/20/22 as of 05/27/22.   HIV (human immunodeficiency virus infection) ([place]) 2005   Follows w/ [facility] for Infectious disease, LOV 02/20/22 as of 05/27/22.   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  Vaping Use   Vaping  Use: Former   Substances: Nicotine, THC, CBD, Mixture of cannabinoids  Substance Use Topics   Alcohol use: Yes    Alcohol/week: 10.0 standard drinks of alcohol    Types: 10 Cans of beer per week   Drug use: Not Currently   Outpatient Medications Prior to Visit  Medication Sig Dispense Refill   albuterol (VENTOLIN HFA) 108 (90 Base) MCG/ACT inhaler Inhale 1-2 puffs into the lungs every 6 (six) hours as needed for wheezing or shortness of breath. (Patient taking differently: Inhale 2-3 puffs into the lungs as needed for wheezing or shortness of breath.) 18 g 3   ibuprofen (ADVIL) 600 MG tablet Take 1 tablet (600 mg total) by mouth every 6 (six) hours as needed. 30 tablet 2   bictegravir-emtricitabine-tenofovir AF (BIKTARVY) 50-200-25 MG TABS tablet Take one tablet  daily 30 tablet 11   hydrOXYzine (ATARAX) 25 MG tablet Take by mouth.     docusate sodium (COLACE) 100 MG capsule Take 1 capsule (100 mg total) by mouth 2 (two) times daily. 10 capsule 0   ferrous sulfate 325 (65 FE) MG tablet Take 1 tablet (325 mg total) by mouth every other day. 60 tablet 2   oxyCODONE (OXY IR/ROXICODONE) 5 MG immediate release tablet Take 1-2 tablets (5-10 mg total) by mouth every 4 (four) hours as needed for moderate pain. 10 tablet 0   No facility-administered medications prior to visit.   Allergies  Allergen Reactions   Hydrocodone-Acetaminophen Itching    Patient reported   Kiwi Extract Other (See Comments)    Tongue gets numb and tingling.       OBJECTIVE: Vitals:   08/15/22 1508  BP: (!) 145/83  Pulse: 71  Temp: 98.3 F (36.8 C)  TempSrc: Oral  SpO2: 99%  Weight: 119 lb 12.8 oz (54.3 kg)   Body mass index is 20.56 kg/m.   Physical Exam Constitutional:      Appearance: Normal appearance. Angelica Hopkins is not ill-appearing.  HENT:     Mouth/Throat:     Mouth: Mucous membranes are moist.     Pharynx: Oropharynx is clear.  Eyes:     General: No scleral icterus. Cardiovascular:     Rate and Rhythm: Normal rate.  Pulmonary:     Effort: Pulmonary effort is normal.  Neurological:     Mental Status: Angelica Hopkins is oriented to person, place, and time.  Psychiatric:        Mood and Affect: Mood normal.        Thought Content: Thought content normal.      HIV 1 RNA Quant (Copies/mL)  Date Value  02/20/2022 Not Detected  07/11/2021 Not Detected  11/30/2020 <20 (H)   CD4 T Cell Abs (/uL)  Date Value  08/15/2022 1,001  02/20/2022 890  07/11/2021 1,411    Lab Results  Component Value Date   HAV NON-REACTIVE 05/12/2017   HBEAG NON-REACTIVE 07/11/2021   HEPCAB NON-REACTIVE 05/12/2017   Lab Results  Component Value Date   WBC 8.2 07/10/2022   HGB 11.9 07/10/2022   HCT 36.9 07/10/2022   MCV 87 07/10/2022   PLT 249 07/10/2022   Lab Results   Component Value Date   CREATININE 0.75 06/05/2022   CREATININE 0.70 06/05/2022   CREATININE 0.83 02/20/2022   Lab Results  Component Value Date   ALT 18 08/15/2022   AST 23 08/15/2022   GGT 10 02/20/2022   ALKPHOS 27 (L) 01/11/2022   BILITOT 0.4 08/15/2022    Problem List Items Addressed This  Visit       High   Human immunodeficiency virus (HIV) disease ([place])    Well-controlled on once daily Biktarvy.  Will update routine labs and ensure Angelica Hopkins has adequate supply of her medication available to her.  Will get her a [place] patient assistance co-pay card.  Deferred vaccinations.  Pap smear is up-to-date.  Angelica Hopkins is post vaginal hysterectomy.  Probably recommend a vaginal inspection and vaginal cytology sometime in the next year. Return in about 6 months (around 02/13/2023).        Relevant Medications   bictegravir-emtricitabine-tenofovir AF (BIKTARVY) 50-200-25 MG TABS tablet   Other Relevant Orders   HIV 1 RNA quant-no reflex-bld   T-helper cells (CD4) count (Completed)     Unprioritized   Anxiety - Primary    Will try lower dose of hydroxyzine 25 mg as needed 3 times daily.  Ambulatory referral sent for counseling services as well.  Angelica Hopkins has good success when Angelica Hopkins is in counseling.  Angelica Hopkins would prefer to avoid any SSRIs/TCA/SNRI's for management.      Relevant Medications   hydrOXYzine (ATARAX) 25 MG tablet   Other Relevant Orders   Ambulatory referral to Psychology   Hepatitis B infection    Hep B surface antigen remains positive but DNA levels are very low.  FibroTest was collected recently.  Will start ultrasound screenings after [address] [place].  Pertinent labs ordered today for monitoring.      Relevant Medications   bictegravir-emtricitabine-tenofovir AF (BIKTARVY) 50-200-25 MG TABS tablet   Other Relevant Orders   Hepatitis B DNA, ultraquantitative, PCR   Hepatic function panel (Completed)   Poor appetite    Will start Megace daily.      Janene Madeira, MSN,  NP-C [facility] for Infectious Disease [email or web address] Pager: [phone] Office: [phone] [place]: [phone]

## 2022-08-15 NOTE — Patient Instructions (Addendum)
Will send the [facility]tar[facility] to [facility] on [address] for you to pick up locally. You can call to transfer the other prescriptions to that pharmacy as well so you only deal with 1.   Megace - take 5 mL once a day to help with appetite.   Will place a referral for you to see a counselor [facility] and [place], [place]

## 2022-08-16 DIAGNOSIS — R630 Anorexia: Secondary | ICD-10-CM | POA: Insufficient documentation

## 2022-08-16 LAB — T-HELPER CELLS (CD4) COUNT (NOT AT ARMC)
CD4 % Helper T Cell: 45 % (ref 33–65)
CD4 T Cell Abs: 1001 /uL (ref 400–1790)

## 2022-08-16 NOTE — Assessment & Plan Note (Signed)
Well-controlled on once daily Biktarvy.  Will update routine labs and ensure she has adequate supply of her medication available to her.  Will get her a [place] patient assistance co-pay card.  Deferred vaccinations.  Pap smear is up-to-date.  She is post vaginal hysterectomy.  Probably recommend a vaginal inspection and vaginal cytology sometime in the next year. Return in about 6 months (around 02/13/2023).

## 2022-08-16 NOTE — Assessment & Plan Note (Signed)
Will try lower dose of hydroxyzine 25 mg as needed 3 times daily.  Ambulatory referral sent for counseling services as well.  She has good success when she is in counseling.  She would prefer to avoid any SSRIs/TCA/SNRI's for management.

## 2022-08-16 NOTE — Assessment & Plan Note (Signed)
Will start Megace daily.

## 2022-08-16 NOTE — Assessment & Plan Note (Signed)
Hep B surface antigen remains positive but DNA levels are very low.  FibroTest was collected recently.  Will start ultrasound screenings after [address] [place].  Pertinent labs ordered today for monitoring.

## 2022-08-19 LAB — HEPATIC FUNCTION PANEL
AG Ratio: 1.3 (calc) (ref 1.0–2.5)
ALT: 18 U/L (ref 6–29)
AST: 23 U/L (ref 10–30)
Albumin: 4.5 g/dL (ref 3.6–5.1)
Alkaline phosphatase (APISO): 29 U/L — ABNORMAL LOW (ref 31–125)
Bilirubin, Direct: 0.1 mg/dL (ref 0.0–0.2)
Globulin: 3.4 g/dL (calc) (ref 1.9–3.7)
Indirect Bilirubin: 0.3 mg/dL (calc) (ref 0.2–1.2)
Total Bilirubin: 0.4 mg/dL (ref 0.2–1.2)
Total Protein: 7.9 g/dL (ref 6.1–8.1)

## 2022-08-19 LAB — HEPATITIS B DNA, ULTRAQUANTITATIVE, PCR
Hepatitis B DNA: <10 IU/mL — ABNORMAL HIGH
Hepatitis B virus DNA: <1 Log IU/mL — ABNORMAL HIGH

## 2022-08-19 LAB — HIV-1 RNA QUANT-NO REFLEX-BLD
HIV 1 RNA Quant: <20 Copies/mL — ABNORMAL HIGH
HIV-1 RNA Quant, Log: <1.3 Log cps/mL — ABNORMAL HIGH

## 2022-09-02 ENCOUNTER — Other Ambulatory Visit: Payer: Self-pay

## 2022-09-02 ENCOUNTER — Ambulatory Visit (HOSPITAL_COMMUNITY)
Admission: EM | Admit: 2022-09-02 | Discharge: 2022-09-02 | Disposition: A | Discharge status: Home / Self Care | Payer: BC Managed Care – PPO | Attending: Internal Medicine | Admitting: Internal Medicine

## 2022-09-02 ENCOUNTER — Encounter (HOSPITAL_COMMUNITY): Payer: Self-pay | Admitting: *Deleted

## 2022-09-02 ENCOUNTER — Ambulatory Visit (INDEPENDENT_AMBULATORY_CARE_PROVIDER_SITE_OTHER): Payer: BC Managed Care – PPO

## 2022-09-02 DIAGNOSIS — R0602 Shortness of breath: Secondary | ICD-10-CM

## 2022-09-02 DIAGNOSIS — J069 Acute upper respiratory infection, unspecified: Secondary | ICD-10-CM

## 2022-09-02 LAB — POC INFLUENZA A AND B ANTIGEN (URGENT CARE ONLY)
INFLUENZA A ANTIGEN, POC: NEGATIVE
INFLUENZA B ANTIGEN, POC: NEGATIVE

## 2022-09-02 LAB — POCT RAPID STREP A, ED / UC: Streptococcus, Group A Screen (Direct): NEGATIVE

## 2022-09-02 MED ORDER — IBUPROFEN 800 MG PO TABS
ORAL_TABLET | ORAL | Status: AC
  Filled 2022-09-02: qty 1

## 2022-09-02 MED ORDER — IBUPROFEN 800 MG PO TABS
800.0000 mg | ORAL_TABLET | Freq: Once | ORAL | Status: AC
  Administered 2022-09-02: 800 mg via ORAL

## 2022-09-02 MED ORDER — BENZONATATE 100 MG PO CAPS: 100.0000 mg | ORAL_CAPSULE | Freq: Three times a day (TID) | ORAL | 0 refills | Status: DC

## 2022-09-02 NOTE — ED Triage Notes (Signed)
Pt reports feeling dizzy and a HA since Sat. Pt reports using her HHN . Pt denies asthma flare up but reports she is just sick.

## 2022-09-02 NOTE — ED Provider Notes (Signed)
[place]    CSN: [id number] Arrival date & time: 09/02/22  1504      History   Chief Complaint Chief Complaint  Patient presents with   Headache   Sore Throat    HPI Angelica Hopkins is a 42 y.o. female.   Patient presents today with a 2-day history of URI symptoms including cough, fever, congestion, headache, dizziness, malaise.  She denies any chest pain, nausea, vomiting, diarrhea.  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  She has not had COVID in the past.  She has had COVID-19 vaccinations.  She does not receive annual influenza vaccine.  She does have a history of asthma and has been using her inhaler more frequently since symptoms began.  She is also been taking ibuprofen with minimal improvement of symptoms.  Denies any recent antibiotics or steroids.  She does have a history of HIV and was last seen by infectious disease 08/15/2022 at which point she did have detectable viral load but had normal CD4 levels.    Past Medical History:  Diagnosis Date   Abnormal Pap smear of cervix    LSISL, colposcopy 2012   Asthma    inhaler prescribed by PCP, pt states occasional SOB due to asthma   Cellulitis and abscess of buttock 10/22/2019   treated w/ incision and drainage   Depression    no current meds as of 05/28/22   Fibroids    uterine fibroids   Hepatitis B infection    Follows with [facility] for infectious disease, LOV 02/20/22 as of 05/27/22.   HIV (human immunodeficiency virus infection) ([place]) 2005   Follows w/ [facility] for Infectious disease, LOV 02/20/22 as of 05/27/22.   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   Migraines 07/18/2017   Hepatitis B infection 11/15/2015   Screening examination for sexually transmitted disease 09/14/2014   Neuropathy 05/28/2011   Anxiety 01/25/2010   Human immunodeficiency virus (HIV) disease ([place]) 11/11/2008    Past Surgical History:  Procedure Laterality Date   APPENDECTOMY     around 2000   CESAREAN SECTION  2003   one previous   RADIOACTIVE SEED GUIDED EXCISIONAL BREAST BIOPSY Left 03/04/2019   Procedure: RADIOACTIVE SEED GUIDED EXCISIONAL LEFT BREAST BIOPSY AND EXCISION OF LEFT BREAST MASS;  Surgeon: [name], MD;  Location: [place];  Service: General;  Laterality: Left;   VAGINAL HYSTERECTOMY N/A 06/05/2022   Procedure: HYSTERECTOMY VAGINAL;  Surgeon: Griffin Basil, MD;  Location: [facility];  Service: Gynecology;  Laterality: N/A;    OB History     Gravida  2   Para  1   Term  1   Preterm  0   AB  1  Living  1      SAB  1   IAB  0   Ectopic  0   Multiple  0   Live Births  1            Home Medications    Prior to Admission medications   Medication Sig Start Date End Date Taking? Authorizing Provider  benzonatate (TESSALON) 100 MG capsule Take 1 capsule (100 mg total) by mouth every 8 (eight) hours. 09/02/22  Yes Alayzia Pavlock K, PA-C  albuterol (VENTOLIN HFA) 108 (90 Base) MCG/ACT inhaler Inhale 1-2 puffs into the lungs every 6 (six) hours as needed for wheezing or shortness of breath. Patient taking differently: Inhale 2-3 puffs into the lungs as needed for wheezing or shortness of breath. 06/13/21   Jaynee Eagles, PA-C  bictegravir-emtricitabine-tenofovir AF (BIKTARVY)  50-200-25 MG TABS tablet Take one tablet daily 08/15/22   [place] Callas, NP  hydrOXYzine (ATARAX) 25 MG tablet Take 1 tablet (25 mg total) by mouth every 8 (eight) hours as needed for anxiety. 08/15/22   [place] Callas, NP  ibuprofen (ADVIL) 600 MG tablet Take 1 tablet (600 mg total) by mouth every 6 (six) hours as needed. 06/06/22   Griffin Basil, MD  megestrol (MEGACE ES) 625 MG/5ML suspension Take 5 mLs (625 mg total) by mouth daily. 08/15/22   [place] Callas, NP    Family History Family History  Problem Relation Age of Onset   Uterine cancer Maternal Aunt    Lung cancer Maternal Grandmother    Liver disease Mother    HIV Mother    Drug abuse Mother    HIV Father    Diabetes Father    Drug abuse Father    Healthy Brother    Breast cancer Paternal Aunt    Stomach cancer Paternal Grandmother    Healthy Sister    Healthy Brother    Healthy Sister    Healthy Daughter    Colon cancer Neg Hx     Social History Social History   Tobacco Use   Smoking status: Every Day    Packs/day: 0.33    Years: 15.00    Total pack years: 4.95    Types: Cigarettes   Smokeless tobacco: Never   Tobacco comments:    Patient quit smoking for a couple of years and then started back.  Vaping Use   Vaping Use: Former   Substances: Nicotine, THC, CBD, Mixture of cannabinoids  Substance Use Topics   Alcohol use: Yes    Alcohol/week: 10.0 standard drinks of alcohol    Types: 10 Cans of beer per week   Drug use: Not Currently     Allergies   Hydrocodone-acetaminophen and Kiwi extract   Review of Systems Review of Systems  Constitutional:  Positive for activity change, chills, fatigue and fever. Negative for appetite change.  HENT:  Positive for congestion and sore throat. Negative for sinus pressure and sneezing.   Respiratory:  Positive for cough and shortness of breath.   Cardiovascular:  Negative for chest pain.  Gastrointestinal:  Negative for abdominal pain, diarrhea,  nausea and vomiting.  Neurological:  Positive for dizziness and headaches. Negative for light-headedness.     Physical Exam Triage Vital Signs ED Triage Vitals  Enc Vitals Group     BP 09/02/22 1522 123/77     Pulse Rate 09/02/22 1522 (!) 55     Resp 09/02/22 1522 20     Temp 09/02/22 1522 (!) 101.7 F (  38.7 C)     Temp src --      SpO2 09/02/22 1522 99 %     Weight --      Height --      Head Circumference --      Peak Flow --      Pain Score 09/02/22 1520 6     Pain Loc --      Pain Edu? --      Excl. in [place]? --    No data found.  Updated Vital Signs BP 118/80 (BP Location: Left Arm)   Pulse 82   Temp 99.5 F (37.5 C) (Oral)   Resp 16   LMP 05/30/2022 Comment: starting spotting  SpO2 98%   Visual Acuity Right Eye Distance:   Left Eye Distance:   Bilateral Distance:    Right Eye Near:   Left Eye Near:    Bilateral Near:     Physical Exam Vitals reviewed.  Constitutional:      General: She is awake. She is not in acute distress.    Appearance: Normal appearance. She is well-developed. She is not ill-appearing.     Comments: Very pleasant female stated age laying on exam room table in no acute distress  HENT:     Head: Normocephalic and atraumatic.     Right Ear: Tympanic membrane, ear canal and external ear normal. Tympanic membrane is not erythematous or bulging.     Left Ear: Tympanic membrane, ear canal and external ear normal. Tympanic membrane is not erythematous or bulging.     Nose:     Right Sinus: No maxillary sinus tenderness or frontal sinus tenderness.     Left Sinus: No maxillary sinus tenderness or frontal sinus tenderness.     Mouth/Throat:     Pharynx: Uvula midline. No oropharyngeal exudate or posterior oropharyngeal erythema.     Tonsils: No tonsillar exudate or tonsillar abscesses. 2+ on the right. 2+ on the left.  Cardiovascular:     Rate and Rhythm: Normal rate and regular rhythm.     Heart sounds: Normal heart sounds, S1 normal and  S2 normal. No murmur heard. Pulmonary:     Effort: Pulmonary effort is normal.     Breath sounds: Examination of the right-lower field reveals decreased breath sounds. Examination of the left-lower field reveals decreased breath sounds. Decreased breath sounds present. No wheezing, rhonchi or rales.  Psychiatric:        Behavior: Behavior is cooperative.      UC Treatments / Results  Labs (all labs ordered are listed, but only abnormal results are displayed) Labs Reviewed  POC INFLUENZA A AND B ANTIGEN (URGENT CARE ONLY)  POCT RAPID STREP A, ED / UC    EKG   Radiology DG Chest 2 View  Result Date: 09/02/2022 CLINICAL DATA:  Shortness of breath EXAM: CHEST - 2 VIEW COMPARISON:  Chest x-ray August 04, 2018 FINDINGS: The heart size and mediastinal contours are within normal limits. Both lungs are clear. The visualized skeletal structures are unremarkable. IMPRESSION: No active cardiopulmonary disease. Electronically Signed   By: Dorise Bullion III M.D.   On: 09/02/2022 17:36    Procedures Procedures (including critical care time)  Medications Ordered in UC Medications  ibuprofen (ADVIL) tablet 800 mg (800 mg Oral Given 09/02/22 1539)    Initial Impression / Assessment and Plan / UC Course  I have reviewed the triage vital signs and the nursing notes.  Pertinent labs & imaging results that were available  during my care of the patient were reviewed by me and considered in my medical decision making (see chart for details).     Patient was initially febrile but given ibuprofen manage milligrams with normalization of temperature.  She is otherwise well-appearing, nontoxic, nontachycardic.  Flu testing was negative in clinic today.  Chest x-ray was obtained given history of HIV which showed no acute cardiopulmonary disease.  Patient requested strep testing even though we discussed that generally you do not have congestion/cough with strep.  Strep testing was negative.  Offered COVID  testing but patient declined this stating that she had already been here for a long time and was very hungry and so was not going to allow [place] to do anything else including swab and send out for COVID.  Discussed that ultimately this is likely a virus.  Recommended conservative treatment measures.  She was provided Tessalon for cough.  Recommend that she rest and drink plenty of fluid.  She was provided a work excuse note.  Discussed that if she has any persistent or worsening symptoms she should be seen again.  Final Clinical Impressions(s) / UC Diagnoses   Final diagnoses:  Upper respiratory tract infection, unspecified type     Discharge Instructions      You tested negative for flu and strep.  I believe you have another virus.  Alternate Tylenol and ibuprofen for fever and pain.  Use Tessalon for cough.  You can use over-the-counter medications for additional symptom relief.  Make sure that you rest and drink plenty of fluid.  If your symptoms or not improving within a week return for reevaluation.  If anything worsens and you have high fever not responding to medication, chest pain, shortness of breath, persistent cough you should be seen again.     ED Prescriptions     Medication Sig Dispense Auth. Provider   benzonatate (TESSALON) 100 MG capsule Take 1 capsule (100 mg total) by mouth every 8 (eight) hours. 21 capsule Aideen Fenster K, PA-C      PDMP not reviewed this encounter.   Terrilee Croak, PA-C 09/02/22 [id number]

## 2022-09-02 NOTE — Discharge Instructions (Signed)
You tested negative for flu and strep.  I believe you have another virus.  Alternate Tylenol and ibuprofen for fever and pain.  Use Tessalon for cough.  You can use over-the-counter medications for additional symptom relief.  Make sure that you rest and drink plenty of fluid.  If your symptoms or not improving within a week return for reevaluation.  If anything worsens and you have high fever not responding to medication, chest pain, shortness of breath, persistent cough you should be seen again.

## 2022-09-04 ENCOUNTER — Telehealth: Payer: BC Managed Care – PPO | Admitting: Emergency Medicine

## 2022-09-04 DIAGNOSIS — J069 Acute upper respiratory infection, unspecified: Secondary | ICD-10-CM

## 2022-09-04 DIAGNOSIS — J329 Chronic sinusitis, unspecified: Secondary | ICD-10-CM

## 2022-09-04 MED ORDER — FLUTICASONE PROPIONATE 50 MCG/ACT NA SUSP: 2.0000 | Freq: Every day | NASAL | 0 refills | Status: DC

## 2022-09-04 MED ORDER — IPRATROPIUM BROMIDE 0.03 % NA SOLN: 2.0000 | Freq: Two times a day (BID) | NASAL | 0 refills | Status: DC

## 2022-09-04 NOTE — Progress Notes (Signed)
E-Visit for Upper Respiratory Infection   We are sorry you are not feeling well.  Here is how we plan to help!  Based on what you have shared with me, it looks like you may have a viral upper respiratory infection.  Upper respiratory infections are caused by a large number of viruses; however, rhinovirus is the most common cause.   Symptoms vary from person to person, with common symptoms including sore throat, cough, fatigue or lack of energy and feeling of general discomfort.  A low-grade fever of up to 100.4 may present, but is often uncommon.  Symptoms vary however, and are closely related to a person's age or underlying illnesses.  The most common symptoms associated with an upper respiratory infection are nasal discharge or congestion, cough, sneezing, headache and pressure in the ears and face.  These symptoms usually persist for about 3 to 10 days, but can last up to 2 weeks.  It is important to know that upper respiratory infections do not cause serious illness or complications in most cases.    Upper respiratory infections can be transmitted from person to person, with the most common method of transmission being a person's hands.  The virus is able to live on the skin and can infect other persons for up to 2 hours after direct contact.  Also, these can be transmitted when someone coughs or sneezes; thus, it is important to cover the mouth to reduce this risk.  To keep the spread of the illness at [place], good hand hygiene is very important.  This is an infection that is most likely caused by a virus. There are no specific treatments other than to help you with the symptoms until the infection runs its course.  We are sorry you are not feeling well.  Here is how we plan to help!   For nasal congestion, you may use an oral decongestants such as Mucinex D or if you have glaucoma or high blood pressure use plain Mucinex.  Saline nasal spray or nasal drops can help and can safely be used as often as  needed for congestion.  For your congestion, I have prescribed Fluticasone nasal spray one spray in each nostril twice a day  If you do not have a history of heart disease, hypertension, diabetes or thyroid disease, prostate/bladder issues or glaucoma, you may also use Sudafed to treat nasal congestion.  It is highly recommended that you consult with a pharmacist or your primary care physician to ensure this medication is safe for you to take.     If you have a cough, you may use cough suppressants such as Delsym and Robitussin.  If you have glaucoma or high blood pressure, you can also use Coricidin HBP.   You can continue taking the Tessalon Perles prescribed at urgent care, or you can take over-the-counter Delsym.  If you have a sore or scratchy throat, use a saltwater gargle-  to  teaspoon of salt dissolved in a 4-ounce to 8-ounce glass of warm water.  Gargle the solution for approximately 15-30 seconds and then spit.  It is important not to swallow the solution.  You can also use throat lozenges/cough drops and Chloraseptic spray to help with throat pain or discomfort.  Warm or cold liquids can also be helpful in relieving throat pain.  For headache, pain or general discomfort, you can use Ibuprofen or Tylenol as directed.   Some authorities believe that zinc sprays or the use of Echinacea may shorten the  course of your symptoms.   HOME CARE Only take medications as instructed by your medical team. Be sure to drink plenty of fluids. Water is fine as well as fruit juices, sodas and electrolyte beverages. You may want to stay away from caffeine or alcohol. If you are nauseated, try taking small sips of liquids. How do you know if you are getting enough fluid? Your urine should be a pale yellow or almost colorless. Get rest. Taking a steamy shower or using a humidifier may help nasal congestion and ease sore throat pain. You can place a towel over your head and breathe in the steam from hot  water coming from a faucet. Using a saline nasal spray works much the same way. Cough drops, hard candies and sore throat lozenges may ease your cough. Avoid close contacts especially the very young and the elderly Cover your mouth if you cough or sneeze Always remember to wash your hands.   GET HELP RIGHT AWAY IF: You develop worsening fever. If your symptoms do not improve within 10 days You develop yellow or green discharge from your nose over 3 days. You have coughing fits You develop a severe head ache or visual changes. You develop shortness of breath, difficulty breathing or start having chest pain Your symptoms persist after you have completed your treatment plan  MAKE SURE YOU  Understand these instructions. Will watch your condition. Will get help right away if you are not doing well or get worse.  Thank you for choosing an e-visit.  Your e-visit answers were reviewed by a board certified advanced clinical practitioner to complete your personal care plan. Depending upon the condition, your plan could have included both over the counter or prescription medications.  Please review your pharmacy choice. Make sure the pharmacy is open so you can pick up prescription now. If there is a problem, you may contact your provider through [employer] and have the prescription routed to another pharmacy.  Your safety is important to [place]. If you have drug allergies check your prescription carefully.   For the next 24 hours you can use MyChart to ask questions about today's visit, request a non-urgent call back, or ask for a work or school excuse. You will get an email in the next two days asking about your experience. I hope that your e-visit has been valuable and will speed your recovery.    Approximately 5 minutes was used in reviewing the patient's chart, questionnaire, prescribing medications, and documentation.

## 2022-09-04 NOTE — Progress Notes (Signed)
Virtual Visit Consent   Angelica Hopkins, you are scheduled for a virtual visit with a [facility] provider today. Just as with appointments in the office, your consent must be obtained to participate. Your consent will be active for this visit and any virtual visit you may have with one of our providers in the next 365 days. If you have a MyChart account, a copy of this consent can be sent to you electronically.  As this is a virtual visit, video technology does not allow for your provider to perform a traditional examination. This may limit your provider's ability to fully assess your condition. If your provider identifies any concerns that need to be evaluated in person or the need to arrange testing (such as labs, EKG, etc.), we will make arrangements to do so. Although advances in technology are sophisticated, we cannot ensure that it will always work on either your end or our end. If the connection with a video visit is poor, the visit may have to be switched to a telephone visit. With either a video or telephone visit, we are not always able to ensure that we have a secure connection.  By engaging in this virtual visit, you consent to the provision of healthcare and authorize for your insurance to be billed (if applicable) for the services provided during this visit. Depending on your insurance coverage, you may receive a charge related to this service.  I need to obtain your verbal consent now. Are you willing to proceed with your visit today? Angelica Hopkins has provided verbal consent on 09/04/2022 for a virtual visit (video or telephone). Angelica Circle, PA-C  Date: 09/04/2022 10:52 AM  Virtual Visit via Video Note   I, Angelica Hopkins, connected with  Angelica Hopkins  ([id number], 07-Aug-1980) on 09/04/22 at 10:45 AM EST by a video-enabled telemedicine application and verified that I am speaking with the correct person using two identifiers.  Location: Patient: Virtual Visit Location  Patient: Home Provider: Virtual Visit Location Provider: Home Office   I discussed the limitations of evaluation and management by telemedicine and the availability of in person appointments. The patient expressed understanding and agreed to proceed.    History of Present Illness: Angelica Hopkins is a 42 y.o. who identifies as a female who was assigned female at birth, and is being seen today for headache, congestion.  Onset 4 days ago.  States that she has been having worsening symptoms.  States that she was seen at urgent care and had negative flu and strep.  States that she has been running low grade fevers.  Reports sinus headache and congestion.  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   Medications:  Current Outpatient Medications:    ipratropium (ATROVENT) 0.03 % nasal spray, Place 2 sprays  into both nostrils every 12 (twelve) hours., Disp: 30 mL, Rfl: 0   albuterol (VENTOLIN HFA) 108 (90 Base) MCG/ACT inhaler, Inhale 1-2 puffs into the lungs every 6 (six) hours as needed for wheezing or shortness of breath. (Patient taking differently: Inhale 2-3 puffs into the lungs as needed for wheezing or shortness of breath.), Disp: 18 g, Rfl: 3   benzonatate (TESSALON) 100 MG  capsule, Take 1 capsule (100 mg total) by mouth every 8 (eight) hours., Disp: 21 capsule, Rfl: 0   bictegravir-emtricitabine-tenofovir AF (BIKTARVY) 50-200-25 MG TABS tablet, Take one tablet daily, Disp: 30 tablet, Rfl: 11   fluticasone (FLONASE) 50 MCG/ACT nasal spray, Place 2 sprays into both nostrils daily., Disp: 9.9 mL, Rfl: 0   hydrOXYzine (ATARAX) 25 MG tablet, Take 1 tablet (25 mg total) by mouth every 8 (eight) hours as needed for anxiety., Disp: 30 tablet, Rfl: 5   ibuprofen (ADVIL) 600 MG tablet, Take 1 tablet (600 mg total) by mouth every 6 (six) hours as needed., Disp: 30 tablet, Rfl: 2   megestrol (MEGACE ES) 625 MG/5ML suspension, Take 5 mLs (625 mg total) by mouth daily., Disp: 150 mL, Rfl: 0  Observations/Objective: Patient is well-developed, well-nourished in no acute distress.  Resting comfortably at home.  Head is normocephalic, atraumatic.  No labored breathing.  Speech is clear and coherent with logical content.  Patient is alert and oriented at baseline.  Non-toxic appearing  Assessment and Plan: 1. Sinusitis, unspecified chronicity, unspecified location  Meds ordered this encounter  Medications   ipratropium (ATROVENT) 0.03 % nasal spray    Sig: Place 2 sprays into both nostrils every 12 (twelve) hours.    Dispense:  30 mL    Refill:  0    Order Specific Question:   Supervising Provider    Answer:   Chase Picket [id number]    Patient with sinus congestion, URI symptoms, and headache.  Discussed that symptoms are likely viral.  She is compliant on her biktarvy and has low viral load and normal CD4 count.  Low suspicion for meningitis, especially with well appearance on camera.   Will give atrovent nasal spray.  Recommend continuing with supportive care.  Follow Up Instructions: I discussed the assessment and treatment plan with the patient. The patient was provided an opportunity to ask questions and all were answered. The patient agreed with the plan and  demonstrated an understanding of the instructions.  A copy of instructions were sent to the patient via MyChart unless otherwise noted below.     The patient was advised to call back or seek an in-person evaluation if the symptoms worsen or if the condition fails to improve as anticipated.  Time:  I spent 10 minutes with the patient via telehealth technology discussing the above problems/concerns.    Angelica Circle, PA-C

## 2022-09-04 NOTE — Patient Instructions (Signed)
[name], thank you for joining Montine Circle, PA-C for today's virtual visit.  While this provider is not your primary care provider (PCP), if your PCP is located in our provider database this encounter information will be shared with them immediately following your visit.   [facility] account gives you access to today's visit and all your visits, tests, and labs performed at [facility] " click here if you don't have a [facility] account or go to [email or web address]  Consent: (Patient) Angelica Hopkins provided verbal consent for this virtual visit at the beginning of the encounter.  Current Medications:  Current Outpatient Medications:    ipratropium (ATROVENT) 0.03 % nasal spray, Place 2 sprays into both nostrils every 12 (twelve) hours., Disp: 30 mL, Rfl: 0   albuterol (VENTOLIN HFA) 108 (90 Base) MCG/ACT inhaler, Inhale 1-2 puffs into the lungs every 6 (six) hours as needed for wheezing or shortness of breath. (Patient taking differently: Inhale 2-3 puffs into the lungs as needed for wheezing or shortness of breath.), Disp: 18 g, Rfl: 3   benzonatate (TESSALON) 100 MG capsule, Take 1 capsule (100 mg total) by mouth every 8 (eight) hours., Disp: 21 capsule, Rfl: 0   bictegravir-emtricitabine-tenofovir AF (BIKTARVY) 50-200-25 MG TABS tablet, Take one tablet daily, Disp: 30 tablet, Rfl: 11   fluticasone (FLONASE) 50 MCG/ACT nasal spray, Place 2 sprays into both nostrils daily., Disp: 9.9 mL, Rfl: 0   hydrOXYzine (ATARAX) 25 MG tablet, Take 1 tablet (25 mg total) by mouth every 8 (eight) hours as needed for anxiety., Disp: 30 tablet, Rfl: 5   ibuprofen (ADVIL) 600 MG tablet, Take 1 tablet (600 mg total) by mouth every 6 (six) hours as needed., Disp: 30 tablet, Rfl: 2   megestrol (MEGACE ES) 625 MG/5ML suspension, Take 5 mLs (625 mg total) by mouth daily., Disp: 150 mL, Rfl: 0   Medications ordered in this encounter:  Meds ordered this encounter   Medications   ipratropium (ATROVENT) 0.03 % nasal spray    Sig: Place 2 sprays into both nostrils every 12 (twelve) hours.    Dispense:  30 mL    Refill:  0    Order Specific Question:   Supervising Provider    Answer:   Chase Picket [[id number]]     *If you need refills on other medications prior to your next appointment, please contact your pharmacy*  Follow-Up: Call back or seek an in-person evaluation if the symptoms worsen or if the condition fails to improve as anticipated.  [facility] [phone]  Other Instructions Take medications as prescribed.  Antibiotics might be considered around day 10 of illness, or if you have significantly worsening symptoms over the next few days.  Otherwise, this will likely need to run its course.   If you have been instructed to have an in-person evaluation today at a local Urgent Care facility, please use the link below. It will take you to a list of all of our available [facility], including address, phone number and hours of operation. Please do not delay care.  [facility]  If you or a family member do not have a primary care provider, use the link below to schedule a visit and establish care. When you choose a [facility] primary care physician or advanced practice provider, you gain a long-term partner in health. Find a Primary Care Provider  Learn more about 's in-office and virtual care options: [facility]

## 2022-09-13 ENCOUNTER — Other Ambulatory Visit: Payer: Self-pay | Admitting: Infectious Diseases

## 2022-09-13 NOTE — Telephone Encounter (Signed)
Please advise on refill.

## 2022-10-19 ENCOUNTER — Ambulatory Visit
Admission: RE | Admit: 2022-10-19 | Discharge: 2022-10-19 | Disposition: A | Discharge status: Home / Self Care | Payer: BC Managed Care – PPO | Source: Ambulatory Visit | Attending: Internal Medicine | Admitting: Internal Medicine

## 2022-10-19 VITALS — BP 104/71 | HR 97 | Temp 98.3°F | Resp 18 | Ht 64.0 in | Wt 121.0 lb

## 2022-10-19 DIAGNOSIS — J0140 Acute pansinusitis, unspecified: Secondary | ICD-10-CM | POA: Diagnosis not present

## 2022-10-19 MED ORDER — AMOXICILLIN-POT CLAVULANATE 875-125 MG PO TABS: 1.0000 | ORAL_TABLET | Freq: Two times a day (BID) | ORAL | 0 refills | Status: DC

## 2022-10-19 NOTE — ED Triage Notes (Signed)
Patient c/o nasal drainage, congestion, cough and left ear pain x 1 week.  Patient noticed after coming in from outside last week.  Patient has taken Claritin and OTC sinus headache.

## 2022-10-19 NOTE — ED Provider Notes (Signed)
[name]    CSN: [id number] Arrival date & time: 10/19/22  1049      History   Chief Complaint Chief Complaint  Patient presents with   Nasal Congestion    A week ago my allergies were bothering me. Now I'm still congested and having difficulty hearing. - Entered by patient    HPI Angelica Hopkins is a 42 y.o. female.   Patient presents with nasal drainage, nasal congestion, sinus pressure, nonproductive cough, ear pain that started about 1.5 weeks ago.  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  She has been taking over-the-counter Claritin with no improvement in symptoms.  Denies chest pain, shortness of breath, fever.  Denies any known sick contacts.     Past Medical History:  Diagnosis Date   Abnormal Pap smear of cervix    LSISL, colposcopy 2012   Asthma    inhaler prescribed by PCP, pt states occasional SOB due to asthma   Cellulitis and abscess of buttock 10/22/2019   treated w/ incision and drainage   Depression    no current meds as of 05/28/22   Fibroids    uterine fibroids   Hepatitis B infection    Follows with [facility] for infectious disease, LOV 02/20/22 as of 05/27/22.   HIV (human immunodeficiency virus infection) ([place]) 2005   Follows w/ [facility] for Infectious disease, LOV 02/20/22 as of 05/27/22.   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) 11/11/2008    Past Surgical History:  Procedure Laterality Date   APPENDECTOMY     around 2000   CESAREAN SECTION  2003   one previous   RADIOACTIVE SEED GUIDED EXCISIONAL BREAST BIOPSY Left 03/04/2019   Procedure: RADIOACTIVE SEED GUIDED EXCISIONAL LEFT BREAST BIOPSY AND EXCISION OF LEFT BREAST MASS;  Surgeon: [name], MD;  Location: [facility];  Service: General;  Laterality: Left;   VAGINAL HYSTERECTOMY N/A 06/05/2022   Procedure: HYSTERECTOMY VAGINAL;  Surgeon: Griffin Basil, MD;  Location: [facility];  Service: Gynecology;  Laterality: N/A;    OB History     Gravida  2   Para  1   Term  1   Preterm  0   AB  1   Living  1      SAB  1   IAB  0   Ectopic  0   Multiple  0   Live Births  1            Home Medications    Prior to Admission medications   Medication Sig Start Date  End Date Taking? Authorizing Provider  albuterol (VENTOLIN HFA) 108 (90 Base) MCG/ACT inhaler Inhale 1-2 puffs into the lungs every 6 (six) hours as needed for wheezing or shortness of breath. Patient taking differently: Inhale 2-3 puffs into the lungs as needed for wheezing or shortness of breath. 06/13/21  Yes Jaynee Eagles, PA-C  amoxicillin-clavulanate (AUGMENTIN) 875-125 MG tablet Take 1 tablet by mouth every 12 (twelve) hours. 10/19/22  Yes Kamisha Ell, Hildred Alamin E, FNP  bictegravir-emtricitabine-tenofovir AF (BIKTARVY) 50-200-25 MG TABS tablet Take one tablet daily 08/15/22  Yes Dixon, Melton Krebs, NP  fluticasone (FLONASE) 50 MCG/ACT nasal spray Place 2 sprays into both nostrils daily. 09/04/22  Yes Montine Circle, PA-C  hydrOXYzine (ATARAX) 25 MG tablet Take 1 tablet (25 mg total) by mouth every 8 (eight) hours  as needed for anxiety. 08/15/22  Yes [place] Callas, NP  ibuprofen (ADVIL) 600 MG tablet Take 1 tablet (600 mg total) by mouth every 6 (six) hours as needed. 06/06/22  Yes Griffin Basil, MD  ipratropium (ATROVENT) 0.03 % nasal spray Place 2 sprays into both nostrils every 12 (twelve) hours. 09/04/22  Yes Montine Circle, PA-C  megestrol (MEGACE ES) 625 MG/5ML suspension SHAKE LIQUID AND TAKE 5 ML(625 MG) BY MOUTH DAILY 09/13/22  Yes Natoma Callas, NP  benzonatate (TESSALON) 100 MG capsule Take 1 capsule (100 mg total) by mouth every 8 (eight) hours. 09/02/22   Raspet, Derry Skill, PA-C    Family History Family History  Problem Relation Age of Onset   Uterine cancer Maternal Aunt    Lung cancer Maternal Grandmother    Liver disease Mother    HIV Mother    Drug abuse Mother    HIV Father    Diabetes Father    Drug abuse Father    Healthy Brother    Breast cancer Paternal Aunt    Stomach cancer Paternal Grandmother    Healthy Sister    Healthy Brother    Healthy Sister    Healthy Daughter    Colon cancer Neg Hx     Social History Social History   Tobacco Use   Smoking status: Every Day    Packs/day: 0.33    Years: 15.00    Additional pack years: 0.00    Total pack years: 4.95    Types: Cigarettes   Smokeless tobacco: Never   Tobacco comments:    Patient quit smoking for a couple of years and then started back.  Vaping Use   Vaping Use: Former   Substances: Nicotine, THC, CBD, Mixture of cannabinoids  Substance Use Topics   Alcohol use: Yes    Alcohol/week: 10.0 standard drinks of alcohol    Types: 10 Cans of beer per week   Drug use: Not Currently     Allergies   Hydrocodone-acetaminophen and Kiwi extract   Review of Systems Review of Systems Per HPI  Physical Exam Triage Vital Signs ED Triage Vitals  Enc Vitals Group     BP 10/19/22 1119 104/71     Pulse Rate 10/19/22 1119 97     Resp 10/19/22 1119 18     Temp 10/19/22 1119 98.3 F (36.8 C)     Temp  Source 10/19/22 1119 Oral     SpO2 10/19/22 1119 98 %     Weight 10/19/22 1121 121 lb (54.9 kg)     Height 10/19/22 1121 5\' 4"  (1.626 m)     Head Circumference --      Peak Flow --  Pain Score 10/19/22 1121 1     Pain Loc --      Pain Edu? --      Excl. in [place]? --    No data found.  Updated Vital Signs BP 104/71 (BP Location: Left Arm)   Pulse 97   Temp 98.3 F (36.8 C) (Oral)   Resp 18   Ht 5\' 4"  (1.626 m)   Wt 121 lb (54.9 kg)   LMP 05/30/2022 Comment: starting spotting  SpO2 98%   BMI 20.77 kg/m   Visual Acuity Right Eye Distance:   Left Eye Distance:   Bilateral Distance:    Right Eye Near:   Left Eye Near:    Bilateral Near:     Physical Exam Constitutional:      General: She is not in acute distress.    Appearance: Normal appearance. She is not toxic-appearing or diaphoretic.  HENT:     Head: Normocephalic and atraumatic.     Right Ear: Tympanic membrane and ear canal normal.     Left Ear: Tympanic membrane and ear canal normal.     Nose: Congestion present.     Mouth/Throat:     Mouth: Mucous membranes are moist.     Pharynx: No posterior oropharyngeal erythema.  Eyes:     Extraocular Movements: Extraocular movements intact.     Conjunctiva/sclera: Conjunctivae normal.     Pupils: Pupils are equal, round, and reactive to light.  Cardiovascular:     Rate and Rhythm: Normal rate and regular rhythm.     Pulses: Normal pulses.     Heart sounds: Normal heart sounds.  Pulmonary:     Effort: Pulmonary effort is normal. No respiratory distress.     Breath sounds: Normal breath sounds. No stridor. No wheezing, rhonchi or rales.  Abdominal:     General: Abdomen is flat. Bowel sounds are normal.     Palpations: Abdomen is soft.  Musculoskeletal:        General: Normal range of motion.     Cervical back: Normal range of motion.  Skin:    General: Skin is warm and dry.  Neurological:     General: No focal deficit present.     Mental Status: She is  alert and oriented to person, place, and time. Mental status is at baseline.  Psychiatric:        Mood and Affect: Mood normal.        Behavior: Behavior normal.      UC Treatments / Results  Labs (all labs ordered are listed, but only abnormal results are displayed) Labs Reviewed - No data to display  EKG   Radiology No results found.  Procedures Procedures (including critical care time)  Medications Ordered in UC Medications - No data to display  Initial Impression / Assessment and Plan / UC Course  I have reviewed the triage vital signs and the nursing notes.  Pertinent labs & imaging results that were available during my care of the patient were reviewed by me and considered in my medical decision making (see chart for details).     Physical exam is concerning for sinus infection.  Will treat with Augmentin.  Advised supportive care and symptom management.  Discussed return precautions.  Patient verbalized understanding and was agreeable with plan. Final Clinical Impressions(s) / UC Diagnoses   Final diagnoses:  Acute non-recurrent pansinusitis     Discharge Instructions      It appears that you have a sinus infection so I  am treating this with antibiotic.  Please follow-up if any symptoms persist or worsen.    ED Prescriptions     Medication Sig Dispense Auth. Provider   amoxicillin-clavulanate (AUGMENTIN) 875-125 MG tablet Take 1 tablet by mouth every 12 (twelve) hours. 14 tablet [place], Michele Rockers, [place]      PDMP not reviewed this encounter.   Teodora Medici, [place] 10/19/22 1228

## 2022-10-19 NOTE — Discharge Instructions (Signed)
It appears that you have a sinus infection so I am treating this with antibiotic.  Please follow-up if any symptoms persist or worsen. 

## 2022-11-15 ENCOUNTER — Encounter: Payer: Self-pay | Admitting: General Practice

## 2022-11-15 ENCOUNTER — Encounter: Payer: Self-pay | Admitting: Obstetrics and Gynecology

## 2022-11-20 ENCOUNTER — Encounter: Payer: Self-pay | Admitting: Obstetrics and Gynecology

## 2022-11-20 DIAGNOSIS — N6310 Unspecified lump in the right breast, unspecified quadrant: Secondary | ICD-10-CM

## 2022-11-20 DIAGNOSIS — Z87898 Personal history of other specified conditions: Secondary | ICD-10-CM

## 2022-12-04 ENCOUNTER — Ambulatory Visit
Admission: RE | Admit: 2022-12-04 | Discharge: 2022-12-04 | Disposition: A | Discharge status: Home / Self Care | Payer: BC Managed Care – PPO | Source: Ambulatory Visit | Attending: Obstetrics and Gynecology | Admitting: Obstetrics and Gynecology

## 2022-12-04 DIAGNOSIS — N6310 Unspecified lump in the right breast, unspecified quadrant: Secondary | ICD-10-CM

## 2022-12-04 DIAGNOSIS — Z87898 Personal history of other specified conditions: Secondary | ICD-10-CM

## 2022-12-24 ENCOUNTER — Encounter: Payer: Self-pay | Admitting: Infectious Diseases

## 2022-12-24 ENCOUNTER — Ambulatory Visit (INDEPENDENT_AMBULATORY_CARE_PROVIDER_SITE_OTHER): Payer: BC Managed Care – PPO | Admitting: Infectious Diseases

## 2022-12-24 ENCOUNTER — Other Ambulatory Visit: Payer: Self-pay

## 2022-12-24 VITALS — BP 118/80 | HR 70 | Temp 98.2°F | Ht 64.0 in | Wt 135.0 lb

## 2022-12-24 DIAGNOSIS — F419 Anxiety disorder, unspecified: Secondary | ICD-10-CM

## 2022-12-24 DIAGNOSIS — B181 Chronic viral hepatitis B without delta-agent: Secondary | ICD-10-CM | POA: Diagnosis not present

## 2022-12-24 DIAGNOSIS — B20 Human immunodeficiency virus [HIV] disease: Secondary | ICD-10-CM

## 2022-12-24 NOTE — Progress Notes (Unsigned)
Name: Angelica Hopkins  DOB: 07/08/1981  MRN: [id number] PCP: Patient, No Pcp Per      Subjective   Brief Narrative:  Angelica Hopkins is a 42 y.o. female with well controlled HIV, Dx 2005. Deferred treatment with ART x 5 years.  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   Previous Regimens:  Genvoya 2010 >> suppressed, inconsistent use  Biktarvy 2018 >> suppressed    Genotype:  04/2017 - sensitive     Chief Complaint  Patient presents with   Follow-up  HIV/HBV    HPI:  Angelica Hopkins is here today for routine follow up care.  She is doing well on her Biktarvy once daily and no concerns with any missed doses or access to her medication.  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 Dropped down to 2-3 times a week to level off the weight gain.  She is very happy with how she feels and looks physically.  She does have moments where she has moments where in between pay checks getting enough food in the house.  We spent time talking about this in greater detail.  She has been working with a therapist that I recommended and likes her a good bit.  She has a session scheduled later this evening at 6.    Review of Systems  Constitutional:  Negative for chills and fever.  HENT:  Negative for tinnitus.   Eyes:  Negative for blurred vision and photophobia.  Respiratory:  Negative for cough and sputum production.   Cardiovascular:  Negative for chest pain.  Gastrointestinal:  Negative for diarrhea, nausea and vomiting.  Genitourinary:  Negative for dysuria.  Skin:  Negative for rash.  Neurological:  Negative for headaches.  Psychiatric/Behavioral:  The patient is nervous/anxious.      Past Medical History:  Diagnosis Date   Abnormal Pap smear of cervix    LSISL, colposcopy 2012   Asthma    inhaler prescribed by PCP, pt states occasional SOB due to asthma   Cellulitis and abscess of  buttock 10/22/2019   treated w/ incision and drainage   Depression    no current meds as of 05/28/22   Fibroids    uterine fibroids   Hepatitis B infection    Follows [facility] for infectious disease, LOV 02/20/22 as of 05/27/22.   HIV (human immunodeficiency virus infection) (H[facility]) 2005   Follows w/ [facility] for Infectious disease, LOV 02/20/22 as of 05/27/22.   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 HIV, worse in winter, only in fingertips   Panic disorder    hx of anxiety, pt states that sometimes her chest hurts when she gets very anxious (05/28/22)   Vitamin D deficiency    Wears glasses     Social History   Tobacco Use   Smoking status: Every Day    Packs/day: 0.33    Years: 15.00    Additional pack years: 0.00    Total pack years: 4.95    Types: Cigarettes   Smokeless tobacco: Never   Tobacco comments:    Patient quit smoking for a couple of years and then started back.  Vaping Use   Vaping Use: Former   Substances: Nicotine, THC, CBD, Mixture of cannabinoids  Substance Use Topics   Alcohol use: Yes  Alcohol/week: 10.0 standard drinks of alcohol    Types: 10 Cans of beer per week   Drug use: Not Currently   Outpatient Medications Prior to Visit  Medication Sig Dispense Refill   albuterol (VENTOLIN HFA) 108 (90 Base) MCG/ACT inhaler Inhale 1-2 puffs into the lungs every 6 (six) hours as needed for wheezing or shortness of breath. (Patient taking differently: Inhale 2-3 puffs into the lungs as needed for wheezing or shortness of breath.) 18 g 3   bictegravir-emtricitabine-tenofovir AF (BIKTARVY) 50-200-25 MG TABS tablet Take one tablet daily 30 tablet 11   hydrOXYzine (ATARAX) 25 MG tablet Take 1 tablet (25 mg total) by mouth every 8 (eight) hours as needed for anxiety. 30 tablet 5   ibuprofen (ADVIL) 600 MG tablet Take 1 tablet (600 mg total) by mouth every 6 (six) hours as needed. 30 tablet 2   megestrol  (MEGACE ES) 625 MG/5ML suspension SHAKE LIQUID AND TAKE 5 ML(625 MG) BY MOUTH DAILY 450 mL 3   fluticasone (FLONASE) 50 MCG/ACT nasal spray Place 2 sprays into both nostrils daily. (Patient not taking: Reported on 12/24/2022) 9.9 mL 0   amoxicillin-clavulanate (AUGMENTIN) 875-125 MG tablet Take 1 tablet by mouth every 12 (twelve) hours. (Patient not taking: Reported on 12/24/2022) 14 tablet 0   benzonatate (TESSALON) 100 MG capsule Take 1 capsule (100 mg total) by mouth every 8 (eight) hours. (Patient not taking: Reported on 12/24/2022) 21 capsule 0   ipratropium (ATROVENT) 0.03 % nasal spray Place 2 sprays into both nostrils every 12 (twelve) hours. (Patient not taking: Reported on 12/24/2022) 30 mL 0   No facility-administered medications prior to visit.   Allergies  Allergen Reactions   Kiwi Extract Other (See Comments)    Tongue gets numb and tingling.       OBJECTIVE: Vitals:   12/24/22 1509  BP: 118/80  Pulse: 70  Temp: 98.2 F (36.8 C)  TempSrc: Temporal  SpO2: 100%  Weight: 135 lb (61.2 kg)  Height: 5\' 4"  (1.626 m)   Body mass index is 23.17 kg/m.   Physical Exam Constitutional:      Appearance: Normal appearance. She is not ill-appearing.  HENT:     Mouth/Throat:     Mouth: Mucous membranes are moist.     Pharynx: Oropharynx is clear.  Eyes:     General: No scleral icterus. Cardiovascular:     Rate and Rhythm: Normal rate.  Pulmonary:     Effort: Pulmonary effort is normal.  Neurological:     Mental Status: She is oriented to person, place, and time.  Psychiatric:        Mood and Affect: Mood normal.        Thought Content: Thought content normal.      HIV 1 RNA Quant (Copies/mL)  Date Value  08/15/2022 <20 (H)  02/20/2022 Not Detected  07/11/2021 Not Detected   CD4 T Cell Abs (/uL)  Date Value  08/15/2022 1,001  02/20/2022 890  07/11/2021 1,411    Lab Results  Component Value Date   HAV NON-REACTIVE 05/12/2017   HBEAG NON-REACTIVE  07/11/2021   HEPCAB NON-REACTIVE 05/12/2017   Lab Results  Component Value Date   WBC 8.2 07/10/2022   HGB 11.9 07/10/2022   HCT 36.9 07/10/2022   MCV 87 07/10/2022   PLT 249 07/10/2022   Lab Results  Component Value Date   CREATININE 0.75 06/05/2022   CREATININE 0.70 06/05/2022   CREATININE 0.83 02/20/2022   Lab Results  Component Value Date  ALT 18 08/15/2022   AST 23 08/15/2022   GGT 10 02/20/2022   ALKPHOS 27 (L) 01/11/2022   BILITOT 0.4 08/15/2022    Problem List Items Addressed This Visit       High   Human immunodeficiency virus (HIV) disease (HCC) - Primary    Very well-controlled on once daily Biktarvy. No trouble with access or side effect to medication. She is comfortable continuing this for treatment. Last CD4 reviewed 1001 and VL < 20 in January of this year.  We both feel comfortable deferring labs today. No immunizations needed.   We discussed referral to [facility] for assistance with food access.  Referral placed information given to leave a message for her.  Prefers phone calls after 3  Return in about 6 months (around 06/26/2023).        Relevant Orders   AMB REFERRAL TO [facility]ICE [facility]     Unprioritized   Anxiety    Greatly appreciate her therapist.  I encouraged her to continue working with her.      Hepatitis B infection    Hepatitis B markers all look great with her viral load less than 10.  She will continue treatment with Biktarvy once daily.  She has seroconverted her e-antibody and normalization of LFTs.  Most recent fibrosis risk assessment in July 2023 with fibrotest revealed F0.  Understand her similar details about the HIV/hepatitis B coinfection research study.  She may have some bandwidth to consider screening now that school is out.        Rexene Alberts, MSN, NP-C [facility]  [email or web address] Pager: [phone] Office: [phone] [facility]  Main Line: [phone]

## 2022-12-24 NOTE — Patient Instructions (Signed)
Nice to see you as always  Will have [facility] reach out to you on your phone. They will leave a message if unable to get to you.   No labs today.   Will see you back in 6 months and update blood work then  Will get you information about the hepatitis B study through your mychart.

## 2022-12-25 NOTE — Assessment & Plan Note (Signed)
Hepatitis B markers all look great with her viral load less than 10.  She will continue treatment with Biktarvy once daily.  She has seroconverted her e-antibody and normalization of LFTs.  Most recent fibrosis risk assessment in July 2023 with fibrotest revealed F0.  Understand her similar details about the HIV/hepatitis B coinfection research study.  She may have some bandwidth to consider screening now that school is out.

## 2022-12-25 NOTE — Assessment & Plan Note (Addendum)
Very well-controlled on once daily Biktarvy. No trouble with access or side effect to medication. She is comfortable continuing this for treatment. Last CD4 reviewed 1001 and VL < 20 in January of this year.  We both feel comfortable deferring labs today. No immunizations needed.   We discussed referral to [facility] for assistance with food access.  Referral placed information given to leave a message for her.  Prefers phone calls after 3  Return in about 6 months (around 06/26/2023).

## 2022-12-25 NOTE — Assessment & Plan Note (Signed)
Greatly appreciate her therapist.  I encouraged her to continue working with her.

## 2023-02-28 ENCOUNTER — Other Ambulatory Visit: Payer: Self-pay | Admitting: Infectious Diseases

## 2023-02-28 DIAGNOSIS — B20 Human immunodeficiency virus [HIV] disease: Secondary | ICD-10-CM

## 2023-03-17 ENCOUNTER — Other Ambulatory Visit: Payer: Self-pay | Admitting: Infectious Diseases

## 2023-03-17 DIAGNOSIS — B20 Human immunodeficiency virus [HIV] disease: Secondary | ICD-10-CM

## 2023-04-11 ENCOUNTER — Ambulatory Visit
Admission: EM | Admit: 2023-04-11 | Discharge: 2023-04-11 | Disposition: A | Discharge status: Home / Self Care | Payer: BC Managed Care – PPO | Attending: Physician Assistant | Admitting: Physician Assistant

## 2023-04-11 ENCOUNTER — Encounter: Payer: Self-pay | Admitting: *Deleted

## 2023-04-11 ENCOUNTER — Ambulatory Visit: Payer: BC Managed Care – PPO

## 2023-04-11 ENCOUNTER — Other Ambulatory Visit: Payer: Self-pay

## 2023-04-11 DIAGNOSIS — M79672 Pain in left foot: Secondary | ICD-10-CM | POA: Diagnosis not present

## 2023-04-11 DIAGNOSIS — S99922A Unspecified injury of left foot, initial encounter: Secondary | ICD-10-CM | POA: Diagnosis not present

## 2023-04-11 NOTE — ED Triage Notes (Signed)
Pt reports she stubbed her left 4th toe last night on ottoman. Bruising noted. States pain radiates into her foot and up her leg

## 2023-04-14 ENCOUNTER — Other Ambulatory Visit: Payer: Self-pay | Admitting: Obstetrics and Gynecology

## 2023-04-16 ENCOUNTER — Encounter: Payer: Self-pay | Admitting: Physician Assistant

## 2023-04-16 NOTE — ED Provider Notes (Signed)
[name]    CSN: [id number] Arrival date & time: 04/11/23  1834      History   Chief Complaint Chief Complaint  Patient presents with   Toe Injury    HPI Angelica Hopkins is a 42 y.o. female.   Patient here today for evaluation of left fourth toe injury.  She states that last night she was walking accidentally stubbed her fourth left toe on an ottoman.  She states that she had immediate pain and has had bruising and swelling since then.  She is concerned for possible fracture.  She does not report treatment for symptoms.  The history is provided by the patient.    Past Medical History:  Diagnosis Date   Abnormal Pap smear of cervix    LSISL, colposcopy 2012   Asthma    inhaler prescribed by PCP, pt states occasional SOB due to asthma   Cellulitis and abscess of buttock 10/22/2019   treated w/ incision and drainage   Depression    no current meds as of 05/28/22   Fibroids    uterine fibroids   Hepatitis B infection    Follows with [facility] for infectious disease, LOV 02/20/22 as of 05/27/22.   HIV (human immunodeficiency virus infection) (HCC) 2005   Follows w/ [facility] for Infectious disease, LOV 02/20/22 as of 05/27/22.   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/15/2019   Migraines 07/18/2017   Hepatitis B infection 11/15/2015   Screening examination for sexually transmitted  disease 09/14/2014   Anxiety 01/25/2010   Human immunodeficiency virus (HIV) disease (HCC) 11/11/2008    Past Surgical History:  Procedure Laterality Date   APPENDECTOMY     around 2000   BREAST EXCISIONAL BIOPSY     CESAREAN SECTION  2003   one previous   RADIOACTIVE SEED GUIDED EXCISIONAL BREAST BIOPSY Left 03/04/2019   Procedure: RADIOACTIVE SEED GUIDED EXCISIONAL LEFT BREAST BIOPSY AND EXCISION OF LEFT BREAST MASS;  Surgeon: Almond Lint, MD;  Location: [facility];  Service: General;  Laterality: Left;   VAGINAL HYSTERECTOMY N/A 06/05/2022   Procedure: HYSTERECTOMY VAGINAL;  Surgeon: Warden Fillers, MD;  Location: [facility];  Service: Gynecology;  Laterality: N/A;    OB History     Gravida  2   Para  1   Term  1   Preterm  0   AB  1   Living  1      SAB  1   IAB  0   Ectopic  0   Multiple  0   Live Births  1            Home Medications    Prior to Admission medications   Medication Sig Start Date End Date Taking? Authorizing Provider  albuterol (VENTOLIN HFA) 108 (90 Base)  MCG/ACT inhaler Inhale 1-2 puffs into the lungs every 6 (six) hours as needed for wheezing or shortness of breath. Patient taking differently: Inhale 2-3 puffs into the lungs as needed for wheezing or shortness of breath. 06/13/21  Yes Wallis Bamberg, PA-C  bictegravir-emtricitabine-tenofovir AF (BIKTARVY) 50-200-25 MG TABS tablet TAKE 1 TABLET BY MOUTH 1 TIME A DAY. 03/18/23  Yes Blanchard Kelch, NP  hydrOXYzine (ATARAX) 25 MG tablet Take 1 tablet (25 mg total) by mouth every 8 (eight) hours as needed for anxiety. 08/15/22  Yes Blanchard Kelch, NP  fluticasone (FLONASE) 50 MCG/ACT nasal spray Place 2 sprays into both nostrils daily. Patient not taking: Reported on 12/24/2022 09/04/22   Roxy Horseman, PA-C  ibuprofen (ADVIL) 600 MG tablet Take 1 tablet (600 mg total) by mouth every 6 (six) hours as needed. 06/06/22   Warden Fillers, MD  megestrol  (MEGACE ES) 625 MG/5ML suspension SHAKE LIQUID AND TAKE 5 ML(625 MG) BY MOUTH DAILY 09/13/22   Blanchard Kelch, NP    Family History Family History  Problem Relation Age of Onset   Uterine cancer Maternal Aunt    Lung cancer Maternal Grandmother    Liver disease Mother    HIV Mother    Drug abuse Mother    HIV Father    Diabetes Father    Drug abuse Father    Healthy Brother    Breast cancer Paternal Aunt    Stomach cancer Paternal Grandmother    Healthy Sister    Healthy Brother    Healthy Sister    Healthy Daughter    Colon cancer Neg Hx     Social History Social History   Tobacco Use   Smoking status: Every Day    Current packs/day: 0.33    Average packs/day: 0.3 packs/day for 15.0 years (5.0 ttl pk-yrs)    Types: Cigarettes   Smokeless tobacco: Never   Tobacco comments:    Patient quit smoking for a couple of years and then started back.  Vaping Use   Vaping status: Every Day   Substances: Nicotine, THC, CBD, Mixture of cannabinoids  Substance Use Topics   Alcohol use: Yes    Alcohol/week: 10.0 standard drinks of alcohol    Types: 10 Cans of beer per week    Comment: 2 beers/day   Drug use: Not Currently     Allergies   Kiwi extract   Review of Systems Review of Systems  Constitutional:  Negative for chills and fever.  Eyes:  Negative for discharge and redness.  Gastrointestinal:  Negative for abdominal pain, nausea and vomiting.  Musculoskeletal:  Positive for arthralgias and joint swelling.  Skin:  Positive for color change. Negative for wound.     Physical Exam Triage Vital Signs ED Triage Vitals  Encounter Vitals Group     BP 04/11/23 1852 125/83     Systolic BP Percentile --      Diastolic BP Percentile --      Pulse Rate 04/11/23 1852 78     Resp 04/11/23 1852 16     Temp 04/11/23 1852 99.6 F (37.6 C)     Temp Source 04/11/23 1852 Oral     SpO2 04/11/23 1852 98 %     Weight --      Height --      Head Circumference --      Peak  Flow --      Pain Score 04/11/23 1848 5     Pain Loc --  Pain Education --      Exclude from Growth Chart --    No data found.  Updated Vital Signs BP 125/83 (BP Location: Left Arm)   Pulse 78   Temp 99.6 F (37.6 C) (Oral)   Resp 16   LMP 05/30/2022 Comment: starting spotting  SpO2 98%     Physical Exam Vitals and nursing note reviewed.  Constitutional:      General: She is not in acute distress.    Appearance: Normal appearance. She is not ill-appearing.  HENT:     Head: Normocephalic and atraumatic.  Eyes:     Conjunctiva/sclera: Conjunctivae normal.  Cardiovascular:     Rate and Rhythm: Normal rate.  Pulmonary:     Effort: Pulmonary effort is normal. No respiratory distress.  Musculoskeletal:     Comments: Left fourth toe with diffuse distal swelling and bruising noted.  Neurological:     Mental Status: She is alert.  Psychiatric:        Mood and Affect: Mood normal.        Behavior: Behavior normal.        Thought Content: Thought content normal.      UC Treatments / Results  Labs (all labs ordered are listed, but only abnormal results are displayed) Labs Reviewed - No data to display  EKG   Radiology No results found.  Procedures Procedures (including critical care time)  Medications Ordered in UC Medications - No data to display  Initial Impression / Assessment and Plan / UC Course  I have reviewed the triage vital signs and the nursing notes.  Pertinent labs & imaging results that were available during my care of the patient were reviewed by me and considered in my medical decision making (see chart for details).    X-ray without fracture.  Recommended she continue to monitor and follow-up with any further concerns.  Okay to take ibuprofen or Tylenol as needed for pain.  Final Clinical Impressions(s) / UC Diagnoses   Final diagnoses:  Toe injury, left, initial encounter   Discharge Instructions   None    ED Prescriptions    None    PDMP not reviewed this encounter.   Tomi Bamberger, PA-C 04/16/23 2137

## 2023-04-21 ENCOUNTER — Other Ambulatory Visit: Payer: Self-pay | Admitting: Infectious Diseases

## 2023-04-21 ENCOUNTER — Encounter: Payer: Self-pay | Admitting: Infectious Diseases

## 2023-04-21 ENCOUNTER — Ambulatory Visit (INDEPENDENT_AMBULATORY_CARE_PROVIDER_SITE_OTHER): Payer: BC Managed Care – PPO | Admitting: Infectious Diseases

## 2023-04-21 ENCOUNTER — Other Ambulatory Visit: Payer: Self-pay

## 2023-04-21 VITALS — BP 114/75 | HR 84 | Temp 98.0°F | Ht 64.0 in | Wt 130.0 lb

## 2023-04-21 DIAGNOSIS — R0683 Snoring: Secondary | ICD-10-CM | POA: Diagnosis not present

## 2023-04-21 DIAGNOSIS — B181 Chronic viral hepatitis B without delta-agent: Secondary | ICD-10-CM

## 2023-04-21 DIAGNOSIS — B977 Papillomavirus as the cause of diseases classified elsewhere: Secondary | ICD-10-CM

## 2023-04-21 DIAGNOSIS — B20 Human immunodeficiency virus [HIV] disease: Secondary | ICD-10-CM

## 2023-04-21 DIAGNOSIS — R630 Anorexia: Secondary | ICD-10-CM

## 2023-04-21 DIAGNOSIS — F419 Anxiety disorder, unspecified: Secondary | ICD-10-CM

## 2023-04-21 DIAGNOSIS — G478 Other sleep disorders: Secondary | ICD-10-CM | POA: Diagnosis not present

## 2023-04-21 DIAGNOSIS — B1910 Unspecified viral hepatitis B without hepatic coma: Secondary | ICD-10-CM

## 2023-04-21 MED ORDER — BIKTARVY 50-200-25 MG PO TABS
ORAL_TABLET | ORAL | 11 refills | Status: DC
Start: 2023-04-21 — End: 2023-09-12

## 2023-04-21 MED ORDER — HYDROXYZINE HCL 25 MG PO TABS: 25.0000 mg | ORAL_TABLET | Freq: Three times a day (TID) | ORAL | 5 refills | Status: DC | PRN

## 2023-04-21 NOTE — Assessment & Plan Note (Signed)
S/P LAVH - would recommend one time vaginal pap before deferring screenings. H/O some abnormal paps requiring colposcopy but no further interventions.

## 2023-04-21 NOTE — Patient Instructions (Addendum)
Schedule on the way out for a Thursday covid booster.   [facility] at [facility] [address] [place] [address] [place],  [place]  [address] Get Driving Directions Main: [phone]  Will see you back in 6 months.

## 2023-04-21 NOTE — Addendum Note (Signed)
Addended by: Ulyses Southward Q on: 04/21/2023 09:00 PM   Modules accepted: Orders

## 2023-04-21 NOTE — Assessment & Plan Note (Signed)
Refill hydroxyzine for PRN use

## 2023-04-21 NOTE — Progress Notes (Signed)
Name: Angelica Hopkins  DOB: Dec 02, 1980  MRN: [id number] PCP: Patient, No Pcp Per      Subjective   Brief Narrative:  Angelica Hopkins is a 42 y.o. female with well controlled HIV, Dx 200, stage 2. Deferred treatment with ART x 5 years.  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   Previous Regimens:  Genvoya 2010 >> suppressed, inconsistent use  Biktarvy 2018 >> suppressed    Genotype:  04/2017 - sensitive     Chief Complaint  Patient presents with   Follow-up  HIV/HBV    HPI:  Angelica Hopkins is here today for follow up.  Doing well on biktarvy - no concerns with access or side effects to her medication.  Went to UC for toe injury - hit it on a couch at home - swelling is better and improved.  Megace continues to be helpful - Angelica Hopkins titrates the dose to effect.   Would like to see sleep specialist -  Puts herself to bed around 10-11 pm. Gets up around 5:45 to start her day.  Sleep talking and conversations have gotten worse and others report Angelica Hopkins has the appearance of being awake. No recollection of these moments. No evidence to her knowledge of sleep walking as an adult (did as a kid). Falls asleep easily. Snoring has been worse also. Never fully rested no matter how much sleep Angelica Hopkins gets.     Review of Systems  Constitutional:  Negative for chills and fever.  HENT:  Negative for tinnitus.   Eyes:  Negative for blurred vision and photophobia.  Respiratory:  Negative for cough and sputum production.   Cardiovascular:  Negative for chest pain.  Gastrointestinal:  Negative for diarrhea, nausea and vomiting.  Genitourinary:  Negative for dysuria.  Skin:  Negative for rash.  Neurological:  Negative for headaches.     Past Medical History:  Diagnosis Date   Abnormal Pap smear of cervix    LSISL, colposcopy 2012   Asthma    inhaler prescribed by PCP, pt states occasional SOB due to asthma   Cellulitis and abscess of buttock 10/22/2019   treated w/  incision and drainage   Depression    no current meds as of 05/28/22   Fibroids    uterine fibroids   Hepatitis B infection    Follows with [facility] for infectious disease, LOV 02/20/22 as of 05/27/22.   HIV (human immunodeficiency virus infection) (HCC) 2005   Follows w/ [facility] for Infectious disease, LOV 02/20/22 as of 05/27/22.   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 HIV, worse in winter, only in fingertips   Panic disorder    hx of anxiety, pt states that sometimes her chest hurts when Angelica Hopkins gets very anxious (05/28/22)   Vitamin D deficiency    Wears glasses     Social History   Tobacco Use   Smoking status: Every Day    Current packs/day: 0.33    Average packs/day: 0.3 packs/day for 15.0 years (5.0 ttl pk-yrs)    Types: Cigarettes   Smokeless tobacco: Never   Tobacco comments:    Patient quit smoking for a couple of years and then started back.  Vaping Use   Vaping status: Every Day   Substances: Nicotine, THC, CBD, Mixture of cannabinoids  Substance Use Topics   Alcohol use: Yes    Alcohol/week: 10.0 standard drinks of alcohol  Types: 10 Cans of beer per week    Comment: 2 beers/day   Drug use: Not Currently   Outpatient Medications Prior to Visit  Medication Sig Dispense Refill   albuterol (VENTOLIN HFA) 108 (90 Base) MCG/ACT inhaler Inhale 1-2 puffs into the lungs every 6 (six) hours as needed for wheezing or shortness of breath. (Patient taking differently: Inhale 2-3 puffs into the lungs as needed for wheezing or shortness of breath.) 18 g 3   ibuprofen (ADVIL) 600 MG tablet TAKE 1 TABLET(600 MG) BY MOUTH EVERY 6 HOURS AS NEEDED 30 tablet 2   megestrol (MEGACE ES) 625 MG/5ML suspension SHAKE LIQUID AND TAKE 5 ML(625 MG) BY MOUTH DAILY 450 mL 3   bictegravir-emtricitabine-tenofovir AF (BIKTARVY) 50-200-25 MG TABS tablet TAKE 1 TABLET BY MOUTH 1 TIME A DAY. 30 tablet 5   hydrOXYzine (ATARAX) 25 MG tablet  Take 1 tablet (25 mg total) by mouth every 8 (eight) hours as needed for anxiety. 30 tablet 5   fluticasone (FLONASE) 50 MCG/ACT nasal spray Place 2 sprays into both nostrils daily. (Patient not taking: Reported on 12/24/2022) 9.9 mL 0   No facility-administered medications prior to visit.   Allergies  Allergen Reactions   Kiwi Extract Other (See Comments)    Tongue gets numb and tingling.       OBJECTIVE: Vitals:   04/21/23 1505  BP: 114/75  Pulse: 84  Temp: 98 F (36.7 C)  TempSrc: Temporal  SpO2: 100%  Weight: 130 lb (59 kg)  Height: 5\' 4"  (1.626 m)   Body mass index is 22.31 kg/m.   Physical Exam Cardiovascular:     Rate and Rhythm: Normal rate and regular rhythm.  Pulmonary:     Effort: Pulmonary effort is normal.     Comments: No shortness of breath detected in conversation.  Neurological:     Mental Status: Angelica Hopkins is oriented to person, place, and time.  Psychiatric:        Mood and Affect: Mood normal.        Behavior: Behavior normal.        Thought Content: Thought content normal.        Judgment: Judgment normal.      HIV 1 RNA Quant (Copies/mL)  Date Value  08/15/2022 <20 (H)  02/20/2022 Not Detected  07/11/2021 Not Detected   CD4 T Cell Abs (/uL)  Date Value  08/15/2022 1,001  02/20/2022 890  07/11/2021 1,411    Lab Results  Component Value Date   HAV NON-REACTIVE 05/12/2017   HBEAG NON-REACTIVE 07/11/2021   HEPCAB NON-REACTIVE 05/12/2017   Lab Results  Component Value Date   WBC 8.2 07/10/2022   HGB 11.9 07/10/2022   HCT 36.9 07/10/2022   MCV 87 07/10/2022   PLT 249 07/10/2022   Lab Results  Component Value Date   CREATININE 0.75 06/05/2022   CREATININE 0.70 06/05/2022   CREATININE 0.83 02/20/2022   Lab Results  Component Value Date   ALT 18 08/15/2022   AST 23 08/15/2022   GGT 10 02/20/2022   ALKPHOS 27 (L) 01/11/2022   BILITOT 0.4 08/15/2022    Problem List Items Addressed This Visit       High   Human  immunodeficiency virus (HIV) disease (HCC) - Primary    Very well controlled on once daily Biktarvy. No concerns with access or adherence to medication. They are tolerating the medication well without side effects. No drug interactions identified. Pertinent lab tests ordered today.  No changes to insurance  coverage.  No dental needs today.  Stress at work - has counselor if needed, has considered reaching back out  Sexual health and family planning discussed - no needs today.  Cardiovascular risk will need assessment next OV with fasting lipid - discussed primary prevention with statin for those of moderate risk.   Return in about 6 months (around 10/19/2023).        Relevant Medications   bictegravir-emtricitabine-tenofovir AF (BIKTARVY) 50-200-25 MG TABS tablet   Other Relevant Orders   HIV 1 RNA quant-no reflex-bld   T-helper cells (CD4) count   CBC     Unprioritized   Anxiety    Refill hydroxyzine for PRN use       Relevant Medications   hydrOXYzine (ATARAX) 25 MG tablet   Hepatitis B infection    Will check viral load - seroconverted a few years back eAg.  Will repeat ultrasound after 42 next FU visit.       Relevant Medications   bictegravir-emtricitabine-tenofovir AF (BIKTARVY) 50-200-25 MG TABS tablet   Other Relevant Orders   Hepatitis B DNA, ultraquantitative, PCR   HPV in female    S/P LAVH - would recommend one time vaginal pap before deferring screenings. H/O some abnormal paps requiring colposcopy but no further interventions.       Relevant Medications   bictegravir-emtricitabine-tenofovir AF (BIKTARVY) 50-200-25 MG TABS tablet   Poor appetite    Continue megace - can refill when ready       Sleep talking    Referral to sleep medicine for worsening sleep talking, poor sleep quality and snoring.       Relevant Orders   Ambulatory referral to Sleep Studies   Snoring   Relevant Orders   Ambulatory referral to Sleep Studies   Covid booster today,  Declined flu shot  Rexene Alberts, MSN, NP-C [facility] Infectious Disease [facility]  [email or web address] Pager: [phone] Office: [phone] [facility] Main Line: [phone]

## 2023-04-21 NOTE — Assessment & Plan Note (Signed)
Very well controlled on once daily Biktarvy. No concerns with access or adherence to medication. They are tolerating the medication well without side effects. No drug interactions identified. Pertinent lab tests ordered today.  No changes to insurance coverage.  No dental needs today.  Stress at work - has counselor if needed, has considered reaching back out  Sexual health and family planning discussed - no needs today.  Cardiovascular risk will need assessment next OV with fasting lipid - discussed primary prevention with statin for those of moderate risk.   Return in about 6 months (around 10/19/2023).

## 2023-04-21 NOTE — Assessment & Plan Note (Signed)
Will check viral load - seroconverted a few years back eAg.  Will repeat ultrasound after 42 next FU visit.

## 2023-04-21 NOTE — Assessment & Plan Note (Signed)
Continue megace - can refill when ready

## 2023-04-21 NOTE — Assessment & Plan Note (Signed)
Referral to sleep medicine for worsening sleep talking, poor sleep quality and snoring.

## 2023-04-22 LAB — T-HELPER CELLS (CD4) COUNT (NOT AT ARMC)
CD4 % Helper T Cell: 41 % (ref 33–65)
CD4 T Cell Abs: 1060 /uL (ref 400–1790)

## 2023-04-23 LAB — CBC
HCT: 41.3 % (ref 35.0–45.0)
Hemoglobin: 13.4 g/dL (ref 11.7–15.5)
MCH: 29.5 pg (ref 27.0–33.0)
MCHC: 32.4 g/dL (ref 32.0–36.0)
MCV: 90.8 fL (ref 80.0–100.0)
MPV: 10.3 fL (ref 7.5–12.5)
Platelets: 244 10*3/uL (ref 140–400)
RBC: 4.55 10*6/uL (ref 3.80–5.10)
RDW: 12.3 % (ref 11.0–15.0)
WBC: 6.8 10*3/uL (ref 3.8–10.8)

## 2023-04-23 LAB — HEPATITIS B DNA, ULTRAQUANTITATIVE, PCR
Hepatitis B DNA: NOT DETECTED IU/mL
Hepatitis B virus DNA: NOT DETECTED Log IU/mL

## 2023-04-23 LAB — HIV-1 RNA QUANT-NO REFLEX-BLD
HIV 1 RNA Quant: NOT DETECTED Copies/mL
HIV-1 RNA Quant, Log: NOT DETECTED Log cps/mL

## 2023-09-05 ENCOUNTER — Ambulatory Visit: Payer: Self-pay

## 2023-09-12 ENCOUNTER — Other Ambulatory Visit: Payer: Self-pay | Admitting: Infectious Diseases

## 2023-09-12 DIAGNOSIS — B20 Human immunodeficiency virus [HIV] disease: Secondary | ICD-10-CM

## 2023-10-01 ENCOUNTER — Encounter: Payer: Self-pay | Admitting: Obstetrics and Gynecology

## 2023-10-02 ENCOUNTER — Other Ambulatory Visit: Payer: Self-pay

## 2023-10-02 ENCOUNTER — Ambulatory Visit (INDEPENDENT_AMBULATORY_CARE_PROVIDER_SITE_OTHER): Admitting: Obstetrics and Gynecology

## 2023-10-02 ENCOUNTER — Encounter: Payer: Self-pay | Admitting: Obstetrics and Gynecology

## 2023-10-02 VITALS — BP 121/86 | HR 85 | Ht 64.0 in | Wt 136.3 lb

## 2023-10-02 DIAGNOSIS — R1030 Lower abdominal pain, unspecified: Secondary | ICD-10-CM

## 2023-10-02 NOTE — Progress Notes (Addendum)
   CC: abdominal pain Subjective:    Patient ID: Angelica Hopkins, female    DOB: 13-Aug-1980, 43 y.o.   MRN: [id number]  HPI 43 yo female with three day history of abdominal pain.  Pt is s/p TVH about 1 1/2 years ago.  No issues until recently.  Pt had pain from Monday to Wednesday with mild nausea.  The abdominal pain was responsive to ibuprofen.     Review of Systems     Objective:   Physical Exam Vitals:   10/02/23 1437  BP: 121/86  Pulse: 85  Abdomen: mild left sided tenderness No suprapubic pain, no real adnexal pain Abdomen does appear somewhat bloated and is tympanic       Assessment & Plan:   1. Lower abdominal pain (Primary) Since pain has resolved and abdomen is somewhat bloated, patient may have discomfort from retained gas. Advised simethicone and prune/apple juice to help resolve gas. TVUS held for now, but it pain returns, pt advised to call back so we may schedule imaging of ovaries.    Warden Fillers, MD Faculty Attending, Center for [facility]

## 2023-10-06 ENCOUNTER — Ambulatory Visit
Admission: RE | Admit: 2023-10-06 | Discharge: 2023-10-06 | Disposition: A | Discharge status: Home / Self Care | Source: Ambulatory Visit

## 2023-10-06 ENCOUNTER — Other Ambulatory Visit: Payer: Self-pay

## 2023-10-06 ENCOUNTER — Encounter: Payer: Self-pay | Admitting: *Deleted

## 2023-10-06 DIAGNOSIS — J029 Acute pharyngitis, unspecified: Secondary | ICD-10-CM | POA: Diagnosis not present

## 2023-10-06 DIAGNOSIS — J111 Influenza due to unidentified influenza virus with other respiratory manifestations: Secondary | ICD-10-CM

## 2023-10-06 LAB — POC COVID19/FLU A&B COMBO
Covid Antigen, POC: NEGATIVE
Influenza A Antigen, POC: NEGATIVE
Influenza B Antigen, POC: NEGATIVE

## 2023-10-06 NOTE — Discharge Instructions (Addendum)
 Covid and flu are both negative.  Most likely you have a viral illness: no antibiotic is indicated at this time, May treat with OTC meds of choice. Make sure to drink plenty of fluids to stay hydrated(gatorade, water, popsicles,jello,etc), avoid caffeine products. Follow up with PCP in 3 days, infectious disesase provider to recheck labs if no improvement, sooner if worse. If you develop chest pain,shortness of breath, or palpitations go to Er for further evaluation.

## 2023-10-06 NOTE — ED Provider Notes (Signed)
 [name]    CSN: [id number] Arrival date & time: 10/06/23  1440      History   Chief Complaint Chief Complaint  Patient presents with   Cough   Sore Throat    HPI Angelica Hopkins is a 43 y.o. female.   43 year old female, Angelica Hopkins, presents to urgent care for evaluation of cough,congestion,ear pain, sore throat. Pt is a Runner, broadcasting/film/video.   PMH: Asthma, HIV, Hep B, depression,anxiety  The history is provided by the patient. No language interpreter was used.    Past Medical History:  Diagnosis Date   Abnormal Pap smear of cervix    LSISL, colposcopy 2012   Asthma    inhaler prescribed by PCP, pt states occasional SOB due to asthma   Cellulitis and abscess of buttock 10/22/2019   treated w/ incision and drainage   Depression    no current meds as of 05/28/22   Fibroids    uterine fibroids   Hepatitis B infection    Follows with [facility] for infectious disease, LOV 02/20/22 as of 05/27/22.   HIV (human immunodeficiency virus infection) (HCC) 2005   Follows w/ [facility] for Infectious disease, LOV 02/20/22 as of 05/27/22.   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   Viral pharyngitis 04/17/2018   Migraines 07/18/2017   Hepatitis B infection 11/15/2015   Screening examination for sexually transmitted disease 09/14/2014   Anxiety 01/25/2010   Human immunodeficiency virus (HIV) disease  (HCC) 11/11/2008    Past Surgical History:  Procedure Laterality Date   APPENDECTOMY     around 2000   BREAST EXCISIONAL BIOPSY     CESAREAN SECTION  2003   one previous   RADIOACTIVE SEED GUIDED EXCISIONAL BREAST BIOPSY Left 03/04/2019   Procedure: RADIOACTIVE SEED GUIDED EXCISIONAL LEFT BREAST BIOPSY AND EXCISION OF LEFT BREAST MASS;  Surgeon: Almond Lint, MD;  Location: [facility];  Service: General;  Laterality: Left;   VAGINAL HYSTERECTOMY N/A 06/05/2022   Procedure: HYSTERECTOMY VAGINAL;  Surgeon: Warden Fillers, MD;  Location: [facility];  Service: Gynecology;  Laterality: N/A;    OB History     Gravida  2   Para  1   Term  1   Preterm  0   AB  1   Living  1      SAB  1   IAB  0   Ectopic  0   Multiple  0   Live Births  1            Home Medications    Prior to Admission medications   Medication Sig Start Date End Date Taking? Authorizing Provider  albuterol (VENTOLIN HFA) 108 (90 Base) MCG/ACT inhaler Inhale 1-2 puffs into the lungs every 6 (six) hours as needed for wheezing or shortness of breath. Patient  taking differently: Inhale 2-3 puffs into the lungs as needed for wheezing or shortness of breath. 06/13/21  Yes Mani, Mario, PA-C  BIKTARVY 50-200-25 MG TABS tablet TAKE 1 TABLET BY MOUTH 1 TIME A DAY. 09/12/23  Yes Blanchard Kelch, NP  hydrOXYzine (ATARAX) 25 MG tablet Take 1 tablet (25 mg total) by mouth every 8 (eight) hours as needed for anxiety. 04/21/23  Yes Blanchard Kelch, NP  ibuprofen (ADVIL) 600 MG tablet TAKE 1 TABLET(600 MG) BY MOUTH EVERY 6 HOURS AS NEEDED 04/17/23  Yes Warden Fillers, MD  megestrol (MEGACE ES) 625 MG/5ML suspension SHAKE LIQUID AND TAKE 5 ML(625 MG) BY MOUTH DAILY 09/13/22  Yes Blanchard Kelch, NP  ferrous sulfate (FEROSUL) 325 (65 FE) MG tablet Take 1 tablet by mouth every other day. Patient not taking: Reported on 10/06/2023 06/06/22   [provider]  fluticasone  (FLONASE) 50 MCG/ACT nasal spray Place 2 sprays into both nostrils daily. Patient not taking: Reported on 10/02/2023 09/04/22   Roxy Horseman, PA-C    Family History Family History  Problem Relation Age of Onset   Uterine cancer Maternal Aunt    Lung cancer Maternal Grandmother    Liver disease Mother    HIV Mother    Drug abuse Mother    HIV Father    Diabetes Father    Drug abuse Father    Healthy Brother    Breast cancer Paternal Aunt    Stomach cancer Paternal Grandmother    Healthy Sister    Healthy Brother    Healthy Sister    Healthy Daughter    Colon cancer Neg Hx     Social History Social History   Tobacco Use   Smoking status: Every Day    Current packs/day: 0.33    Average packs/day: 0.3 packs/day for 15.0 years (5.0 ttl pk-yrs)    Types: Cigarettes   Smokeless tobacco: Never   Tobacco comments:    Patient quit smoking for a couple of years and then started back.  Vaping Use   Vaping status: Former   Substances: Nicotine, THC, CBD, Mixture of cannabinoids  Substance Use Topics   Alcohol use: Yes    Alcohol/week: 10.0 standard drinks of alcohol    Types: 10 Cans of beer per week    Comment: 2 beers/day   Drug use: Not Currently     Allergies   Hydrocodone-acetaminophen and Kiwi extract   Review of Systems Review of Systems  Constitutional:  Negative for fever.  HENT:  Positive for sore throat.   Respiratory:  Positive for cough.   All other systems reviewed and are negative.    Physical Exam Triage Vital Signs ED Triage Vitals [10/06/23 1529]  Encounter Vitals Group     BP      Systolic BP Percentile      Diastolic BP Percentile      Pulse      Resp      Temp      Temp src      SpO2      Weight      Height      Head Circumference      Peak Flow      Pain Score 7     Pain Loc      Pain Education      Exclude from Growth Chart    No data found.  Updated Vital Signs BP 135/81 (BP Location: Left Arm)   Pulse 69  Temp 99.4 F  (37.4 C) (Oral)   Resp 16   LMP 05/30/2022 Comment: starting spotting  SpO2 98%   Visual Acuity Right Eye Distance:   Left Eye Distance:   Bilateral Distance:    Right Eye Near:   Left Eye Near:    Bilateral Near:     Physical Exam Vitals and nursing note reviewed.  Constitutional:      General: She is not in acute distress.    Appearance: She is well-developed.  HENT:     Head: Normocephalic.     Right Ear: Tympanic membrane is retracted.     Left Ear: Tympanic membrane is retracted.     Nose: Congestion present.     Mouth/Throat:     Lips: Pink.     Mouth: Mucous membranes are moist.     Pharynx: Oropharynx is clear. Uvula midline.  Eyes:     General: Lids are normal.     Conjunctiva/sclera: Conjunctivae normal.     Pupils: Pupils are equal, round, and reactive to light.  Neck:     Trachea: No tracheal deviation.  Cardiovascular:     Rate and Rhythm: Normal rate and regular rhythm.     Heart sounds: Normal heart sounds. No murmur heard. Pulmonary:     Effort: Pulmonary effort is normal.     Breath sounds: Normal breath sounds and air entry.  Abdominal:     General: Bowel sounds are normal.     Palpations: Abdomen is soft.     Tenderness: There is no abdominal tenderness.  Musculoskeletal:        General: Normal range of motion.     Cervical back: Normal range of motion.  Lymphadenopathy:     Cervical: No cervical adenopathy.  Skin:    General: Skin is warm and dry.     Findings: No rash.  Neurological:     General: No focal deficit present.     Mental Status: She is alert and oriented to person, place, and time.     GCS: GCS eye subscore is 4. GCS verbal subscore is 5. GCS motor subscore is 6.  Psychiatric:        Attention and Perception: Attention normal.        Mood and Affect: Mood normal.        Speech: Speech normal.        Behavior: Behavior normal. Behavior is cooperative.      UC Treatments / Results  Labs (all labs ordered are listed,  but only abnormal results are displayed) Labs Reviewed  POC COVID19/FLU A&B COMBO - Normal    EKG   Radiology No results found.  Procedures Procedures (including critical care time)  Medications Ordered in UC Medications - No data to display  Initial Impression / Assessment and Plan / UC Course  I have reviewed the triage vital signs and the nursing notes.  Pertinent labs & imaging results that were available during my care of the patient were reviewed by me and considered in my medical decision making (see chart for details).    Discussed exam findings and plan of care with patient, negative covid and flu, strict go to ER precautions given. Patient verbalized understanding to this provider.  Ddx: Influenza like illness,viral illness, allergies Final Clinical Impressions(s) / UC Diagnoses   Final diagnoses:  Influenza-like illness  Viral pharyngitis     Discharge Instructions      Covid and flu are both negative.  Most likely you have  a viral illness: no antibiotic is indicated at this time, May treat with OTC meds of choice. Make sure to drink plenty of fluids to stay hydrated(gatorade, water, popsicles,jello,etc), avoid caffeine products. Follow up with PCP in 3 days, infectious disesase provider to recheck labs if no improvement, sooner if worse. If you develop chest pain,shortness of breath, or palpitations go to Er for further evaluation.     ED Prescriptions   None    PDMP not reviewed this encounter.   Clancy Gourd, NP 10/06/23 1644

## 2023-10-06 NOTE — ED Triage Notes (Signed)
 Cough, scratchy throat, ear pain x 2-3 days. Pt is a Runner, broadcasting/film/video

## 2023-10-09 ENCOUNTER — Encounter (HOSPITAL_COMMUNITY): Payer: Self-pay

## 2023-10-09 ENCOUNTER — Ambulatory Visit (HOSPITAL_COMMUNITY)
Admission: RE | Admit: 2023-10-09 | Discharge: 2023-10-09 | Disposition: A | Discharge status: Home / Self Care | Source: Ambulatory Visit | Attending: Family Medicine | Admitting: Family Medicine

## 2023-10-09 ENCOUNTER — Other Ambulatory Visit: Payer: Self-pay

## 2023-10-09 VITALS — BP 131/81 | HR 73 | Temp 99.1°F | Resp 22

## 2023-10-09 DIAGNOSIS — J0100 Acute maxillary sinusitis, unspecified: Secondary | ICD-10-CM | POA: Diagnosis not present

## 2023-10-09 MED ORDER — AMOXICILLIN-POT CLAVULANATE 875-125 MG PO TABS
1.0000 | ORAL_TABLET | Freq: Two times a day (BID) | ORAL | 0 refills | Status: AC
Start: 2023-10-09 — End: 2023-10-19

## 2023-10-09 NOTE — Discharge Instructions (Signed)
 You were diagnosed with acute sinus infection.  I have sent out augmentin to treat this.  You may use tylenol/motrin for pain as well, in addition to over the counter medications.  Please return if not improving.

## 2023-10-09 NOTE — ED Triage Notes (Signed)
 Patient reports she is not feeling better using over the counter medicines.  Reports a bad cough and stuffy nose.  Patient was seen on 10/06/2023.    Chest sore with coughing.   Has used a nasal spray, ibuprofen, benadryl, claritin.

## 2023-10-09 NOTE — ED Provider Notes (Signed)
 [facility]    CSN: [id number] Arrival date & time: 10/09/23  1018      History   Chief Complaint Chief Complaint  Patient presents with   Follow-up    Still not feeling well with over the counter medicine. Sinuses severely stopped up and bad cough - Entered by patient   Appointment    10:30    HPI Angelica Hopkins is a 43 y.o. female.   Patient is here for URI symptoms.  Symptoms started 4 days ago with congestion, dizziness, headache.  She was seen here on 3/10, and was told was viral.  She continues to not feel well.  Sinus congestion is what is bothering her the most.  Some sinus pain, pressure to the bridge of the nose.  Some chills.  No fevers.  Also with cough. No wheezing, but has had to use her inhaler more.  Using otc medications without much help.         Past Medical History:  Diagnosis Date   Abnormal Pap smear of cervix    LSISL, colposcopy 2012   Asthma    inhaler prescribed by PCP, pt states occasional SOB due to asthma   Cellulitis and abscess of buttock 10/22/2019   treated w/ incision and drainage   Depression    no current meds as of 05/28/22   Fibroids    uterine fibroids   Hepatitis B infection    Follows with [facility] for infectious disease, LOV 02/20/22 as of 05/27/22.   HIV (human immunodeficiency virus infection) (HCC) 2005   Follows w/ [facility] for Infectious disease, LOV 02/20/22 as of 05/27/22.   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   Viral pharyngitis 04/17/2018   Migraines 07/18/2017   Hepatitis B infection 11/15/2015   Screening examination for sexually transmitted disease 09/14/2014   Anxiety 01/25/2010   Human immunodeficiency virus (HIV) disease (HCC) 11/11/2008    Past Surgical History:  Procedure Laterality Date   APPENDECTOMY     around 2000   BREAST EXCISIONAL BIOPSY     CESAREAN SECTION  2003   one previous   RADIOACTIVE SEED GUIDED EXCISIONAL BREAST BIOPSY Left 03/04/2019   Procedure: RADIOACTIVE SEED GUIDED EXCISIONAL LEFT BREAST BIOPSY AND EXCISION OF LEFT BREAST MASS;  Surgeon: Almond Lint, MD;  [facility];  Service: General;  Laterality: Left;   VAGINAL HYSTERECTOMY N/A 06/05/2022   Procedure: HYSTERECTOMY VAGINAL;  Surgeon: Warden Fillers, MD;  Location: [facility];  Service: Gynecology;  Laterality: N/A;    OB History     Gravida  2   Para  1   Term  1   Preterm  0   AB  1   Living  1      SAB  1   IAB  0  Ectopic  0   Multiple  0   Live Births  1            Home Medications    Prior to Admission medications   Medication Sig Start Date End Date Taking? Authorizing Provider  albuterol (VENTOLIN HFA) 108 (90 Base) MCG/ACT inhaler Inhale 1-2 puffs into the lungs every 6 (six) hours as needed for wheezing or shortness of breath. Patient taking differently: Inhale 2-3 puffs into the lungs as needed for wheezing or shortness of breath. 06/13/21   Wallis Bamberg, PA-C  BIKTARVY 50-200-25 MG TABS tablet TAKE 1 TABLET BY MOUTH 1 TIME A DAY. 09/12/23   Blanchard Kelch, NP  ferrous sulfate (FEROSUL) 325 (65 FE) MG tablet Take 1 tablet by mouth every other day. Patient not taking: Reported on 10/06/2023 06/06/22   [provider]  fluticasone (FLONASE) 50 MCG/ACT nasal spray Place 2 sprays into both nostrils daily. Patient not taking:  Reported on 10/02/2023 09/04/22   Roxy Horseman, PA-C  hydrOXYzine (ATARAX) 25 MG tablet Take 1 tablet (25 mg total) by mouth every 8 (eight) hours as needed for anxiety. 04/21/23   Blanchard Kelch, NP  ibuprofen (ADVIL) 600 MG tablet TAKE 1 TABLET(600 MG) BY MOUTH EVERY 6 HOURS AS NEEDED 04/17/23   Warden Fillers, MD  megestrol (MEGACE ES) 625 MG/5ML suspension SHAKE LIQUID AND TAKE 5 ML(625 MG) BY MOUTH DAILY 09/13/22   Blanchard Kelch, NP    Family History Family History  Problem Relation Age of Onset   Uterine cancer Maternal Aunt    Lung cancer Maternal Grandmother    Liver disease Mother    HIV Mother    Drug abuse Mother    HIV Father    Diabetes Father    Drug abuse Father    Healthy Brother    Breast cancer Paternal Aunt    Stomach cancer Paternal Grandmother    Healthy Sister    Healthy Brother    Healthy Sister    Healthy Daughter    Colon cancer Neg Hx     Social History Social History   Tobacco Use   Smoking status: Every Day    Current packs/day: 0.33    Average packs/day: 0.3 packs/day for 15.0 years (5.0 ttl pk-yrs)    Types: Cigarettes   Smokeless tobacco: Never   Tobacco comments:    Patient quit smoking for a couple of years and then started back.  Vaping Use   Vaping status: Former   Substances: Nicotine, THC, CBD, Mixture of cannabinoids  Substance Use Topics   Alcohol use: Yes    Alcohol/week: 10.0 standard drinks of alcohol    Types: 10 Cans of beer per week    Comment: 2 beers/day   Drug use: Not Currently     Allergies   Hydrocodone-acetaminophen and Kiwi extract   Review of Systems Review of Systems  Constitutional:  Positive for chills and fatigue.  HENT:  Positive for congestion, rhinorrhea, sinus pressure and sinus pain.   Respiratory:  Positive for cough.   Cardiovascular: Negative.   Gastrointestinal: Negative.   Genitourinary: Negative.   Musculoskeletal: Negative.   Psychiatric/Behavioral: Negative.       Physical  Exam Triage Vital Signs ED Triage Vitals  Encounter Vitals Group     BP 10/09/23 1038 131/81     Systolic BP Percentile --      Diastolic BP Percentile --      Pulse Rate 10/09/23 1038 73  Resp 10/09/23 1038 (!) 22     Temp 10/09/23 1038 99.1 F (37.3 C)     Temp Source 10/09/23 1038 Oral     SpO2 10/09/23 1038 98 %     Weight --      Height --      Head Circumference --      Peak Flow --      Pain Score 10/09/23 1034 7     Pain Loc --      Pain Education --      Exclude from Growth Chart --    No data found.  Updated Vital Signs BP 131/81 (BP Location: Right Arm)   Pulse 73   Temp 99.1 F (37.3 C) (Oral)   Resp (!) 22   LMP 05/30/2022 Comment: starting spotting  SpO2 98%   Visual Acuity Right Eye Distance:   Left Eye Distance:   Bilateral Distance:    Right Eye Near:   Left Eye Near:    Bilateral Near:     Physical Exam Constitutional:      Appearance: Normal appearance. She is normal weight.  HENT:     Nose: Congestion and rhinorrhea present.     Right Sinus: Maxillary sinus tenderness and frontal sinus tenderness present.     Left Sinus: Maxillary sinus tenderness and frontal sinus tenderness present.     Mouth/Throat:     Mouth: Mucous membranes are moist.     Pharynx: Posterior oropharyngeal erythema present.  Cardiovascular:     Rate and Rhythm: Normal rate and regular rhythm.  Pulmonary:     Effort: Pulmonary effort is normal.     Breath sounds: Normal breath sounds.  Musculoskeletal:     Cervical back: Normal range of motion and neck supple. Tenderness present.  Lymphadenopathy:     Cervical: Cervical adenopathy present.  Neurological:     General: No focal deficit present.     Mental Status: She is alert.  Psychiatric:        Mood and Affect: Mood normal.      UC Treatments / Results  Labs (all labs ordered are listed, but only abnormal results are displayed) Labs Reviewed - No data to display  EKG   Radiology No results  found.  Procedures Procedures (including critical care time)  Medications Ordered in UC Medications - No data to display  Initial Impression / Assessment and Plan / UC Course  I have reviewed the triage vital signs and the nursing notes.  Pertinent labs & imaging results that were available during my care of the patient were reviewed by me and considered in my medical decision making (see chart for details).   Final Clinical Impressions(s) / UC Diagnoses   Final diagnoses:  Acute non-recurrent maxillary sinusitis     Discharge Instructions      You were diagnosed with acute sinus infection.  I have sent out augmentin to treat this.  You may use tylenol/motrin for pain as well, in addition to over the counter medications.  Please return if not improving.     ED Prescriptions     Medication Sig Dispense Auth. Provider   amoxicillin-clavulanate (AUGMENTIN) 875-125 MG tablet Take 1 tablet by mouth every 12 (twelve) hours for 10 days. 20 tablet Jannifer Franklin, MD      PDMP not reviewed this encounter.   Jannifer Franklin, MD 10/09/23 1052

## 2023-10-27 ENCOUNTER — Ambulatory Visit: Payer: BC Managed Care – PPO | Admitting: Infectious Diseases

## 2023-10-27 ENCOUNTER — Encounter: Payer: Self-pay | Admitting: Infectious Diseases

## 2023-10-27 ENCOUNTER — Other Ambulatory Visit: Payer: Self-pay

## 2023-10-27 VITALS — BP 118/82 | HR 73 | Temp 98.3°F | Ht 64.0 in | Wt 133.0 lb

## 2023-10-27 DIAGNOSIS — H938X3 Other specified disorders of ear, bilateral: Secondary | ICD-10-CM

## 2023-10-27 DIAGNOSIS — B20 Human immunodeficiency virus [HIV] disease: Secondary | ICD-10-CM

## 2023-10-27 DIAGNOSIS — B181 Chronic viral hepatitis B without delta-agent: Secondary | ICD-10-CM

## 2023-10-27 DIAGNOSIS — J4521 Mild intermittent asthma with (acute) exacerbation: Secondary | ICD-10-CM

## 2023-10-27 DIAGNOSIS — H938X1 Other specified disorders of right ear: Secondary | ICD-10-CM | POA: Diagnosis not present

## 2023-10-27 DIAGNOSIS — J309 Allergic rhinitis, unspecified: Secondary | ICD-10-CM

## 2023-10-27 DIAGNOSIS — J329 Chronic sinusitis, unspecified: Secondary | ICD-10-CM

## 2023-10-27 DIAGNOSIS — R630 Anorexia: Secondary | ICD-10-CM

## 2023-10-27 MED ORDER — MEGESTROL ACETATE 625 MG/5ML PO SUSP
625.0000 mg | Freq: Every day | ORAL | 3 refills | Status: DC
Start: 2023-10-27 — End: 2024-05-20

## 2023-10-27 MED ORDER — BIKTARVY 50-200-25 MG PO TABS: ORAL_TABLET | ORAL | 11 refills | Status: AC

## 2023-10-27 MED ORDER — ALBUTEROL SULFATE HFA 108 (90 BASE) MCG/ACT IN AERS: 1.0000 | INHALATION_SPRAY | Freq: Four times a day (QID) | RESPIRATORY_TRACT | 3 refills | Status: AC | PRN

## 2023-10-27 NOTE — Progress Notes (Signed)
 Name: Angelica Hopkins  DOB: 1981/05/07  MRN: [id number] PCP: Patient, No Pcp Per      Subjective   Brief Narrative:  Angelica Hopkins is a 43 y.o. female with well controlled HIV, Dx 200, stage 2. Deferred treatment with ART x 5 years.  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   Previous Regimens:  Genvoya 2010 >> suppressed, inconsistent use  Biktarvy 2018 >> suppressed    Genotype:  04/2017 - sensitive     Chief Complaint  Patient presents with   Follow-up    Discussed the use of AI scribe software for clinical note transcription with the patient, who gave verbal consent to proceed.  History of Present Illness   Angelica Hopkins is a 43 year old female who presents with ear pain and fullness.  She experiences significant ear pain and fullness, particularly in the right ear, which worsens when yawning. There is a sensation similar to being underwater, and her ear 'popped so bad' during a flight in November, causing severe pain. She is concerned about eustachian tube dysfunction.  She has a history of frequent sinus infections and childhood ear infections. Currently, she experiences symptoms including fever, green nasal discharge, headache, and dizziness. Over-the-counter medications have been used, but she feels antibiotics are necessary due to the severity of her symptoms.  Severe allergy symptoms are present, described as 'so bad.' She has not started her allergy medication this year and often forgets to take Claritin until she is outside. Some Flonase remains from a previous prescription. Severe symptoms might require Benadryl at night.  She is currently taking Biktarvy and uses an albuterol inhaler, especially with the current pollen levels. She also uses D-Mannose and has requested refills for these medications.  She is a Runner, broadcasting/film/video and expresses significant job dissatisfaction, feeling 'stuck' and overwhelmed by her workload. She describes a  challenging work environment with high expectations and lack of support.      Review of Systems  Constitutional:  Negative for chills and fever.  HENT:  Negative for tinnitus.   Eyes:  Negative for blurred vision and photophobia.  Respiratory:  Negative for cough and sputum production.   Cardiovascular:  Negative for chest pain.  Gastrointestinal:  Negative for diarrhea, nausea and vomiting.  Genitourinary:  Negative for dysuria.  Skin:  Negative for rash.  Neurological:  Negative for headaches.     Past Medical History:  Diagnosis Date   Abnormal Pap smear of cervix    LSISL, colposcopy 2012   Asthma    inhaler prescribed by PCP, pt states occasional SOB due to asthma   Cellulitis and abscess of buttock 10/22/2019   treated w/ incision and drainage   Depression    no current meds as of 05/28/22   Fibroids    uterine fibroids   Hepatitis B infection    Follows with [facility] for infectious disease, LOV 02/20/22 as of 05/27/22.   HIV (human immunodeficiency virus infection) (HCC) 2005   Follows w/ [facility] for Infectious disease, LOV 02/20/22 as of 05/27/22.   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 HIV, worse in winter, only in fingertips   Panic disorder    hx of anxiety, pt states that sometimes her chest hurts when she gets very anxious (05/28/22)   Vitamin D deficiency    Wears glasses     Social History   Tobacco Use  Smoking status: Every Day    Current packs/day: 0.33    Average packs/day: 0.3 packs/day for 15.0 years (5.0 ttl pk-yrs)    Types: Cigarettes   Smokeless tobacco: Never   Tobacco comments:    Patient quit smoking for a couple of years and then started back.  Vaping Use   Vaping status: Former   Substances: Nicotine, THC, CBD, Mixture of cannabinoids  Substance Use Topics   Alcohol use: Yes    Alcohol/week: 10.0 standard drinks of alcohol    Types: 10 Cans of beer per week    Comment: 2  beers/day   Drug use: Not Currently   Outpatient Medications Prior to Visit  Medication Sig Dispense Refill   hydrOXYzine (ATARAX) 25 MG tablet Take 1 tablet (25 mg total) by mouth every 8 (eight) hours as needed for anxiety. 30 tablet 5   ibuprofen (ADVIL) 600 MG tablet TAKE 1 TABLET(600 MG) BY MOUTH EVERY 6 HOURS AS NEEDED 30 tablet 2   albuterol (VENTOLIN HFA) 108 (90 Base) MCG/ACT inhaler Inhale 1-2 puffs into the lungs every 6 (six) hours as needed for wheezing or shortness of breath. (Patient taking differently: Inhale 2-3 puffs into the lungs as needed for wheezing or shortness of breath.) 18 g 3   BIKTARVY 50-200-25 MG TABS tablet TAKE 1 TABLET BY MOUTH 1 TIME A DAY. 30 tablet 2   megestrol (MEGACE ES) 625 MG/5ML suspension SHAKE LIQUID AND TAKE 5 ML(625 MG) BY MOUTH DAILY 450 mL 3   ferrous sulfate (FEROSUL) 325 (65 FE) MG tablet Take 1 tablet by mouth every other day. (Patient not taking: Reported on 10/27/2023)     fluticasone (FLONASE) 50 MCG/ACT nasal spray Place 2 sprays into both nostrils daily. (Patient not taking: Reported on 12/24/2022) 9.9 mL 0   No facility-administered medications prior to visit.   Allergies  Allergen Reactions   Hydrocodone-Acetaminophen Itching    Patient reported   Kiwi Extract Other (See Comments)    Tongue gets numb and tingling.  Other Reaction(s): Other (See Comments)  Numbing    Tongue gets numb and tingling.       OBJECTIVE: Vitals:   10/27/23 1532  BP: 118/82  Pulse: 73  Temp: 98.3 F (36.8 C)  TempSrc: Temporal  SpO2: 98%  Weight: 133 lb (60.3 kg)  Height: 5\' 4"  (1.626 m)   Body mass index is 22.83 kg/m.   Physical Exam Cardiovascular:     Rate and Rhythm: Normal rate and regular rhythm.  Pulmonary:     Effort: Pulmonary effort is normal.     Comments: No shortness of breath detected in conversation.  Neurological:     Mental Status: She is oriented to person, place, and time.  Psychiatric:        Mood and Affect:  Mood normal.        Behavior: Behavior normal.        Thought Content: Thought content normal.        Judgment: Judgment normal.      HIV 1 RNA Quant  Date Value  10/27/2023 NOT DETECTED copies/mL  04/21/2023 Not Detected Copies/mL  08/15/2022 <20 Copies/mL (H)   CD4 T Cell Abs (/uL)  Date Value  04/21/2023 1,060  08/15/2022 1,001  02/20/2022 890    Lab Results  Component Value Date   HAV NON-REACTIVE 05/12/2017   HBEAG NON-REACTIVE 10/27/2023   HEPCAB NON-REACTIVE 05/12/2017   Lab Results  Component Value Date   WBC 6.9 10/27/2023   HGB  13.8 10/27/2023   HCT 42.1 10/27/2023   MCV 89.0 10/27/2023   PLT 296 10/27/2023   Lab Results  Component Value Date   CREATININE 1.13 (H) 10/27/2023   CREATININE 0.75 06/05/2022   CREATININE 0.70 06/05/2022   Lab Results  Component Value Date   ALT 48 (H) 10/27/2023   AST 36 (H) 10/27/2023   GGT 10 02/20/2022   ALKPHOS 27 (L) 01/11/2022   BILITOT 0.3 10/27/2023    Assessment and Plan    Eustachian tube dysfunction Ear fullness, pain, and popping, particularly in the right ear, worsened during a flight in November, suggest eustachian tube dysfunction, possibly related to sinus issues. Frequent sinus and childhood ear infections noted. No current indication for ear tubes. - Refer to ENT for evaluation of frequent sinus infections and ear fullness - Provide contact information for Dr. Jodean Lima and Dr. Pollyann Kennedy for ENT consultation  Allergic rhinitis Severe allergy symptoms, including sinus issues, likely exacerbated by pollen exposure. Inconsistent use of allergy medication may contribute to ongoing symptoms. Discussed the use of Claritin, Flonase, and Benadryl for symptom management. Claritin can be taken twice daily if needed, with caution for dry mouth as a side effect. - Recommend starting Claritin and Flonase for allergy management - Advise taking Claritin twice daily if needed, with caution for dry mouth - Suggest using  Benadryl at night for severe symptoms - Encourage use of sinus rinses and keeping windows closed to reduce allergen exposure  Asthma Requires an albuterol inhaler, especially during high pollen seasons, indicating asthma management needs. Refill needed due to increased pollen exposure. - Refill albuterol inhaler prescription  HIV infection HIV infection with undetectable viral load for years, maintaining good health. Current symptoms are not related to HIV. Previous inappropriate suggestion to consult infectious disease for unrelated symptoms was addressed and corrected. - Continue current HIV management with Biktarvy - Ensure regular follow-up for HIV care  Primary care provider referral Seeking a primary care provider within her insurance network, expressing difficulty finding one. Recommendations provided for potential providers. - Recommend contacting Jarvis Newcomer at [facility] for primary care - Provide information for other potential primary care providers        Meds ordered this encounter  Medications   bictegravir-emtricitabine-tenofovir AF (BIKTARVY) 50-200-25 MG TABS tablet    Sig: TAKE 1 TABLET BY MOUTH 1 TIME A DAY.    Dispense:  30 tablet    Refill:  11    Prescription Type::   Renewal   megestrol (MEGACE ES) 625 MG/5ML suspension    Sig: Take 5 mLs (625 mg total) by mouth daily.    Dispense:  450 mL    Refill:  3    **Patient requests 90 days supply**   albuterol (VENTOLIN HFA) 108 (90 Base) MCG/ACT inhaler    Sig: Inhale 1-2 puffs into the lungs every 6 (six) hours as needed for wheezing or shortness of breath.    Dispense:  18 g    Refill:  3   Orders Placed This Encounter  Procedures   HIV 1 RNA quant-no reflex-bld   Hepatitis B DNA, ultraquantitative, PCR   COMPLETE METABOLIC PANEL WITHOUT GFR   CBC   T-helper cells (CD4) count   Hepatitis B surface antigen   Hepatitis B e antigen   Ambulatory referral to ENT    Referral Priority:   Routine    Referral  Type:   Consultation    Referral Reason:   Specialty Services Required    Referred to  Provider:   Karle Barr, MD    Requested Specialty:   Otolaryngology    Number of Visits Requested:   1   Return in about 6 months (around 04/27/2024).   Rexene Alberts, MSN, NP-C [facility]  [email or web address] Pager: [phone] Office: [phone] [facility] Main Line: [phone]

## 2023-10-27 NOTE — Patient Instructions (Addendum)
 Nice to see you -   Start back the flonase and allergy medication consistently    For ENT -   Su Philomena Doheny, MD Specialties and/or Subspecialties ENT/Otolaryngology, Otorhinolaryngology [facility] [place], [place] Main: [phone]   Jeannett Senior. Pollyann Kennedy, MD Specialties and/or Subspecialties Otorhinolaryngology [facility] [place], [place] Main: [phone]   Give these offices a call to see if they are accepting new patients -   Jarvis Newcomer - [facility]  Address: [address] [place], [place], [place] [address] Phone: [phone]  Jerre Simon [facility] [address] [place],  [place]  [address] Main: [phone]

## 2023-10-29 LAB — COMPLETE METABOLIC PANEL WITHOUT GFR
AG Ratio: 1.4 (calc) (ref 1.0–2.5)
ALT: 48 U/L — ABNORMAL HIGH (ref 6–29)
AST: 36 U/L — ABNORMAL HIGH (ref 10–30)
Albumin: 4.6 g/dL (ref 3.6–5.1)
Alkaline phosphatase (APISO): 36 U/L (ref 31–125)
BUN/Creatinine Ratio: 12 (calc) (ref 6–22)
BUN: 13 mg/dL (ref 7–25)
CO2: 23 mmol/L (ref 20–32)
Calcium: 9.8 mg/dL (ref 8.6–10.2)
Chloride: 105 mmol/L (ref 98–110)
Creat: 1.13 mg/dL — ABNORMAL HIGH (ref 0.50–0.99)
Globulin: 3.3 g/dL (ref 1.9–3.7)
Glucose, Bld: 91 mg/dL (ref 65–99)
Potassium: 4.1 mmol/L (ref 3.5–5.3)
Sodium: 139 mmol/L (ref 135–146)
Total Bilirubin: 0.3 mg/dL (ref 0.2–1.2)
Total Protein: 7.9 g/dL (ref 6.1–8.1)

## 2023-10-29 LAB — T-HELPER CELLS (CD4) COUNT (NOT AT ARMC)
Absolute CD4: 1539 {cells}/uL (ref 490–1740)
CD4 T Helper %: 45 % (ref 30–61)
Total lymphocyte count: 3387 {cells}/uL (ref 850–3900)

## 2023-10-29 LAB — HIV-1 RNA QUANT-NO REFLEX-BLD
HIV 1 RNA Quant: NOT DETECTED {copies}/mL
HIV-1 RNA Quant, Log: NOT DETECTED {Log_copies}/mL

## 2023-10-29 LAB — CBC
HCT: 42.1 % (ref 35.0–45.0)
Hemoglobin: 13.8 g/dL (ref 11.7–15.5)
MCH: 29.2 pg (ref 27.0–33.0)
MCHC: 32.8 g/dL (ref 32.0–36.0)
MCV: 89 fL (ref 80.0–100.0)
MPV: 10.4 fL (ref 7.5–12.5)
Platelets: 296 10*3/uL (ref 140–400)
RBC: 4.73 10*6/uL (ref 3.80–5.10)
RDW: 12 % (ref 11.0–15.0)
WBC: 6.9 10*3/uL (ref 3.8–10.8)

## 2023-10-29 LAB — HEPATITIS B SURFACE ANTIGEN: Hepatitis B Surface Ag: REACTIVE — AB

## 2023-10-29 LAB — HEPATITIS B E ANTIGEN: Hep B E Ag: NONREACTIVE

## 2023-10-29 LAB — HEPATITIS B DNA, ULTRAQUANTITATIVE, PCR
Hepatitis B DNA: NOT DETECTED [IU]/mL
Hepatitis B virus DNA: NOT DETECTED {Log_IU}/mL

## 2023-11-07 NOTE — Addendum Note (Signed)
 Addended by: Blanchard Kelch on: 11/07/2023 10:29 AM   Modules accepted: Orders

## 2023-11-25 ENCOUNTER — Other Ambulatory Visit: Payer: Self-pay | Admitting: Infectious Diseases

## 2023-11-25 DIAGNOSIS — Z1231 Encounter for screening mammogram for malignant neoplasm of breast: Secondary | ICD-10-CM

## 2023-12-02 ENCOUNTER — Ambulatory Visit (HOSPITAL_COMMUNITY)
Admission: RE | Admit: 2023-12-02 | Discharge: 2023-12-02 | Disposition: A | Discharge status: Home / Self Care | Source: Ambulatory Visit | Attending: Infectious Diseases | Admitting: Infectious Diseases

## 2023-12-02 DIAGNOSIS — B181 Chronic viral hepatitis B without delta-agent: Secondary | ICD-10-CM | POA: Diagnosis present

## 2024-01-08 ENCOUNTER — Ambulatory Visit

## 2024-01-09 ENCOUNTER — Ambulatory Visit
Admission: RE | Admit: 2024-01-09 | Discharge: 2024-01-09 | Disposition: A | Discharge status: Home / Self Care | Source: Ambulatory Visit | Attending: Infectious Diseases | Admitting: Infectious Diseases

## 2024-01-09 DIAGNOSIS — Z1231 Encounter for screening mammogram for malignant neoplasm of breast: Secondary | ICD-10-CM

## 2024-04-12 ENCOUNTER — Other Ambulatory Visit (HOSPITAL_COMMUNITY): Payer: Self-pay

## 2024-04-14 ENCOUNTER — Other Ambulatory Visit: Payer: Self-pay | Admitting: Pharmacist

## 2024-04-14 DIAGNOSIS — B20 Human immunodeficiency virus [HIV] disease: Secondary | ICD-10-CM

## 2024-04-14 MED ORDER — BIKTARVY 50-200-25 MG PO TABS: 1.0000 | ORAL_TABLET | Freq: Every day | ORAL | Status: AC

## 2024-04-14 NOTE — Progress Notes (Signed)
 Medication Samples have been provided to the patient.  Drug name: Biktarvy         Strength: 50/200/25 mg       Qty: 1 bottle (7 tablets)   LOT: [id number]   Exp.Date: 02/25/26  Dosing instructions: Take one tablet by mouth once daily  The patient has been instructed regarding the correct time, dose, and frequency of taking this medication, including desired effects and most common side effects.   Kynleigh Artz L. Deven Furia, PharmD, BCIDP, AAHIVP, CPP Clinical Pharmacist Practitioner Infectious Diseases Clinical Pharmacist [facility]

## 2024-04-22 ENCOUNTER — Ambulatory Visit: Admitting: Infectious Diseases

## 2024-05-10 ENCOUNTER — Encounter (HOSPITAL_COMMUNITY): Payer: Self-pay

## 2024-05-10 ENCOUNTER — Ambulatory Visit (HOSPITAL_COMMUNITY)
Admission: RE | Admit: 2024-05-10 | Discharge: 2024-05-10 | Disposition: A | Discharge status: Home / Self Care | Payer: Self-pay | Source: Ambulatory Visit | Attending: Family Medicine | Admitting: Family Medicine

## 2024-05-10 VITALS — BP 107/76 | HR 80 | Temp 99.0°F | Resp 16

## 2024-05-10 DIAGNOSIS — R070 Pain in throat: Secondary | ICD-10-CM | POA: Diagnosis not present

## 2024-05-10 DIAGNOSIS — J069 Acute upper respiratory infection, unspecified: Secondary | ICD-10-CM | POA: Diagnosis not present

## 2024-05-10 LAB — POCT RAPID STREP A (OFFICE): Rapid Strep A Screen: NEGATIVE

## 2024-05-10 LAB — POC SOFIA SARS ANTIGEN FIA: SARS Coronavirus 2 Ag: NEGATIVE

## 2024-05-10 MED ORDER — TRAMADOL HCL 50 MG PO TABS: 50.0000 mg | ORAL_TABLET | Freq: Four times a day (QID) | ORAL | 0 refills | Status: DC | PRN

## 2024-05-10 NOTE — ED Triage Notes (Signed)
 Starting Thursday she had chills, Saturday sore throat, body aches, fever (99.9 at home, headache. She has been taking benadryl , tylenol .

## 2024-05-10 NOTE — ED Provider Notes (Signed)
 [facility]    CSN: [id number] Arrival date & time: 05/10/24  1118      History   Chief Complaint Chief Complaint  Patient presents with   Fever    Sore throat for two days, chills/body aches. Headache - Entered by patient   Sore Throat   Chills   Generalized Body Aches    HPI Angelica Hopkins is a 43 y.o. female.    Fever Sore Throat  Here for sore throat and mild nasal congestion and myalgia and chills.  The myalgia and chills began bothering her on at least October 11.  She may have had some myalgia before that on October 9 or 10.  Then her throat became very painful on October 11. She has had only occasional slight cough.  No nausea vomiting or diarrhea.  She has been taking Tylenol , and has not helped the sore throat much  She may have had some fever also.  She takes Biktarvy  for HIV, but her counts have been undetectable for some time.  Past Medical History:  Diagnosis Date   Abnormal Pap smear of cervix    LSISL, colposcopy 2012   Asthma    inhaler prescribed by PCP, pt states occasional SOB due to asthma   Cellulitis and abscess of buttock 10/22/2019   treated w/ incision and drainage   Depression    no current meds as of 05/28/22   Fibroids    uterine fibroids   Hepatitis B infection    Follows with [facility] for infectious disease, LOV 02/20/22 as of 05/27/22.   HIV (human immunodeficiency virus infection) (HCC) 2005   Follows w/ [facility] for Infectious disease, LOV 02/20/22 as of 05/27/22.   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   Viral pharyngitis 04/17/2018   Migraines 07/18/2017   Hepatitis B infection 11/15/2015   Screening examination for sexually transmitted disease 09/14/2014   Anxiety 01/25/2010   Human immunodeficiency virus (HIV) disease (HCC) 11/11/2008    Past Surgical History:  Procedure Laterality Date   APPENDECTOMY     around 2000   BREAST EXCISIONAL BIOPSY     CESAREAN SECTION  2003   one previous   RADIOACTIVE SEED GUIDED EXCISIONAL BREAST BIOPSY Left 03/04/2019   Procedure: RADIOACTIVE SEED GUIDED EXCISIONAL LEFT BREAST BIOPSY AND EXCISION OF LEFT BREAST MASS;  Surgeon: Aron Shoulders, MD;  Location: [facility];  Service: General;  Laterality: Left;   VAGINAL HYSTERECTOMY N/A 06/05/2022   Procedure: HYSTERECTOMY VAGINAL;  Surgeon: Zina Jerilynn LABOR, MD;  Location: [facility];  Service: Gynecology;  Laterality: N/A;    OB History     Gravida  2   Para  1   Term  1   Preterm  0   AB  1   Living  1  SAB  1   IAB  0   Ectopic  0   Multiple  0   Live Births  1            Home Medications    Prior to Admission medications   Medication Sig Start Date End Date Taking? Authorizing Provider  bictegravir-emtricitabine-tenofovir AF (BIKTARVY ) 50-200-25 MG TABS tablet TAKE 1 TABLET BY MOUTH 1 TIME A DAY. 10/27/23  Yes Melvenia Corean SAILOR, NP  traMADol (ULTRAM) 50 MG tablet Take 1 tablet (50 mg total) by mouth every 6 (six) hours as needed (pain). 05/10/24  Yes Vonna Sharlet POUR, MD  albuterol  (VENTOLIN  HFA) 108 (90 Base) MCG/ACT inhaler Inhale 1-2 puffs into the lungs every 6 (six) hours as needed for wheezing or shortness of breath. 10/27/23   Melvenia Corean SAILOR, NP  ferrous sulfate  (FEROSUL) 325 (65 FE) MG tablet Take 1 tablet by mouth every other day. Patient not  taking: Reported on 10/06/2023 06/06/22   [provider]  fluticasone  (FLONASE ) 50 MCG/ACT nasal spray Place 2 sprays into both nostrils daily. Patient not taking: Reported on 12/24/2022 09/04/22   Vicky Charleston, PA-C  hydrOXYzine  (ATARAX ) 25 MG tablet Take 1 tablet (25 mg total) by mouth every 8 (eight) hours as needed for anxiety. 04/21/23   Melvenia Corean SAILOR, NP  megestrol  (MEGACE  ES) 625 MG/5ML suspension Take 5 mLs (625 mg total) by mouth daily. 10/27/23   Melvenia Corean SAILOR, NP    Family History Family History  Problem Relation Age of Onset   Uterine cancer Maternal Aunt    Lung cancer Maternal Grandmother    Liver disease Mother    HIV Mother    Drug abuse Mother    HIV Father    Diabetes Father    Drug abuse Father    Healthy Brother    Breast cancer Paternal Aunt    Stomach cancer Paternal Grandmother    Healthy Sister    Healthy Brother    Healthy Sister    Healthy Daughter    Colon cancer Neg Hx     Social History Social History   Tobacco Use   Smoking status: Every Day    Current packs/day: 0.33    Average packs/day: 0.3 packs/day for 15.0 years (5.0 ttl pk-yrs)    Types: Cigarettes   Smokeless tobacco: Never   Tobacco comments:    Patient quit smoking for a couple of years and then started back.  Vaping Use   Vaping status: Former   Substances: Nicotine, THC, CBD, Mixture of cannabinoids  Substance Use Topics   Alcohol use: Yes    Alcohol/week: 10.0 standard drinks of alcohol    Types: 10 Cans of beer per week    Comment: 2 beers/day   Drug use: Not Currently     Allergies   Hydrocodone-acetaminophen  and Kiwi extract   Review of Systems Review of Systems  Constitutional:  Positive for fever.     Physical Exam Triage Vital Signs ED Triage Vitals  Encounter Vitals Group     BP 05/10/24 1136 107/76     Girls Systolic BP Percentile --      Girls Diastolic BP Percentile --      Boys Systolic BP Percentile --      Boys Diastolic BP  Percentile --      Pulse Rate 05/10/24 1136 80     Resp 05/10/24 1136 16     Temp 05/10/24 1136 99 F (37.2 C)     Temp  Source 05/10/24 1136 Oral     SpO2 05/10/24 1136 98 %     Weight --      Height --      Head Circumference --      Peak Flow --      Pain Score 05/10/24 1134 10     Pain Loc --      Pain Education --      Exclude from Growth Chart --    No data found.  Updated Vital Signs BP 107/76 (BP Location: Left Arm)   Pulse 80   Temp 99 F (37.2 C) (Oral)   Resp 16   LMP 05/30/2022 Comment: starting spotting  SpO2 98%   Visual Acuity Right Eye Distance:   Left Eye Distance:   Bilateral Distance:    Right Eye Near:   Left Eye Near:    Bilateral Near:     Physical Exam Vitals reviewed.  Constitutional:      General: She is not in acute distress.    Appearance: She is not toxic-appearing.  HENT:     Right Ear: Tympanic membrane and ear canal normal.     Left Ear: Tympanic membrane and ear canal normal.     Nose: Congestion present.     Mouth/Throat:     Mouth: Mucous membranes are moist.     Comments: There is mild erythema and 2-3+ hypertrophy of the tonsils, right more than left.  There is no swelling of the soft palate or uvula.  The uvula is in the midline. Eyes:     Extraocular Movements: Extraocular movements intact.     Conjunctiva/sclera: Conjunctivae normal.     Pupils: Pupils are equal, round, and reactive to light.  Cardiovascular:     Rate and Rhythm: Normal rate and regular rhythm.     Heart sounds: No murmur heard. Pulmonary:     Effort: Pulmonary effort is normal. No respiratory distress.     Breath sounds: No stridor. No wheezing, rhonchi or rales.  Musculoskeletal:     Cervical back: Neck supple.  Lymphadenopathy:     Cervical: No cervical adenopathy.  Skin:    Capillary Refill: Capillary refill takes less than 2 seconds.     Coloration: Skin is not jaundiced or pale.  Neurological:     General: No focal deficit present.      Mental Status: She is alert and oriented to person, place, and time.  Psychiatric:        Behavior: Behavior normal.      UC Treatments / Results  Labs (all labs ordered are listed, but only abnormal results are displayed) Labs Reviewed  POC SOFIA SARS ANTIGEN FIA  POCT RAPID STREP A (OFFICE)    EKG   Radiology No results found.  Procedures Procedures (including critical care time)  Medications Ordered in UC Medications - No data to display  Initial Impression / Assessment and Plan / UC Course  I have reviewed the triage vital signs and the nursing notes.  Pertinent labs & imaging results that were available during my care of the patient were reviewed by me and considered in my medical decision making (see chart for details).     COVID test Rapid strep is negative.  Throat culture is sent and we will notify and treat protocol if that is positive  Tramadol to pharmacy since she cannot take anti-inflammatories with her being on Biktarvy  and Tylenol  has been ineffective.  Her last fill of anything controlled in [place]   was in November 2023.   Final Clinical Impressions(s) / UC Diagnoses   Final diagnoses:  Throat pain  Viral URI     Discharge Instructions      Your strep test is negative.  Culture of the throat will be sent, and staff will notify you if that is in turn positive.  COVID test was also negative  Take tramadol 50 mg-- 1 tablet every 6 hours as needed for pain.  This medication can make you sleepy or dizzy  You can continue to take Tylenol  as needed for any fever.  Make sure you are getting enough fluids in.    Cepacol spray over-the-counter may also be helpful.     ED Prescriptions     Medication Sig Dispense Auth. Provider   traMADol (ULTRAM) 50 MG tablet Take 1 tablet (50 mg total) by mouth every 6 (six) hours as needed (pain). 12 tablet Yumi Insalaco K, MD      I have reviewed the PDMP during this encounter.    Vonna Sharlet POUR, MD 05/10/24 [phone]

## 2024-05-10 NOTE — Discharge Instructions (Addendum)
 Your strep test is negative.  Culture of the throat will be sent, and staff will notify you if that is in turn positive.  COVID test was also negative  Take tramadol 50 mg-- 1 tablet every 6 hours as needed for pain.  This medication can make you sleepy or dizzy  You can continue to take Tylenol  as needed for any fever.  Make sure you are getting enough fluids in.    Cepacol spray over-the-counter may also be helpful.

## 2024-05-11 ENCOUNTER — Telehealth: Admitting: Physician Assistant

## 2024-05-11 DIAGNOSIS — J028 Acute pharyngitis due to other specified organisms: Secondary | ICD-10-CM

## 2024-05-11 DIAGNOSIS — B9789 Other viral agents as the cause of diseases classified elsewhere: Secondary | ICD-10-CM

## 2024-05-11 MED ORDER — LIDOCAINE VISCOUS HCL 2 % MT SOLN: 15.0000 mL | OROMUCOSAL | 0 refills | Status: DC | PRN

## 2024-05-11 NOTE — Progress Notes (Signed)
 We are sorry that you are not feeling well.  Here is how we plan to help!  Your symptoms indicate a likely viral infection (Pharyngitis).   Pharyngitis is inflammation in the back of the throat which can cause a sore throat, scratchiness and sometimes difficulty swallowing.   Pharyngitis is typically caused by a respiratory virus and will just run its course.  Please keep in mind that your symptoms could last up to 10 days.    For throat pain, we recommend over the counter oral pain relief medications such as acetaminophen  or aspirin, or anti-inflammatory medications such as ibuprofen  or naproxen  sodium.  Topical treatments such as oral throat lozenges or sprays may be used as needed. For throat pain, I have prescribed a Viscous Lidocaine  2% solution. Swallow 5-10 mL every 4-6 hours as needed for sore throat. DO NOT eat or drink anything for 15-20 minutes after swallowing to allow the medication to coat the throat.Angelica Hopkins  Avoid close contact with loved ones, especially the very young and elderly.  Remember to wash your hands thoroughly throughout the day as this is the number one way to prevent the spread of infection! We also recommend that you periodically wipe down door knobs and counters with disinfectant.  After careful review of your answers and review of test results from yesterday's Urgent Care evaluation, I would not recommend and antibiotic for your condition.  Antibiotics should not be used to treat conditions that we suspect are caused by viruses like the virus that causes the common cold or flu.  Providers prescribe antibiotics to treat infections caused by bacteria. Antibiotics are very powerful in treating bacterial infections when they are used properly.  To maintain their effectiveness, they should be used only when necessary.  Overuse of antibiotics has resulted in the development of super bugs that are resistant to treatment!      Home Care: Only take medications as instructed by your  medical team. Do not drink alcohol while taking these medications. A steam or ultrasonic humidifier can help congestion.  You can place a towel over your head and breathe in the steam from hot water coming from a faucet. Avoid close contacts especially the very young and the elderly. Cover your mouth when you cough or sneeze. Always remember to wash your hands.  Get Help Right Away If: You develop worsening fever or throat pain. You develop a severe head ache or visual changes. Your symptoms persist after you have completed your treatment plan.  Make sure you Understand these instructions. Will watch your condition. Will get help right away if you are not doing well or get worse.  Your e-visit answers were reviewed by a board certified advanced clinical practitioner to complete your personal care plan.  Depending on the condition, your plan could have included both over the counter or prescription medications.  If there is a problem please reply once you have received a response from your provider.  Your safety is important to us .  If you have drug allergies check your prescription carefully.    You can use MyChart to ask questions about todays visit, request a non-urgent call back, or ask for a work or school excuse for 24 hours related to this e-Visit. If it has been greater than 24 hours you will need to follow up with your provider, or enter a new e-Visit to address those concerns.  You will get an e-mail in the next two days asking about your experience.  I hope that  your e-visit has been valuable and will speed your recovery. Thank you for using e-visits.   I have spent 5 minutes in review of e-visit questionnaire, review and updating patient chart, medical decision making and response to patient.   Elsie Velma Lunger, PA-C

## 2024-05-14 ENCOUNTER — Ambulatory Visit (HOSPITAL_COMMUNITY)

## 2024-05-20 ENCOUNTER — Encounter: Payer: Self-pay | Admitting: Infectious Diseases

## 2024-05-20 ENCOUNTER — Ambulatory Visit: Admitting: Infectious Diseases

## 2024-05-20 ENCOUNTER — Other Ambulatory Visit: Payer: Self-pay

## 2024-05-20 VITALS — BP 119/76 | HR 69 | Temp 98.5°F | Wt 128.0 lb

## 2024-05-20 DIAGNOSIS — G8929 Other chronic pain: Secondary | ICD-10-CM

## 2024-05-20 DIAGNOSIS — B181 Chronic viral hepatitis B without delta-agent: Secondary | ICD-10-CM | POA: Diagnosis not present

## 2024-05-20 DIAGNOSIS — Z79899 Other long term (current) drug therapy: Secondary | ICD-10-CM

## 2024-05-20 DIAGNOSIS — B20 Human immunodeficiency virus [HIV] disease: Secondary | ICD-10-CM

## 2024-05-20 DIAGNOSIS — M25561 Pain in right knee: Secondary | ICD-10-CM | POA: Diagnosis not present

## 2024-05-20 DIAGNOSIS — R630 Anorexia: Secondary | ICD-10-CM

## 2024-05-20 MED ORDER — MEGESTROL ACETATE 625 MG/5ML PO SUSP: 625.0000 mg | Freq: Every day | ORAL | 5 refills | Status: AC

## 2024-05-20 NOTE — Progress Notes (Signed)
 Name: Senie Lanese  DOB: 01/12/1981  MRN: [id number] PCP: Patient, No Pcp Per      Subjective   Brief Narrative:  Mafalda Mcginniss is a 43 y.o. female with well controlled HIV, Dx 200, stage 2. Deferred treatment with ART x 5 years.  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   Previous Regimens:  Genvoya 2010 >> suppressed, inconsistent use  Biktarvy  2018 >> suppressed    Genotype:  04/2017 - sensitive     Chief Complaint  Patient presents with   Follow-up    B20    Discussed the use of AI scribe software for clinical note transcription with the patient, who gave verbal consent to proceed.  History of Present Illness   Jessyka Austria is a 43 year old female with HIV and hepatitis B who presents for routine follow-up care.  She has been experiencing right knee pain localized to the knee joint, occasionally radiating down the leg. The pain exacerbates with prolonged sitting or certain movements. She has popping sensation and loud noise. She has previously used lidocaine  patches for pain, which provided some relief.      Review of Systems  Constitutional:  Negative for chills and fever.  HENT:  Negative for tinnitus.   Eyes:  Negative for blurred vision and photophobia.  Respiratory:  Negative for cough and sputum production.   Cardiovascular:  Negative for chest pain.  Gastrointestinal:  Negative for diarrhea, nausea and vomiting.  Genitourinary:  Negative for dysuria.  Skin:  Negative for rash.  Neurological:  Negative for headaches.     Past Medical History:  Diagnosis Date   Abnormal Pap smear of cervix    LSISL, colposcopy 2012   Asthma    inhaler prescribed by PCP, pt states occasional SOB due to asthma   Cellulitis and abscess of buttock 10/22/2019   treated w/ incision and drainage   Depression    no current meds as of 05/28/22   Fibroids    uterine fibroids   Hepatitis B infection    Follows with [facility] for infectious disease, LOV 02/20/22 as of 05/27/22.   HIV (human immunodeficiency virus infection) (HCC) 2005   Follows w/ [facility] for Infectious disease, LOV 02/20/22 as of 05/27/22.   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 HIV, worse in winter, only in fingertips   Panic disorder    hx of anxiety, pt states that sometimes her chest hurts when she gets very anxious (05/28/22)   Vitamin D  deficiency    Wears glasses     Social History   Tobacco Use   Smoking status: Every Day    Current packs/day: 0.33    Average packs/day: 0.3 packs/day for 15.0 years (5.0 ttl pk-yrs)    Types: Cigarettes   Smokeless tobacco: Never   Tobacco comments:    Patient quit smoking for a couple of years and then started back.  Vaping Use   Vaping status: Former   Substances: Nicotine, THC, CBD, Mixture of cannabinoids  Substance Use Topics   Alcohol use: Yes    Alcohol/week: 10.0 standard drinks of alcohol    Types: 10 Cans of beer per week    Comment: 2 beers/day   Drug use: Not Currently   Outpatient Medications Prior to Visit  Medication Sig Dispense Refill   albuterol  (VENTOLIN  HFA) 108 (90 Base) MCG/ACT inhaler Inhale 1-2 puffs into the  lungs every 6 (six) hours as needed for wheezing or shortness of breath. 18 g 3   bictegravir-emtricitabine-tenofovir AF (BIKTARVY ) 50-200-25 MG TABS tablet TAKE 1 TABLET BY MOUTH 1 TIME A DAY. 30 tablet 11   lidocaine  (XYLOCAINE ) 2 % solution Use as directed 15 mLs in the mouth or throat as needed for mouth pain. 200 mL 0   traMADol (ULTRAM) 50 MG tablet Take 1 tablet (50 mg total) by mouth every 6 (six) hours as needed (pain). 12 tablet 0   ferrous sulfate  (FEROSUL) 325 (65 FE) MG tablet Take 1 tablet by mouth every other day. (Patient not taking: Reported on 10/06/2023)     fluticasone  (FLONASE ) 50 MCG/ACT nasal spray Place 2 sprays into both nostrils daily. (Patient not taking: Reported on 12/24/2022) 9.9 mL 0    hydrOXYzine  (ATARAX ) 25 MG tablet Take 1 tablet (25 mg total) by mouth every 8 (eight) hours as needed for anxiety. 30 tablet 5   megestrol  (MEGACE  ES) 625 MG/5ML suspension Take 5 mLs (625 mg total) by mouth daily. 450 mL 3   No facility-administered medications prior to visit.   Allergies  Allergen Reactions   Hydrocodone-Acetaminophen  Itching    Patient reported   Kiwi Extract Other (See Comments)    Tongue gets numb and tingling.  Other Reaction(s): Other (See Comments)  Numbing    Tongue gets numb and tingling.       OBJECTIVE: Vitals:   05/20/24 1515  BP: 119/76  Pulse: 69  Temp: 98.5 F (36.9 C)  TempSrc: Oral  SpO2: 99%  Weight: 128 lb (58.1 kg)   Body mass index is 21.97 kg/m.   Physical Exam Cardiovascular:     Rate and Rhythm: Normal rate and regular rhythm.  Pulmonary:     Effort: Pulmonary effort is normal.     Comments: No shortness of breath detected in conversation.  Neurological:     Mental Status: She is oriented to person, place, and time.  Psychiatric:        Mood and Affect: Mood normal.        Behavior: Behavior normal.        Thought Content: Thought content normal.        Judgment: Judgment normal.      HIV 1 RNA Quant  Date Value  10/27/2023 NOT DETECTED copies/mL  04/21/2023 Not Detected Copies/mL  08/15/2022 <20 Copies/mL (H)   CD4 T Cell Abs (/uL)  Date Value  04/21/2023 1,060  08/15/2022 1,001  02/20/2022 890    Lab Results  Component Value Date   HAV NON-REACTIVE 05/12/2017   HBEAG NON-REACTIVE 10/27/2023   HEPCAB NON-REACTIVE 05/12/2017   Lab Results  Component Value Date   WBC 6.9 10/27/2023   HGB 13.8 10/27/2023   HCT 42.1 10/27/2023   MCV 89.0 10/27/2023   PLT 296 10/27/2023   Lab Results  Component Value Date   CREATININE 1.13 (H) 10/27/2023   CREATININE 0.75 06/05/2022   CREATININE 0.70 06/05/2022   Lab Results  Component Value Date   ALT 48 (H) 10/27/2023   AST 36 (H) 10/27/2023   GGT 10  02/20/2022   ALKPHOS 27 (L) 01/11/2022   BILITOT 0.3 10/27/2023    Assessment and Plan    Human immunodeficiency virus (HIV) disease - Continues on Biktarvy  with no new symptoms or complications. Will update routine labs today and have her back in 6 months  - Continue Biktarvy   - VL / CD4 today - CMP and CBC today  -  Deferred flu vaccine.  - Age-appropriate screenings up to date - mammogram and colonoscopy to start at 43 yo  Chronic hepatitis B infection Doing well and labs recently looked great.  - Continue Biktarvy  daily  - Hep b DNA and cmp today   Right knee pain, possible meniscus injury or osteoarthritis Chronic right knee pain exacerbated by prolonged sitting and certain movements. Differential includes meniscus injury and osteoarthritis, supported by family history of knee replacement. - Order x-ray of right knee to evaluate for arthritis or other structural issues. - Refer to sports medicine for further evaluation and management, including potential ultrasound and steroid injections.   Poor Appetite -  Refill Megace  150 mL bottle        Meds ordered this encounter  Medications   megestrol  (MEGACE  ES) 625 MG/5ML suspension    Sig: Take 5 mLs (625 mg total) by mouth daily.    Dispense:  150 mL    Refill:  5   Orders Placed This Encounter  Procedures   DG Knee 1-2 Views Right    Standing Status:   Future    Expiration Date:   05/20/2025    Reason for Exam (SYMPTOM  OR DIAGNOSIS REQUIRED):   knee pain and locking up    Is patient pregnant?:   No    Preferred imaging location?:   [facility]   HIV 1 RNA quant-no reflex-bld   T-helper cells (CD4) count   Hepatitis B DNA, ultraquantitative, PCR   COMPLETE METABOLIC PANEL WITHOUT GFR   CBC w/Diff   AMB referral to sports medicine    Referral Priority:   Routine    Referral Type:   Consultation    Number of Visits Requested:   1   Return in about 6 months (around 11/18/2024).   Corean Fireman, MSN,  NP-C [facility]  [email or web address] Pager: [phone] Office: [phone] [facility] Main Line: [phone]

## 2024-05-20 NOTE — Patient Instructions (Addendum)
  [address] for [employer] of your knee  [phone]) [phone]  Would recommend we do a sports medicine referral  [facility] at [facility] (they have primary care there too)  Address: [address], [place], [place] [address] Phone: [phone]  See if Dr. Harlene Hope or Dr. Almarie Cleveland are taking new patients   Diclofenac - topical treatment for your knee pain

## 2024-05-21 ENCOUNTER — Ambulatory Visit: Payer: Self-pay | Admitting: Infectious Diseases

## 2024-05-21 LAB — T-HELPER CELLS (CD4) COUNT (NOT AT ARMC)
CD4 % Helper T Cell: 44 % (ref 33–65)
CD4 T Cell Abs: 1216 /uL (ref 400–1790)

## 2024-05-22 LAB — CBC WITH DIFFERENTIAL/PLATELET
Absolute Lymphocytes: 3101 {cells}/uL (ref 850–3900)
Absolute Monocytes: 299 {cells}/uL (ref 200–950)
Basophils Absolute: 27 {cells}/uL (ref 0–200)
Basophils Relative: 0.4 %
Eosinophils Absolute: 48 {cells}/uL (ref 15–500)
Eosinophils Relative: 0.7 %
HCT: 40 % (ref 35.0–45.0)
Hemoglobin: 12.9 g/dL (ref 11.7–15.5)
MCH: 28.9 pg (ref 27.0–33.0)
MCHC: 32.3 g/dL (ref 32.0–36.0)
MCV: 89.7 fL (ref 80.0–100.0)
MPV: 10 fL (ref 7.5–12.5)
Monocytes Relative: 4.4 %
Neutro Abs: 3325 {cells}/uL (ref 1500–7800)
Neutrophils Relative %: 48.9 %
Platelets: 280 Thousand/uL (ref 140–400)
RBC: 4.46 Million/uL (ref 3.80–5.10)
RDW: 12.1 % (ref 11.0–15.0)
Total Lymphocyte: 45.6 %
WBC: 6.8 Thousand/uL (ref 3.8–10.8)

## 2024-05-22 LAB — HEPATITIS B DNA, ULTRAQUANTITATIVE, PCR
Hepatitis B DNA: <10 [IU]/mL — AB
Hepatitis B virus DNA: <1 {Log_IU}/mL — AB

## 2024-05-22 LAB — COMPLETE METABOLIC PANEL WITHOUT GFR
AG Ratio: 1.4 (calc) (ref 1.0–2.5)
ALT: 24 U/L (ref 6–29)
AST: 22 U/L (ref 10–30)
Albumin: 4.6 g/dL (ref 3.6–5.1)
Alkaline phosphatase (APISO): 35 U/L (ref 31–125)
BUN: 11 mg/dL (ref 7–25)
CO2: 29 mmol/L (ref 20–32)
Calcium: 9.6 mg/dL (ref 8.6–10.2)
Chloride: 106 mmol/L (ref 98–110)
Creat: 0.95 mg/dL (ref 0.50–0.99)
Globulin: 3.4 g/dL (ref 1.9–3.7)
Glucose, Bld: 81 mg/dL (ref 65–99)
Potassium: 4.6 mmol/L (ref 3.5–5.3)
Sodium: 139 mmol/L (ref 135–146)
Total Bilirubin: 0.3 mg/dL (ref 0.2–1.2)
Total Protein: 8 g/dL (ref 6.1–8.1)

## 2024-05-22 LAB — HIV-1 RNA QUANT-NO REFLEX-BLD
HIV 1 RNA Quant: NOT DETECTED {copies}/mL
HIV-1 RNA Quant, Log: NOT DETECTED {Log_copies}/mL

## 2024-06-02 ENCOUNTER — Other Ambulatory Visit: Payer: Self-pay

## 2024-06-02 ENCOUNTER — Ambulatory Visit: Admitting: Family Medicine

## 2024-06-02 ENCOUNTER — Ambulatory Visit

## 2024-06-02 ENCOUNTER — Encounter: Payer: Self-pay | Admitting: Family Medicine

## 2024-06-02 VITALS — BP 116/74 | HR 82 | Ht 64.0 in | Wt 124.0 lb

## 2024-06-02 DIAGNOSIS — G8929 Other chronic pain: Secondary | ICD-10-CM | POA: Diagnosis not present

## 2024-06-02 DIAGNOSIS — M25561 Pain in right knee: Secondary | ICD-10-CM

## 2024-06-02 MED ORDER — IBUPROFEN 800 MG PO TABS: 800.0000 mg | ORAL_TABLET | Freq: Three times a day (TID) | ORAL | 1 refills | Status: AC | PRN

## 2024-06-02 NOTE — Progress Notes (Unsigned)
 I, Leotis Batter, CMA acting as a scribe for Artist Lloyd, MD.  Angelica Hopkins is a 43 y.o. female who presents to [facility] at [facility] today for R knee pain x 10 years, worsening over the past 6 months. Pt locates pain to deep to patella and, more recently, at posterior aspect.. Sx worse with sti-to-stand, feels locked up and achy after prolonged sitting. Feels the urge to pop the knee, typically feels better after the knee pops. Will occasionally have a painful pop. No relief with Tylenol , some improvement with IBU 800 mg. Limited ROM with knee flexion.   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   Exam:  BP 116/74   Pulse 82   Ht 5' 4 (1.626 m)   Wt 124 lb (56.2 kg)   LMP 05/30/2022 Comment: starting spotting  SpO2 98%   BMI 21.28 kg/m  General: Well Developed, well nourished, and in no acute distress.   MSK: Right knee mild effusion normal-appearing otherwise normal motion.  Stable ligamentous exam intact strength.    Lab and Radiology Results  Procedure: Real-time Ultrasound Guided Injection of right knee joint superior lateral patella space Device: Philips Affiniti 50G/GE Logiq Images permanently stored and available for review in PACS Verbal informed consent obtained.  Discussed risks and benefits of procedure. Warned about infection, bleeding, hyperglycemia damage to structures among others. Patient expresses understanding and agreement Time-out conducted.   Noted no overlying erythema, induration, or other signs of local infection.   Skin prepped in a sterile fashion.   Local anesthesia: Topical Ethyl chloride.   With sterile technique and under real time ultrasound  guidance: 40 mg of Kenalog and 2 mL of Marcaine  injected into knee joint. Fluid seen entering the joint capsule.   Completed without difficulty   Pain immediately resolved suggesting accurate placement of the medication.   Advised to call if fevers/chills, erythema, induration, drainage, or persistent bleeding.   Images permanently stored and available for review in the ultrasound unit.  Impression: Technically successful ultrasound guided injection.       X-ray images right knee obtained today personally and independently interpreted. Minimal degenerative changes no acute fracture. Await formal radiology review     Assessment and Plan: 43 y.o. 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  Plan for steroid injection and physical therapy. Recommend also Voltaren gel.  I did prescribe ibuprofen  which she has used in the past.  Recommend using it sparingly with her HIV medication.  She expresses understanding and agreement. Recheck back as needed.  PDMP not reviewed this encounter. Orders Placed This Encounter  Procedures   US  LIMITED JOINT SPACE STRUCTURES LOW RIGHT(NO LINKED CHARGES)    Reason for Exam (SYMPTOM  OR DIAGNOSIS REQUIRED):   right knee pain    Preferred imaging location?:   [facility] Knee AP/LAT W/[place] Right    Standing Status:   Future    Number of Occurrences:   1    Expiration Date:   07/02/2024    Reason for Exam (SYMPTOM  OR DIAGNOSIS REQUIRED):   right knee pain    Preferred imaging location?:   [facility]    Is patient pregnant?:   No   Ambulatory  referral to Physical Therapy    Referral Priority:   Routine    Referral Type:   Physical Medicine    Referral Reason:   Specialty Services Required    Requested Specialty:   Physical Therapy    Number of Visits Requested:   1   Meds ordered this encounter  Medications   ibuprofen  (ADVIL ) 800 MG tablet    Sig: Take  1 tablet (800 mg total) by mouth every 8 (eight) hours as needed.    Dispense:  30 tablet    Refill:  1     Discussed warning signs or symptoms. Please see discharge instructions. Patient expresses understanding.   The above documentation has been reviewed and is accurate and complete Artist Lloyd, M.D.

## 2024-06-02 NOTE — Patient Instructions (Addendum)
 Thank you for coming in today.   You received an injection today. Seek immediate medical attention if the joint becomes red, extremely painful, or is oozing fluid.   Please get an Xray today before you leave   Please use Voltaren gel (Generic Diclofenac Gel) up to 4x daily for pain as needed.  This is available over-the-counter as both the name brand Voltaren gel and the generic diclofenac gel.   RX sent in for IBU 800 mg to take prn  A referral for physical therapy has been submitted. A representative from the physical therapy office will contact you to coordinate scheduling after confirming your benefits with your insurance provider. If you do not hear from the physical therapy office within the next 1-2 weeks, please let us  know.   See you back as needed.

## 2024-06-08 ENCOUNTER — Ambulatory Visit: Payer: Self-pay | Admitting: Family Medicine

## 2024-06-08 NOTE — Progress Notes (Signed)
Right knee x-ray looks okay to radiology.

## 2024-06-16 NOTE — Therapy (Incomplete)
 OUTPATIENT PHYSICAL THERAPY LOWER EXTREMITY EVALUATION   Patient Name: Angelica Hopkins MRN: [id number] DOB:12-16-1980, 43 y.o., female Today's Date: 06/16/2024  END OF SESSION:   Past Medical History:  Diagnosis Date   Abnormal Pap smear of cervix    LSISL, colposcopy 2012   Asthma    inhaler prescribed by PCP, pt states occasional SOB due to asthma   Cellulitis and abscess of buttock 10/22/2019   treated w/ incision and drainage   Depression    no current meds as of 05/28/22   Fibroids    uterine fibroids   Hepatitis B infection    Follows with [facility] for infectious disease, LOV 02/20/22 as of 05/27/22.   HIV (human immunodeficiency virus infection) (HCC) 2005   Follows w/ [facility] for Infectious disease, LOV 02/20/22 as of 05/27/22.   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 HIV, worse in winter, only in fingertips   Panic disorder    hx of anxiety, pt states that sometimes her chest hurts when she gets very anxious (05/28/22)   Vitamin D  deficiency    Wears glasses    Past Surgical History:  Procedure Laterality Date   APPENDECTOMY     around 2000   BREAST EXCISIONAL BIOPSY     CESAREAN SECTION  2003   one previous   RADIOACTIVE SEED GUIDED EXCISIONAL BREAST BIOPSY Left 03/04/2019   Procedure: RADIOACTIVE SEED GUIDED EXCISIONAL LEFT BREAST BIOPSY AND EXCISION OF LEFT BREAST MASS;  Surgeon: Aron Shoulders, MD;  Location: [facility];  Service: General;  Laterality: Left;   VAGINAL HYSTERECTOMY N/A 06/05/2022   Procedure: HYSTERECTOMY VAGINAL;  Surgeon: Zina Jerilynn LABOR, MD;  Location: [facility];  Service: Gynecology;  Laterality: N/A;   Patient Active Problem List   Diagnosis Date Noted   Influenza-like illness 10/06/2023   Sleep talking 04/21/2023   Snoring 04/21/2023   Poor appetite 08/16/2022   S/P vaginal hysterectomy 06/05/2022   Cervical stenosis (uterine cervix)  12/10/2021   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  Rationale for Evaluation and Treatment: {HABREHAB:27488}  ONSET DATE: ***  SUBJECTIVE:   SUBJECTIVE STATEMENT: ***  PERTINENT HISTORY: *** PAIN:  Are you having pain? Yes: NPRS scale: *** Pain location: *** Pain description: *** Aggravating factors: *** Relieving factors: ***  PRECAUTIONS: {Therapy precautions:24002}  RED FLAGS: {PT Red Flags:29287}   WEIGHT BEARING RESTRICTIONS: {Yes ***/No:24003}  FALLS:  Has patient fallen in last 6 months? {fallsyesno:27318}  LIVING ENVIRONMENT: Lives with: {[facility] lives with:25569::lives with their family} Lives in: {Lives in:25570} Stairs: {opstairs:27293} Has following equipment at home: {Assistive devices:23999}  OCCUPATION: ***  PLOF: {PLOF:24004}  PATIENT GOALS: ***  NEXT MD VISIT: ***  OBJECTIVE:  Note: Objective measures were completed at Evaluation unless otherwise noted.  DIAGNOSTIC FINDINGS: ***  PATIENT SURVEYS:  PSFS: THE PATIENT SPECIFIC FUNCTIONAL SCALE  Place score of 0-10 (0 = unable to perform activity and 10 = able to perform activity at the same level as  before injury or problem)  Activity Date: 06/18/24         2.     3.     4.      Total Score ***      Total Score = Sum of activity scores/number of activities  Minimally Detectable Change: 3 points (for single activity); 2 points (for average score)  [facility] (nd). The Patient Specific Functional Scale . Retrieved from  [email or web address]   COGNITION: Overall cognitive status: Within functional limits for tasks assessed     SENSATION: {sensation:27233}  EDEMA:  {edema:24020}  MUSCLE LENGTH: Hamstrings: Right *** deg; Left *** deg Debby test: Right *** deg; Left *** deg  POSTURE: {posture:25561}  PALPATION: ***  LOWER EXTREMITY ROM:  Active/Passive ROM Right eval Left eval  Hip flexion    Hip extension    Hip abduction    Hip adduction    Hip internal rotation    Hip external rotation    Knee flexion ***   Knee extension    Ankle dorsiflexion    Ankle plantarflexion    Ankle inversion    Ankle eversion     (Blank rows = not tested)  LOWER EXTREMITY MMT:  MMT Right eval Left eval  Hip flexion    Hip extension    Hip abduction    Hip adduction    Hip internal rotation    Hip external rotation    Knee flexion ***   Knee extension    Ankle dorsiflexion    Ankle plantarflexion    Ankle inversion    Ankle eversion     (Blank rows = not tested)  LOWER EXTREMITY SPECIAL TESTS:  Knee special tests: ***  FUNCTIONAL TESTS:  5 times sit to stand: ***  GAIT: Distance walked: *** Assistive device utilized: {Assistive devices:23999} Level of assistance: {Levels of assistance:24026} Comments: ***                                                                                                                                TREATMENT DATE: ***    PATIENT EDUCATION:  Education details: *** Person educated: {Person educated:25204} Education method: {Education Method:25205} Education comprehension: {Education Comprehension:25206}  HOME EXERCISE PROGRAM: ***  ASSESSMENT:  CLINICAL IMPRESSION: Patient is a 43 y.o. female who was seen today for physical therapy evaluation and treatment for R knee pain and restriction. She presents with *** Patient would benefit from skilled PT services to address above deficits and  return to previous LOA.  OBJECTIVE IMPAIRMENTS: {opptimpairments:25111}.   ACTIVITY LIMITATIONS: {activitylimitations:27494}  PARTICIPATION LIMITATIONS: {participationrestrictions:25113}  PERSONAL FACTORS: {Personal factors:25162} are also affecting patient's functional outcome.   REHAB POTENTIAL: {rehabpotential:25112}  CLINICAL DECISION MAKING: Stable/uncomplicated  EVALUATION COMPLEXITY: {Evaluation complexity:25115}   GOALS: Goals reviewed with patient? Yes  SHORT TERM GOALS: Target date: *** Pt to be independent with hEP. Baseline: Goal status: INITIAL  2.  Decrease pain by 1 level. Baseline:  Goal status: INITIAL  3.  *** Baseline:  Goal status: INITIAL  LONG TERM GOALS: Target date: ***  Pt to be independent with self progressive HEP.   Baseline:  Goal status: INITIAL  2.  Decrease pain to max ***2/10 with all activities. Baseline:  Goal status: INITIAL  3.  Increase strength by 1/2 grade to 1 full grade in R LE.  Baseline:  Goal status: INITIAL  4.  Increase ROM by*** Baseline:  Goal status: INITIAL  5.  Increase PSFS score by 3 points for measurable difference.   Baseline:  Goal status: INITIAL  6.  *** Baseline:  Goal status: INITIAL   PLAN:  PT FREQUENCY: {rehab frequency:25116}  PT DURATION: {rehab duration:25117}  PLANNED INTERVENTIONS: 97164- PT Re-evaluation, 97110-Therapeutic exercises, 97530- Therapeutic activity, 97112- Neuromuscular re-education, 97535- Self Care, 02859- Manual therapy, [phone]- Gait training, [phone]- Aquatic Therapy, [phone]- Electrical stimulation (unattended), 97016- Vasopneumatic device, Patient/Family education, Balance training, Stair training, Joint mobilization, Cryotherapy, and Moist heat  PLAN FOR NEXT SESSION: ***   Burnard CHRISTELLA Meth, PT 06/16/2024, 10:54 AM

## 2024-06-18 ENCOUNTER — Ambulatory Visit

## 2024-06-30 NOTE — Progress Notes (Signed)
 The ASCVD Risk score (Arnett DK, et al., 2019) failed to calculate for the following reasons:   Cannot find a previous HDL lab   Cannot find a previous total cholesterol lab  Updated next appointment note requesting lipid panel.   Brantlee Hinde, BSN, RN

## 2024-11-24 ENCOUNTER — Ambulatory Visit: Admitting: Infectious Diseases
